# Patient Record
Sex: Female | Born: 1935 | Race: White | Hispanic: No | Smoking: Never smoker
Health system: Southern US, Community
[De-identification: ages and names within clinical notes are randomized; demographics above are authoritative.]

## PROBLEM LIST (undated history)

## (undated) DIAGNOSIS — I10 Essential (primary) hypertension: Secondary | ICD-10-CM

## (undated) HISTORY — PX: BREAST SURGERY: SHX581

## (undated) HISTORY — PX: APPENDECTOMY: SHX54

## (undated) HISTORY — PX: ABDOMINAL HYSTERECTOMY: SHX81

---

## 2008-05-25 ENCOUNTER — Other Ambulatory Visit: Payer: Self-pay

## 2008-05-25 ENCOUNTER — Ambulatory Visit: Payer: Self-pay | Admitting: Internal Medicine

## 2008-05-25 ENCOUNTER — Emergency Department: Payer: Self-pay | Admitting: Emergency Medicine

## 2008-05-29 ENCOUNTER — Other Ambulatory Visit: Payer: Self-pay

## 2008-05-29 ENCOUNTER — Emergency Department: Payer: Self-pay | Admitting: Emergency Medicine

## 2010-08-26 ENCOUNTER — Ambulatory Visit: Payer: Self-pay | Admitting: Family Medicine

## 2010-09-09 ENCOUNTER — Emergency Department: Payer: Self-pay | Admitting: Emergency Medicine

## 2011-09-19 ENCOUNTER — Ambulatory Visit: Payer: Self-pay

## 2012-07-04 ENCOUNTER — Ambulatory Visit: Payer: Self-pay | Admitting: Family Medicine

## 2012-07-04 LAB — URINALYSIS, COMPLETE
Bilirubin,UR: NEGATIVE
Ketone: NEGATIVE
Ph: 6 (ref 4.5–8.0)
Specific Gravity: 1.01 (ref 1.003–1.030)
WBC UR: >30 /HPF (ref 0–5)

## 2012-07-06 LAB — URINE CULTURE

## 2012-11-06 ENCOUNTER — Ambulatory Visit: Payer: Self-pay | Admitting: Internal Medicine

## 2012-11-06 LAB — URINALYSIS, COMPLETE
Ketone: NEGATIVE
Ph: 7.5 (ref 4.5–8.0)
Protein: NEGATIVE
Specific Gravity: 1.005 (ref 1.003–1.030)

## 2013-04-12 ENCOUNTER — Ambulatory Visit: Payer: Self-pay | Admitting: Family Medicine

## 2013-04-12 LAB — URINALYSIS, COMPLETE
Bacteria: NEGATIVE
Glucose,UR: NEGATIVE mg/dL (ref 0–75)
Ketone: NEGATIVE
Ph: 8 (ref 4.5–8.0)
Protein: NEGATIVE
Specific Gravity: 1.01 (ref 1.003–1.030)

## 2013-05-23 ENCOUNTER — Ambulatory Visit: Payer: Self-pay

## 2013-05-23 LAB — URINALYSIS, COMPLETE
Bacteria: NEGATIVE
RBC,UR: NONE SEEN /HPF (ref 0–5)

## 2013-05-25 LAB — URINE CULTURE

## 2013-07-08 ENCOUNTER — Ambulatory Visit: Payer: Self-pay | Admitting: Emergency Medicine

## 2013-07-08 LAB — URINALYSIS, COMPLETE
Bilirubin,UR: NEGATIVE
Glucose,UR: NEGATIVE mg/dL (ref 0–75)
Ketone: NEGATIVE
Ph: 6 (ref 4.5–8.0)

## 2013-07-10 LAB — URINE CULTURE

## 2015-01-05 ENCOUNTER — Emergency Department: Payer: Self-pay | Admitting: Emergency Medicine

## 2015-01-05 LAB — COMPREHENSIVE METABOLIC PANEL
ALBUMIN: 3.6 g/dL (ref 3.4–5.0)
ALK PHOS: 80 U/L
AST: 31 U/L (ref 15–37)
Anion Gap: 9 (ref 7–16)
BUN: 17 mg/dL (ref 7–18)
Bilirubin,Total: 0.4 mg/dL (ref 0.2–1.0)
CALCIUM: 9.3 mg/dL (ref 8.5–10.1)
Chloride: 108 mmol/L — ABNORMAL HIGH (ref 98–107)
Co2: 23 mmol/L (ref 21–32)
Creatinine: 1.04 mg/dL (ref 0.60–1.30)
EGFR (African American): >60
EGFR (Non-African Amer.): 53 — ABNORMAL LOW
Glucose: 120 mg/dL — ABNORMAL HIGH (ref 65–99)
OSMOLALITY: 282 (ref 275–301)
Potassium: 4.3 mmol/L (ref 3.5–5.1)
SGPT (ALT): 22 U/L
Sodium: 140 mmol/L (ref 136–145)
Total Protein: 7.2 g/dL (ref 6.4–8.2)

## 2015-01-05 LAB — CBC
HCT: 36.9 % (ref 35.0–47.0)
HGB: 12.1 g/dL (ref 12.0–16.0)
MCH: 29.8 pg (ref 26.0–34.0)
MCHC: 32.7 g/dL (ref 32.0–36.0)
MCV: 91 fL (ref 80–100)
Platelet: 229 10*3/uL (ref 150–440)
RBC: 4.05 10*6/uL (ref 3.80–5.20)
RDW: 13.3 % (ref 11.5–14.5)
WBC: 4.8 10*3/uL (ref 3.6–11.0)

## 2015-01-05 LAB — TROPONIN I: TROPONIN-I: 0.03 ng/mL

## 2017-12-01 ENCOUNTER — Other Ambulatory Visit: Payer: Self-pay

## 2017-12-01 ENCOUNTER — Ambulatory Visit
Admission: EM | Admit: 2017-12-01 | Discharge: 2017-12-01 | Disposition: A | Discharge status: Home / Self Care | Payer: Medicare Other | Attending: Family Medicine | Admitting: Family Medicine

## 2017-12-01 ENCOUNTER — Encounter: Payer: Self-pay | Admitting: *Deleted

## 2017-12-01 DIAGNOSIS — R040 Epistaxis: Secondary | ICD-10-CM | POA: Diagnosis not present

## 2017-12-01 HISTORY — DX: Essential (primary) hypertension: I10

## 2017-12-01 NOTE — ED Triage Notes (Signed)
Patient started having symptom of nose bleed and sinus congestion 3 days ago. Last nose bleed occurred this AM.  PAtient is presently not bleeding.

## 2017-12-01 NOTE — ED Provider Notes (Addendum)
[facility]    CSN: [id number] Arrival date & time: 12/01/17  1144     History   Chief Complaint Chief Complaint  Patient presents with  . Epistaxis    HPI Jani GravelBernice O Spare is a 83 y.o. female presented to clinic with daughter with cc of nose bleed every morning x 3 days. Nose bleed start when gets up in the morning and use pressure forabout 10-15 min to stop the bleeding. Pt reports had nasal congestion and used Afrin and nose bleed started after that.Denies headache, dizziness, change in vision, CP, SOB. Currently pt not bleeding. A&Ox3, comfortably communicating. No acute visible distress.  The history is provided by the patient.  Epistaxis  Location:  L nare Severity:  Mild Duration:  3 days Timing: once every morning  Progression:  Unchanged Chronicity:  New Relieved by:  Applying pressure Worsened by:  Heat   Past Medical History:  Diagnosis Date  . Hypertension     There are no active problems to display for this patient.   Past Surgical History:  Procedure Laterality Date  . ABDOMINAL HYSTERECTOMY    . APPENDECTOMY    . BREAST SURGERY      OB History    No data available       Home Medications    Prior to Admission medications   Medication Sig Start Date End Date Taking? Authorizing Provider  amLODipine (NORVASC) 10 MG tablet Take 10 mg by mouth daily.   Yes [provider]  carvedilol (COREG) 25 MG tablet Take 25 mg by mouth 2 (two) times daily with a meal.   Yes [provider]  cyanocobalamin 500 MCG tablet Take 500 mcg by mouth daily.   Yes [provider]  hydrALAZINE (APRESOLINE) 50 MG tablet Take 50 mg by mouth 3 (three) times daily.   Yes [provider]  Multiple Vitamin (MULTIVITAMIN) capsule Take 1 capsule by mouth daily.   Yes [provider]  Omega-3 Fatty Acids (FISH OIL PO) Take by mouth daily.   Yes [provider]  omeprazole (PRILOSEC OTC) 20 MG tablet Take 20 mg  by mouth daily.   Yes [provider]    Family History History reviewed. No pertinent family history.  Social History Social History   Tobacco Use  . Smoking status: Never Smoker  . Smokeless tobacco: Never Used  Substance Use Topics  . Alcohol use: No    Frequency: Never  . Drug use: No     Allergies   Aspirin   Review of Systems Review of Systems  Constitutional: Negative.   HENT: Positive for nosebleeds.   Eyes: Negative.   Respiratory: Negative.   Cardiovascular: Negative.   Neurological: Negative.   Hematological: Negative.   Psychiatric/Behavioral: Negative.      Physical Exam Triage Vital Signs ED Triage Vitals  Enc Vitals Group     BP 12/01/17 1159 (!) 155/58     Pulse Rate 12/01/17 1159 73     Resp 12/01/17 1159 16     Temp 12/01/17 1159 97.9 F (36.6 C)     Temp Source 12/01/17 1159 Oral     SpO2 12/01/17 1159 97 %     Weight 12/01/17 1200 134 lb (60.8 kg)     Height 12/01/17 1200 5\' 3"  (1.6 m)     Head Circumference --      Peak Flow --      Pain Score 12/01/17 1203 0     Pain Loc --  Pain Edu? --      Excl. in GC? --    No data found.  Updated Vital Signs BP (!) 155/58 (BP Location: Right Arm)   Pulse 73   Temp 97.9 F (36.6 C) (Oral)   Resp 16   Ht 5\' 3"  (1.6 m)   Wt 134 lb (60.8 kg)   SpO2 97%   BMI 23.74 kg/m   Visual Acuity Right Eye Distance:   Left Eye Distance:   Bilateral Distance:    Right Eye Near:   Left Eye Near:    Bilateral Near:     Physical Exam  Constitutional: She is oriented to person, place, and time. She appears well-developed and well-nourished. No distress.  HENT:  Nose: No mucosal edema, rhinorrhea, sinus tenderness or nasal deformity. Epistaxis (Left nare bleed x 3 days. Currently asymtomatic. Nasal passages dry. No blood or abrasion noted.) is observed.  No foreign bodies.  Eyes: EOM are normal. Pupils are equal, round, and reactive to light.  Neck: Normal range of motion.    Cardiovascular: Normal rate, regular rhythm and normal heart sounds.  Pulmonary/Chest: Effort normal and breath sounds normal.  Neurological: She is alert and oriented to person, place, and time.  Skin: Skin is warm.     UC Treatments / Results  Labs (all labs ordered are listed, but only abnormal results are displayed) Labs Reviewed - No data to display  EKG  EKG Interpretation None       Radiology No results found.  Procedures Procedures (including critical care time)  Medications Ordered in UC Medications - No data to display   Initial Impression / Assessment and Plan / UC Course  I have reviewed the triage vital signs and the nursing notes.  Pertinent labs & imaging results that were available during my care of the patient were reviewed by me and considered in my medical decision making (see chart for details).    Sx highly likely from use of Afrin and heat.  No acute findings on Exam. Will discharge pt home with instructions to use humidification, nasal lubricating gel. If Sx re appear return to clinic for re evaluation. Pt verbalizes understanding.  Final Clinical Impressions(s) / UC Diagnoses   Final diagnoses:  Epistaxis    ED Discharge Orders    None       Controlled Substance Prescriptions [place] Controlled Substance Registry consulted? Not Applicable   Reinaldo RaddleMultani, Annalaura Sauseda, NP 12/01/17 1240    Lanecia Sliva, NP 12/01/17 1241

## 2017-12-01 NOTE — Discharge Instructions (Signed)
Use humidification in bedroom. Use nasal lubricating gel. NO AFRIN or nasal spray. If Sx worsens return to clinic or go to ED.

## 2019-01-11 ENCOUNTER — Other Ambulatory Visit: Payer: Self-pay

## 2019-01-11 ENCOUNTER — Ambulatory Visit
Admission: EM | Admit: 2019-01-11 | Discharge: 2019-01-11 | Disposition: A | Discharge status: Home / Self Care | Payer: Medicare Other | Attending: Family Medicine | Admitting: Family Medicine

## 2019-01-11 DIAGNOSIS — J0100 Acute maxillary sinusitis, unspecified: Secondary | ICD-10-CM

## 2019-01-11 DIAGNOSIS — I10 Essential (primary) hypertension: Secondary | ICD-10-CM

## 2019-01-11 DIAGNOSIS — H109 Unspecified conjunctivitis: Secondary | ICD-10-CM | POA: Diagnosis not present

## 2019-01-11 MED ORDER — DOXYCYCLINE HYCLATE 100 MG PO CAPS: 100.0000 mg | ORAL_CAPSULE | Freq: Two times a day (BID) | ORAL | 0 refills | Status: DC

## 2019-01-11 MED ORDER — POLYMYXIN B-TRIMETHOPRIM 10000-0.1 UNIT/ML-% OP SOLN: 1.0000 [drp] | Freq: Four times a day (QID) | OPHTHALMIC | 0 refills | Status: AC

## 2019-01-11 NOTE — ED Provider Notes (Signed)
[facility]    CSN: [id number] Arrival date & time: 01/11/19  1226  History   Chief Complaint Cough, congestion, sinus pain/pressure, eye redness/discharge  HPI  85 year old female presents with the above complaints.  Patient states that she has been sick for 2 weeks.  She reports cough, sinus pain and pressure, congestion.  She states that her symptoms have failed to improve over the past 2 weeks.  He states that this morning she woke up and her eyes were red and there was discolored discharge.  No documented fever.  No known exacerbating or relieving factors.  Symptoms are severe and persistent.  No other associated symptoms.  No other complaints.  PMH, Surgical Hx, Family Hx, Social History reviewed and updated as below.  Past Medical History:  Diagnosis Date  . Hypertension   GERD  Past Surgical History:  Procedure Laterality Date  . ABDOMINAL HYSTERECTOMY    . APPENDECTOMY    . BREAST SURGERY     OB History   No obstetric history on file.    Home Medications    Prior to Admission medications   Medication Sig Start Date End Date Taking? Authorizing Provider  amLODipine (NORVASC) 10 MG tablet Take 10 mg by mouth daily.   Yes [provider]  carvedilol (COREG) 25 MG tablet Take 25 mg by mouth 2 (two) times daily with a meal.   Yes [provider]  cyanocobalamin 500 MCG tablet Take 500 mcg by mouth daily.   Yes [provider]  hydrALAZINE (APRESOLINE) 50 MG tablet Take 50 mg by mouth 3 (three) times daily.   Yes [provider]  Multiple Vitamin (MULTIVITAMIN) capsule Take 1 capsule by mouth daily.   Yes [provider]  Omega-3 Fatty Acids (FISH OIL PO) Take by mouth daily.   Yes [provider]  omeprazole (PRILOSEC OTC) 20 MG tablet Take 20 mg by mouth daily.   Yes [provider]  Turmeric 400 MG CAPS Take by mouth.   Yes [provider]  doxycycline (VIBRAMYCIN) 100 MG capsule  Take 1 capsule (100 mg total) by mouth 2 (two) times daily. 01/11/19   Tommie Sams, DO  trimethoprim-polymyxin b (POLYTRIM) ophthalmic solution Place 1 drop into both eyes every 6 (six) hours for 5 days. 01/11/19 01/16/19  Tommie Sams, DO   Social History Social History   Tobacco Use  . Smoking status: Never Smoker  . Smokeless tobacco: Never Used  Substance Use Topics  . Alcohol use: No    Frequency: Never  . Drug use: No   Allergies   Aspirin   Review of Systems Review of Systems  Constitutional: Negative for fever.  HENT: Positive for congestion, sinus pressure and sinus pain.   Eyes: Positive for discharge and redness.  Respiratory: Positive for cough.    Physical Exam Triage Vital Signs ED Triage Vitals  Enc Vitals Group     BP 01/11/19 1238 (!) 142/60     Pulse Rate 01/11/19 1238 77     Resp 01/11/19 1238 18     Temp 01/11/19 1238 98.3 F (36.8 C)     Temp Source 01/11/19 1238 Oral     SpO2 01/11/19 1238 98 %     Weight 01/11/19 1236 130 lb (59 kg)     Height 01/11/19 1236 5\' 3"  (1.6 m)     Head Circumference --      Peak Flow --      Pain Score 01/11/19 1236  1     Pain Loc --      Pain Edu? --      Excl. in GC? --    Updated Vital Signs BP (!) 142/60 (BP Location: Right Arm)   Pulse 77   Temp 98.3 F (36.8 C) (Oral)   Resp 18   Ht 5\' 3"  (1.6 m)   Wt 59 kg   SpO2 98%   BMI 23.03 kg/m   Visual Acuity Right Eye Distance:   Left Eye Distance:   Bilateral Distance:    Right Eye Near:   Left Eye Near:    Bilateral Near:     Physical Exam Vitals signs and nursing note reviewed.  Constitutional:      General: She is not in acute distress. HENT:     Head: Normocephalic and atraumatic.     Nose:     Comments: Left maxillary sinus tenderness to palpation.    Mouth/Throat:     Pharynx: Oropharynx is clear. No posterior oropharyngeal erythema.  Eyes:     Comments: Bilateral conjunctival injection with green discharge  Cardiovascular:     Rate  and Rhythm: Normal rate and regular rhythm.  Pulmonary:     Effort: Pulmonary effort is normal.     Breath sounds: No wheezing, rhonchi or rales.  Neurological:     Mental Status: She is alert.  Psychiatric:        Mood and Affect: Mood normal.        Behavior: Behavior normal.    UC Treatments / Results  Labs (all labs ordered are listed, but only abnormal results are displayed) Labs Reviewed - No data to display  EKG None  Radiology No results found.  Procedures Procedures (including critical care time)  Medications Ordered in UC Medications - No data to display  Initial Impression / Assessment and Plan / UC Course  I have reviewed the triage vital signs and the nursing notes.  Pertinent labs & imaging results that were available during my care of the patient were reviewed by me and considered in my medical decision making (see chart for details).    85 year old female presents with sinusitis and conjunctivitis.  Treating with doxycycline and Polytrim.  Final Clinical Impressions(s) / UC Diagnoses   Final diagnoses:  Acute maxillary sinusitis, recurrence not specified  Conjunctivitis of both eyes, unspecified conjunctivitis type     Discharge Instructions     Medication as prescribed.  Take antibiotic with food.  Take care  Dr. Adriana Simas     ED Prescriptions    Medication Sig Dispense Auth. Provider   doxycycline (VIBRAMYCIN) 100 MG capsule Take 1 capsule (100 mg total) by mouth 2 (two) times daily. 14 capsule Harshaan Whang G, DO   trimethoprim-polymyxin b (POLYTRIM) ophthalmic solution Place 1 drop into both eyes every 6 (six) hours for 5 days. 10 mL Tommie Sams, DO     Controlled Substance Prescriptions [place] Controlled Substance Registry consulted? Not Applicable   Tommie Sams, DO 01/11/19 1459

## 2019-01-11 NOTE — Discharge Instructions (Signed)
Medication as prescribed.  Take antibiotic with food.  Take care  Dr. Adriana Simas

## 2019-01-11 NOTE — ED Triage Notes (Signed)
Patient complains of cough, congestion, sinus pain and pressure, eyes have been goopy. States that symptoms started 2 weeks ago.

## 2021-12-22 ENCOUNTER — Encounter: Payer: Self-pay | Admitting: Emergency Medicine

## 2021-12-22 ENCOUNTER — Other Ambulatory Visit: Payer: Self-pay

## 2021-12-22 ENCOUNTER — Inpatient Hospital Stay
Admission: EM | Admit: 2021-12-22 | Discharge: 2021-12-28 | DRG: 481 | Disposition: A | Discharge status: Skilled Nursing Facility | Payer: Medicare PPO | Attending: Internal Medicine | Admitting: Internal Medicine

## 2021-12-22 ENCOUNTER — Emergency Department: Payer: Medicare PPO

## 2021-12-22 DIAGNOSIS — Z79899 Other long term (current) drug therapy: Secondary | ICD-10-CM

## 2021-12-22 DIAGNOSIS — S72145A Nondisplaced intertrochanteric fracture of left femur, initial encounter for closed fracture: Secondary | ICD-10-CM | POA: Diagnosis not present

## 2021-12-22 DIAGNOSIS — S72009A Fracture of unspecified part of neck of unspecified femur, initial encounter for closed fracture: Secondary | ICD-10-CM

## 2021-12-22 DIAGNOSIS — M1712 Unilateral primary osteoarthritis, left knee: Secondary | ICD-10-CM | POA: Diagnosis present

## 2021-12-22 DIAGNOSIS — Z886 Allergy status to analgesic agent status: Secondary | ICD-10-CM | POA: Diagnosis not present

## 2021-12-22 DIAGNOSIS — Z79890 Hormone replacement therapy (postmenopausal): Secondary | ICD-10-CM | POA: Diagnosis not present

## 2021-12-22 DIAGNOSIS — Z20822 Contact with and (suspected) exposure to covid-19: Secondary | ICD-10-CM | POA: Diagnosis present

## 2021-12-22 DIAGNOSIS — W19XXXA Unspecified fall, initial encounter: Secondary | ICD-10-CM

## 2021-12-22 DIAGNOSIS — D638 Anemia in other chronic diseases classified elsewhere: Secondary | ICD-10-CM | POA: Diagnosis present

## 2021-12-22 DIAGNOSIS — Z419 Encounter for procedure for purposes other than remedying health state, unspecified: Secondary | ICD-10-CM

## 2021-12-22 DIAGNOSIS — S7292XA Unspecified fracture of left femur, initial encounter for closed fracture: Secondary | ICD-10-CM

## 2021-12-22 DIAGNOSIS — E039 Hypothyroidism, unspecified: Secondary | ICD-10-CM | POA: Diagnosis present

## 2021-12-22 DIAGNOSIS — I700 Atherosclerosis of aorta: Secondary | ICD-10-CM | POA: Diagnosis present

## 2021-12-22 DIAGNOSIS — W010XXA Fall on same level from slipping, tripping and stumbling without subsequent striking against object, initial encounter: Secondary | ICD-10-CM | POA: Diagnosis present

## 2021-12-22 DIAGNOSIS — S72142A Displaced intertrochanteric fracture of left femur, initial encounter for closed fracture: Principal | ICD-10-CM | POA: Diagnosis present

## 2021-12-22 DIAGNOSIS — D62 Acute posthemorrhagic anemia: Secondary | ICD-10-CM | POA: Diagnosis not present

## 2021-12-22 DIAGNOSIS — G8929 Other chronic pain: Secondary | ICD-10-CM | POA: Diagnosis present

## 2021-12-22 DIAGNOSIS — I10 Essential (primary) hypertension: Secondary | ICD-10-CM | POA: Diagnosis present

## 2021-12-22 DIAGNOSIS — Z66 Do not resuscitate: Secondary | ICD-10-CM | POA: Diagnosis present

## 2021-12-22 DIAGNOSIS — Z90710 Acquired absence of both cervix and uterus: Secondary | ICD-10-CM

## 2021-12-22 DIAGNOSIS — K219 Gastro-esophageal reflux disease without esophagitis: Secondary | ICD-10-CM | POA: Diagnosis present

## 2021-12-22 LAB — COMPREHENSIVE METABOLIC PANEL
ALT: 16 U/L (ref 0–44)
AST: 22 U/L (ref 15–41)
Albumin: 3.7 g/dL (ref 3.5–5.0)
Alkaline Phosphatase: 57 U/L (ref 38–126)
Anion gap: 5 (ref 5–15)
BUN: 30 mg/dL — ABNORMAL HIGH (ref 8–23)
CO2: 22 mmol/L (ref 22–32)
Calcium: 8.7 mg/dL — ABNORMAL LOW (ref 8.9–10.3)
Chloride: 108 mmol/L (ref 98–111)
Creatinine, Ser: 1.27 mg/dL — ABNORMAL HIGH (ref 0.44–1.00)
GFR, Estimated: 39 mL/min — ABNORMAL LOW (ref >60)
Glucose, Bld: 120 mg/dL — ABNORMAL HIGH (ref 70–99)
Potassium: 4 mmol/L (ref 3.5–5.1)
Sodium: 135 mmol/L (ref 135–145)
Total Bilirubin: 0.8 mg/dL (ref 0.3–1.2)
Total Protein: 6.6 g/dL (ref 6.5–8.1)

## 2021-12-22 LAB — CBC WITH DIFFERENTIAL/PLATELET
Abs Immature Granulocytes: 0.03 10*3/uL (ref 0.00–0.07)
Basophils Absolute: 0.1 10*3/uL (ref 0.0–0.1)
Basophils Relative: 1 %
Eosinophils Absolute: 0.1 10*3/uL (ref 0.0–0.5)
Eosinophils Relative: 1 %
HCT: 33.8 % — ABNORMAL LOW (ref 36.0–46.0)
Hemoglobin: 11.1 g/dL — ABNORMAL LOW (ref 12.0–15.0)
Immature Granulocytes: 0 %
Lymphocytes Relative: 14 %
Lymphs Abs: 1.3 10*3/uL (ref 0.7–4.0)
MCH: 31.2 pg (ref 26.0–34.0)
MCHC: 32.8 g/dL (ref 30.0–36.0)
MCV: 94.9 fL (ref 80.0–100.0)
Monocytes Absolute: 0.4 10*3/uL (ref 0.1–1.0)
Monocytes Relative: 4 %
Neutro Abs: 7.7 10*3/uL (ref 1.7–7.7)
Neutrophils Relative %: 80 %
Platelets: 204 10*3/uL (ref 150–400)
RBC: 3.56 MIL/uL — ABNORMAL LOW (ref 3.87–5.11)
RDW: 12.9 % (ref 11.5–15.5)
WBC: 9.7 10*3/uL (ref 4.0–10.5)
nRBC: 0 % (ref 0.0–0.2)

## 2021-12-22 LAB — RESP PANEL BY RT-PCR (FLU A&B, COVID) ARPGX2
Influenza A by PCR: NEGATIVE
Influenza B by PCR: NEGATIVE
SARS Coronavirus 2 by RT PCR: NEGATIVE

## 2021-12-22 LAB — TYPE AND SCREEN
ABO/RH(D): O NEG
Antibody Screen: NEGATIVE

## 2021-12-22 MED ORDER — CARVEDILOL 25 MG PO TABS
25.0000 mg | ORAL_TABLET | Freq: Two times a day (BID) | ORAL | Status: DC
  Administered 2021-12-23 – 2021-12-28 (×10): 25 mg via ORAL
  Filled 2021-12-22 (×10): qty 1

## 2021-12-22 MED ORDER — MORPHINE SULFATE (PF) 2 MG/ML IV SOLN
0.5000 mg | INTRAVENOUS | Status: DC | PRN
  Administered 2021-12-23: 09:00:00 0.5 mg via INTRAVENOUS
  Filled 2021-12-22: qty 1

## 2021-12-22 MED ORDER — ONDANSETRON HCL 4 MG/2ML IJ SOLN
4.0000 mg | Freq: Once | INTRAMUSCULAR | Status: AC
  Administered 2021-12-22: 20:00:00 4 mg via INTRAVENOUS
  Filled 2021-12-22: qty 2

## 2021-12-22 MED ORDER — SODIUM CHLORIDE 0.9 % IV SOLN: Freq: Once | INTRAVENOUS | Status: AC

## 2021-12-22 MED ORDER — MORPHINE SULFATE (PF) 2 MG/ML IV SOLN
2.0000 mg | INTRAVENOUS | Status: DC | PRN
  Administered 2021-12-22: 22:00:00 2 mg via INTRAVENOUS
  Filled 2021-12-22: qty 1

## 2021-12-22 MED ORDER — LABETALOL HCL 5 MG/ML IV SOLN: 5.0000 mg | INTRAVENOUS | Status: AC | PRN

## 2021-12-22 MED ORDER — AMLODIPINE BESYLATE 10 MG PO TABS
10.0000 mg | ORAL_TABLET | Freq: Every day | ORAL | Status: DC
  Administered 2021-12-24 – 2021-12-28 (×5): 10 mg via ORAL
  Filled 2021-12-22 (×5): qty 1

## 2021-12-22 MED ORDER — HYDRALAZINE HCL 50 MG PO TABS
50.0000 mg | ORAL_TABLET | Freq: Once | ORAL | Status: AC
  Administered 2021-12-22: 20:00:00 50 mg via ORAL
  Filled 2021-12-22: qty 1

## 2021-12-22 MED ORDER — HYDRALAZINE HCL 50 MG PO TABS
25.0000 mg | ORAL_TABLET | Freq: Three times a day (TID) | ORAL | Status: DC
  Administered 2021-12-23 – 2021-12-26 (×11): 25 mg via ORAL
  Filled 2021-12-22 (×12): qty 1

## 2021-12-22 MED ORDER — MORPHINE SULFATE (PF) 2 MG/ML IV SOLN
2.0000 mg | Freq: Once | INTRAVENOUS | Status: AC
  Administered 2021-12-22: 20:00:00 2 mg via INTRAVENOUS
  Filled 2021-12-22: qty 1

## 2021-12-22 MED ORDER — PANTOPRAZOLE SODIUM 20 MG PO TBEC
20.0000 mg | DELAYED_RELEASE_TABLET | Freq: Every day | ORAL | Status: DC
  Administered 2021-12-24 – 2021-12-28 (×5): 20 mg via ORAL
  Filled 2021-12-22 (×6): qty 1

## 2021-12-22 MED ORDER — HYDROCODONE-ACETAMINOPHEN 5-325 MG PO TABS
1.0000 | ORAL_TABLET | Freq: Four times a day (QID) | ORAL | Status: DC | PRN
  Administered 2021-12-23: 01:00:00 2 via ORAL
  Filled 2021-12-22: qty 2

## 2021-12-22 MED ORDER — ACETAMINOPHEN 500 MG PO TABS
1000.0000 mg | ORAL_TABLET | Freq: Once | ORAL | Status: AC
  Administered 2021-12-22: 20:00:00 1000 mg via ORAL
  Filled 2021-12-22: qty 2

## 2021-12-22 MED ORDER — HEPARIN SODIUM (PORCINE) 5000 UNIT/ML IJ SOLN
5000.0000 [IU] | Freq: Three times a day (TID) | INTRAMUSCULAR | Status: AC
  Administered 2021-12-23: 01:00:00 5000 [IU] via SUBCUTANEOUS
  Filled 2021-12-22: qty 1

## 2021-12-22 MED ORDER — CARVEDILOL 25 MG PO TABS
25.0000 mg | ORAL_TABLET | Freq: Once | ORAL | Status: AC
  Administered 2021-12-22: 20:00:00 25 mg via ORAL
  Filled 2021-12-22: qty 1

## 2021-12-22 NOTE — ED Provider Notes (Signed)
[facility] Emergency Department Provider Note    Event Date/Time   First MD Initiated Contact with Patient 12/22/21 1948     (approximate)  I have reviewed the triage vital signs and the nursing notes.   HISTORY  Chief Complaint Hip pain    HPI Daisy Mora is a 87 y.o. female presents after mechanical fall occurred at home today.  Has issues with chronic left knee pain and felt like her left knee gave out.  She did not strike her head no neck pain but is not been able to ambulate due to left hip pain.  She arrives via EMS her left leg is shortened and internally rotated.  She is not on any blood thinners.  Does have a history of hypertension but no other past medical history.  Past Medical History:  Diagnosis Date   Hypertension    No family history on file. Past Surgical History:  Procedure Laterality Date   ABDOMINAL HYSTERECTOMY     APPENDECTOMY     BREAST SURGERY     Patient Active Problem List   Diagnosis Date Noted   Femur fracture, left (HCC) 12/22/2021   Essential hypertension 12/22/2021   GERD (gastroesophageal reflux disease) 12/22/2021      Prior to Admission medications   Medication Sig Start Date End Date Taking? Authorizing Provider  amLODipine (NORVASC) 10 MG tablet Take 10 mg by mouth daily.    [provider]  carvedilol (COREG) 25 MG tablet Take 25 mg by mouth 2 (two) times daily with a meal.    [provider]  cyanocobalamin 500 MCG tablet Take 500 mcg by mouth daily.    [provider]  doxycycline (VIBRAMYCIN) 100 MG capsule Take 1 capsule (100 mg total) by mouth 2 (two) times daily. 01/11/19   Tommie Sams, DO  hydrALAZINE (APRESOLINE) 50 MG tablet Take 50 mg by mouth 3 (three) times daily.    [provider]  Multiple Vitamin (MULTIVITAMIN) capsule Take 1 capsule by mouth daily.    [provider]  Omega-3 Fatty Acids (FISH OIL PO) Take by mouth daily.    [provider]  omeprazole (PRILOSEC OTC) 20 MG tablet Take 20 mg by mouth daily.    [provider]  Turmeric 400 MG CAPS Take by mouth.    [provider]    Allergies Aspirin    Social History Social History   Tobacco Use   Smoking status: Never   Smokeless tobacco: Never  Vaping Use   Vaping Use: Never used  Substance Use Topics   Alcohol use: No   Drug use: No    Review of Systems Patient denies headaches, rhinorrhea, blurry vision, numbness, shortness of breath, chest pain, edema, cough, abdominal pain, nausea, vomiting, diarrhea, dysuria, fevers, rashes or hallucinations unless otherwise stated above in HPI. ____________________________________________   PHYSICAL EXAM:  VITAL SIGNS: Vitals:   12/22/21 1831 12/22/21 1937  BP: (!) 180/64 (!) 193/66  Pulse: 61 79  Resp: 16   Temp: 97.9 F (36.6 C)   SpO2: 95%     Constitutional: Alert and oriented.  Eyes: Conjunctivae are normal.  Head: Atraumatic. Nose: No congestion/rhinnorhea. Mouth/Throat: Mucous membranes are moist.   Neck: No stridor. Painless ROM.  Cardiovascular: Normal rate, regular rhythm. Grossly normal heart sounds.  Good peripheral circulation. Respiratory: Normal respiratory effort.  No retractions. Lungs CTAB. Gastrointestinal: Soft and nontender. No distention. No abdominal bruits. No CVA tenderness. Genitourinary:  Musculoskeletal: pain  with log roll on left.  Left le shortened and internally rotated.  No joint effusions. Neurologic:  Normal speech and language. No gross focal neurologic deficits are appreciated. No facial droop Skin:  Skin is warm, dry and intact. No rash noted. Psychiatric: Mood and affect are normal. Speech and behavior are normal.  ____________________________________________   LABS (all labs ordered are listed, but only abnormal results are displayed)  Results for orders placed or performed during the hospital encounter of 12/22/21 (from the past  24 hour(s))  CBC with Differential     Status: Abnormal   Collection Time: 12/22/21  7:45 PM  Result Value Ref Range   WBC 9.7 4.0 - 10.5 K/uL   RBC 3.56 (L) 3.87 - 5.11 MIL/uL   Hemoglobin 11.1 (L) 12.0 - 15.0 g/dL   HCT 43.1 (L) 54.0 - 08.6 %   MCV 94.9 80.0 - 100.0 fL   MCH 31.2 26.0 - 34.0 pg   MCHC 32.8 30.0 - 36.0 g/dL   RDW 76.1 95.0 - 93.2 %   Platelets 204 150 - 400 K/uL   nRBC 0.0 0.0 - 0.2 %   Neutrophils Relative % 80 %   Neutro Abs 7.7 1.7 - 7.7 K/uL   Lymphocytes Relative 14 %   Lymphs Abs 1.3 0.7 - 4.0 K/uL   Monocytes Relative 4 %   Monocytes Absolute 0.4 0.1 - 1.0 K/uL   Eosinophils Relative 1 %   Eosinophils Absolute 0.1 0.0 - 0.5 K/uL   Basophils Relative 1 %   Basophils Absolute 0.1 0.0 - 0.1 K/uL   Immature Granulocytes 0 %   Abs Immature Granulocytes 0.03 0.00 - 0.07 K/uL  Comprehensive metabolic panel     Status: Abnormal   Collection Time: 12/22/21  7:45 PM  Result Value Ref Range   Sodium 135 135 - 145 mmol/L   Potassium 4.0 3.5 - 5.1 mmol/L   Chloride 108 98 - 111 mmol/L   CO2 22 22 - 32 mmol/L   Glucose, Bld 120 (H) 70 - 99 mg/dL   BUN 30 (H) 8 - 23 mg/dL   Creatinine, Ser 6.71 (H) 0.44 - 1.00 mg/dL   Calcium 8.7 (L) 8.9 - 10.3 mg/dL   Total Protein 6.6 6.5 - 8.1 g/dL   Albumin 3.7 3.5 - 5.0 g/dL   AST 22 15 - 41 U/L   ALT 16 0 - 44 U/L   Alkaline Phosphatase 57 38 - 126 U/L   Total Bilirubin 0.8 0.3 - 1.2 mg/dL   GFR, Estimated 39 (L) >60 mL/min   Anion gap 5 5 - 15  Type and screen [facility]     Status: None   Collection Time: 12/22/21  7:45 PM  Result Value Ref Range   ABO/RH(D) O NEG    Antibody Screen NEG    Sample Expiration      12/25/2021,2359 Performed at [facility], [address] [place], [place] [address]   Resp Panel by RT-PCR (Flu A&B, Covid) Nasopharyngeal Swab     Status: None   Collection Time: 12/22/21  7:45 PM   Specimen: Nasopharyngeal Swab; Nasopharyngeal(NP) swabs in vial  transport medium  Result Value Ref Range   SARS Coronavirus 2 by RT PCR NEGATIVE NEGATIVE   Influenza A by PCR NEGATIVE NEGATIVE   Influenza B by PCR NEGATIVE NEGATIVE   ____________________________________________  EKG My review and personal interpretation at Time: 20:54   Indication: fall  Rate: 60  Rhythm: sinus Axis:  normal Other: normal intervals,  no stemi, ____________________________________________  RADIOLOGY  I personally reviewed all radiographic images ordered to evaluate for the above acute complaints and reviewed radiology reports and findings.  These findings were personally discussed with the patient.  Please see medical record for radiology report.  ____________________________________________   PROCEDURES  Procedure(s) performed:  Procedures    Critical Care performed: no ____________________________________________   INITIAL IMPRESSION / ASSESSMENT AND PLAN / ED COURSE  Pertinent labs & imaging results that were available during my care of the patient were reviewed by me and considered in my medical decision making (see chart for details).   DDX: fracture, contusion, dislocation, msk strain  RAIANNA SLIGHT is a 87 y.o. who presents to the ED with acute hip pain and head injury as described above.  CT imaging ordered at triage is reassuring.  X-ray shows evidence of acute left intertrochanteric fracture.  Patient given IV fluids.  IV pain medication including IV morphine.  Discussed case in consultation with Dr. Martha Clan of orthopedics.  Patient will be admitted to hospitalist service.     The patient was evaluated in Emergency Department today for the symptoms described in the history of present illness. He/she was evaluated in the context of the global COVID-19 pandemic, which necessitated consideration that the patient might be at risk for infection with the SARS-CoV-2 virus that causes COVID-19. Institutional protocols and algorithms that pertain to  the evaluation of patients at risk for COVID-19 are in a state of rapid change based on information released by regulatory bodies including the CDC and federal and state organizations. These policies and algorithms were followed during the patient's care in the ED.  As part of my medical decision making, I reviewed the following data within the electronic MEDICAL RECORD NUMBER Nursing notes reviewed and incorporated, Labs reviewed, notes from prior ED visits and [place] Controlled Substance Database   ____________________________________________   FINAL CLINICAL IMPRESSION(S) / ED DIAGNOSES  Final diagnoses:  Closed displaced intertrochanteric fracture of left femur, initial encounter (HCC)      NEW MEDICATIONS STARTED DURING THIS VISIT:  New Prescriptions   No medications on file     Note:  This document was prepared using Dragon voice recognition software and may include unintentional dictation errors.    Willy Eddy, MD 12/22/21 [phone]

## 2021-12-22 NOTE — ED Triage Notes (Signed)
First Nurse note:  Arrives via [facility]. .  Mechanical fall, fell today in home while stepping up a step, fell backward.  C/O left hip pain.  Per EMS report, no deformity. VS wnl.

## 2021-12-22 NOTE — ED Notes (Signed)
Pt brief changed and put into gown.

## 2021-12-22 NOTE — H&P (Addendum)
History and Physical   CAOIMHE Mora SHF:[id number] DOB: December 11, 1926 DOA: 12/22/2021  PCP: Laren Everts, NP Outpatient Specialists: Dr. Gwen Pounds Patient coming from: home   I have personally briefly reviewed patient's old medical records in [facility] EMR.  Chief Concern: Fall with left hip pain.  HPI: Daisy Mora is a 87 y.o. female with medical history significant for hypertension, GERD, who presents emergency department for chief concerns of a fall.  At bedside, patient was able to tell me her full name, her age, she knows she is in the hospital, and she is able to tell me the name of her daughter, Elita Quick, who is at bedside.  She does not appear to be in acute distress.  She states that she was in the laundry room with a handful of 1 and she was ambulating from the laundry to the kitchen.  There was a step up between the laundry room in the kitchen and she tripped, lost her balance, with a low 1200 and arms.  She reports that when she fell she did not hit her head, loss of consciousness, or pass out.  She states that the pain was not immediate.  She states that when she attempts to ambulate or get up and or bear weight, the pain is a 10 out of 10.  At bedside she states that the pain is about a 3-4 out of 10.  Social history: She lives by herself.  She denies history of tobacco, EtOH, recreational drug use.  She is retired and formerly worked for the city of [employer].  ROS: Constitutional: no weight change, no fever ENT/Mouth: no sore throat, no rhinorrhea Eyes: no eye pain, no vision changes Cardiovascular: no chest pain, no dyspnea,  no edema, no palpitations Respiratory: no cough, no sputum, no wheezing Gastrointestinal: no nausea, no vomiting, no diarrhea, no constipation Genitourinary: no urinary incontinence, no dysuria, no hematuria Musculoskeletal: no arthralgias, no myalgias, + left hip pain Skin: no skin lesions, no pruritus, Neuro: + weakness, no loss of  consciousness, no syncope Psych: no anxiety, no depression, no decrease appetite Heme/Lymph: no bruising, no bleeding  ED Course: Discussed with emergency medicine provider, patient requiring hospitalization for chief concerns of acute intertrochanteric fracture of the left femur.  Vitals in the emergency department showed temperature of 97.9, respiration rate of 16, heart rate of 61, initial blood pressure 180/64, and increased to 193/66, SPO2 of 95% on room air.  Labs in the emergency department showed serum sodium 135, potassium 4.0, chloride 108, bicarb 22, BUN of 30, serum creatinine of 1.27, nonfasting blood glucose 120, GFR of 39.  WBC was 9.7, hemoglobin 11.1, platelets 204.  COVID/influenza a/influenza B PCR were negative.  In the emergency department patient received Tylenol 1000 mg p.o., Coreg 25 mg p.o., hydralazine 50 mg p.o., morphine 2 mg IV, ondansetron 4 mg IV.  Per EDP, Dr. Martha Clan has been consulted and states that he would take the patient to the OR tomorrow.  Assessment/Plan  Principal Problem:   Femur fracture, left (HCC) Active Problems:   Essential hypertension   GERD (gastroesophageal reflux disease)   Acquired hypothyroidism   # Acute comminuted intertrochanteric fracture of the left proximal femur-presumed secondary to mechanical fall - Fall precautions - Heart healthy diet now - N.p.o. after midnight except for sips with meds and ice chips - Orthopedic service has been consulted, Dr. Martha Clan - Pain control with hydrocodone-acetaminophen 5-325 mg tablets every 6 hours.  For moderate pain; morphine 0.5 mg IV  every 2 hours as needed for severe pain  # Hypertension-not controlled, presumed secondary to pain - Status post Coreg 25 mg p.o., hydralazine 50 mg p.o. per EDP - Resumed home Coreg 25 mg p.o. twice daily, amlodipine 10 mg daily, hydralazine 50 mg 3 times daily - Labetalol 5 mg IV every 2 hours as needed for SBP greater than 180, 1 day  ordered  # Hypothyroid-patient takes levothyroxine 25 mcg daily # GERD-PPI resumed  Chart reviewed.   DVT prophylaxis: Heparin 5000 units, subcutaneous, 2 every 8 hours, 1 dose ordered Code Status: DNR/DNI Diet: Heart healthy, n.p.o. after midnight Family Communication: Updated daughter, Pam at bedside Disposition Plan: Pending clinical course Consults called: Orthopedic Admission status: MedSurg, inpatient, no telemetry  Past Medical History:  Diagnosis Date   Hypertension    Past Surgical History:  Procedure Laterality Date   ABDOMINAL HYSTERECTOMY     APPENDECTOMY     BREAST SURGERY     Social History:  reports that she has never smoked. She has never used smokeless tobacco. She reports that she does not drink alcohol and does not use drugs.  Allergies  Allergen Reactions   Aspirin Tinitus   History reviewed. No pertinent family history. Family history: Family history reviewed and not pertinent.  Prior to Admission medications   Medication Sig Start Date End Date Taking? Authorizing Provider  amLODipine (NORVASC) 10 MG tablet Take 10 mg by mouth daily.    [provider]  carvedilol (COREG) 25 MG tablet Take 25 mg by mouth 2 (two) times daily with a meal.    [provider]  cyanocobalamin 500 MCG tablet Take 500 mcg by mouth daily.    [provider]  doxycycline (VIBRAMYCIN) 100 MG capsule Take 1 capsule (100 mg total) by mouth 2 (two) times daily. 01/11/19   Coral Spikes, DO  hydrALAZINE (APRESOLINE) 50 MG tablet Take 50 mg by mouth 3 (three) times daily.    [provider]  Multiple Vitamin (MULTIVITAMIN) capsule Take 1 capsule by mouth daily.    [provider]  Omega-3 Fatty Acids (FISH OIL PO) Take by mouth daily.    [provider]  omeprazole (PRILOSEC OTC) 20 MG tablet Take 20 mg by mouth daily.    [provider]  Turmeric 400 MG CAPS Take by mouth.    [provider]   Physical  Exam: Vitals:   12/22/21 1745 12/22/21 1831 12/22/21 1937 12/22/21 2202  BP:  (!) 180/64 (!) 193/66 (!) 145/54  Pulse:  61 79 60  Resp:  16  17  Temp:  97.9 F (36.6 C)    TempSrc:  Oral    SpO2:  95%  94%  Weight: 59 kg     Height: 5\' 3"  (1.6 m)      Constitutional: appears younger than chronological age, NAD, calm, comfortable Eyes: PERRL, lids and conjunctivae normal ENMT: Mucous membranes are moist. Posterior pharynx clear of any exudate or lesions. Age-appropriate dentition. Hearing appropriate Neck: normal, supple, no masses, no thyromegaly Respiratory: clear to auscultation bilaterally, no wheezing, no crackles. Normal respiratory effort. No accessory muscle use.  Cardiovascular: Regular rate and rhythm, no murmurs / rubs / gallops. No extremity edema. 2+ pedal pulses. No carotid bruits.  Abdomen: no tenderness, no masses palpated, no hepatosplenomegaly. Bowel sounds positive.  Musculoskeletal: no clubbing / cyanosis. No joint deformity upper and lower extremities. Good ROM, no contractures, no atrophy. Normal muscle tone.  Decreased range of motion of the left  lower extremity. Skin: no rashes, lesions, ulcers. No induration Neurologic: Sensation intact. Strength 5/5 in all 4.  Psychiatric: Normal judgment and insight. Alert and oriented x 3. Normal mood.   EKG: Ordered  Chest x-ray on Admission: I personally reviewed and I agree with radiologist reading as below.  DG Chest 1 View  Result Date: 12/22/2021 CLINICAL DATA:  Left hip pain.  Status post fall. EXAM: CHEST  1 VIEW COMPARISON:  01/05/2015 FINDINGS: The heart size and mediastinal contours are within normal limits. Aortic atherosclerotic calcifications. Both lungs are clear. The visualized skeletal structures are unremarkable. IMPRESSION: No active disease. Electronically Signed   By: Kerby Moors M.D.   On: 12/22/2021 19:09   CT HEAD WO CONTRAST (5MM)  Result Date: 12/22/2021 CLINICAL DATA:  [place] backwards, head  trauma EXAM: CT HEAD WITHOUT CONTRAST TECHNIQUE: Contiguous axial images were obtained from the base of the skull through the vertex without intravenous contrast. COMPARISON:  None. FINDINGS: Brain: No acute infarct or hemorrhage. Lateral ventricles and midline structures are unremarkable. No acute extra-axial fluid collections. No mass effect. Vascular: No hyperdense vessel or unexpected calcification. Skull: Normal. Negative for fracture or focal lesion. Sinuses/Orbits: No acute finding. Other: None. IMPRESSION: 1. No acute intracranial process. Electronically Signed   By: Randa Ngo M.D.   On: 12/22/2021 20:13   CT Cervical Spine Wo Contrast  Result Date: 12/22/2021 CLINICAL DATA:  [place] backwards, neck trauma EXAM: CT CERVICAL SPINE WITHOUT CONTRAST TECHNIQUE: Multidetector CT imaging of the cervical spine was performed without intravenous contrast. Multiplanar CT image reconstructions were also generated. COMPARISON:  None. FINDINGS: Alignment: Minimal anterolisthesis of C3 relative to C4 likely due to degenerative change. Otherwise alignment is anatomic. Skull base and vertebrae: No acute fracture. No primary bone lesion or focal pathologic process. Soft tissues and spinal canal: No prevertebral fluid or swelling. No visible canal hematoma. Disc levels: Multilevel cervical spondylosis greatest at C4-5, C5-6, and C6-7. Multilevel facet hypertrophy greatest at C3-4. Bony fusion across the C4-5 facet joints on the left. Upper chest: Central airway is patent.  Lung apices are clear. Other: Reconstructed images demonstrate no additional findings. IMPRESSION: 1. No acute cervical spine fracture. 2. Multilevel degenerative changes as above. Electronically Signed   By: Randa Ngo M.D.   On: 12/22/2021 20:11   DG Hip Unilat W or Wo Pelvis 2-3 Views Left  Result Date: 12/22/2021 CLINICAL DATA:  Status post fall EXAM: DG HIP (WITH OR WITHOUT PELVIS) 2-3V LEFT COMPARISON:  None. FINDINGS: Bones are  diffusely osteopenic. There is an acute, comminuted intertrochanteric fracture involving the proximal left femur. Mild impaction of the fracture fragments identified. Large stool burden is incidentally noted within the colon and rectum. IMPRESSION: Acute, comminuted intertrochanteric fracture of the proximal left femur. Electronically Signed   By: Kerby Moors M.D.   On: 12/22/2021 19:08    Labs on Admission: I have personally reviewed following labs  CBC: Recent Labs  Lab 12/22/21 1945  WBC 9.7  NEUTROABS 7.7  HGB 11.1*  HCT 33.8*  MCV 94.9  PLT 0000000   Basic Metabolic Panel: Recent Labs  Lab 12/22/21 1945  NA 135  K 4.0  CL 108  CO2 22  GLUCOSE 120*  BUN 30*  CREATININE 1.27*  CALCIUM 8.7*   GFR: Estimated Creatinine Clearance: 22.9 mL/min (A) (by C-G formula based on SCr of 1.27 mg/dL (H)).  Liver Function Tests: Recent Labs  Lab 12/22/21 1945  AST 22  ALT 16  ALKPHOS 57  BILITOT  0.8  PROT 6.6  ALBUMIN 3.7   Urine analysis:    Component Value Date/Time   COLORURINE YELLOW 07/08/2013 1708   APPEARANCEUR CLOUDY 07/08/2013 1708   LABSPEC 1.015 07/08/2013 1708   PHURINE 6.0 07/08/2013 1708   GLUCOSEU NEGATIVE 07/08/2013 1708   HGBUR 2+ 07/08/2013 1708   BILIRUBINUR NEGATIVE 07/08/2013 1708   KETONESUR NEGATIVE 07/08/2013 1708   PROTEINUR TRACE 07/08/2013 1708   NITRITE POSITIVE 07/08/2013 1708   LEUKOCYTESUR 3+ 07/08/2013 1708   Dr. Tobie Poet [facility]  If 7PM-7AM, please contact overnight-coverage provider If 7AM-7PM, please contact day coverage provider [email or web address]  12/23/2021, 12:29 AM

## 2021-12-23 ENCOUNTER — Inpatient Hospital Stay: Payer: Medicare PPO

## 2021-12-23 ENCOUNTER — Inpatient Hospital Stay: Payer: Medicare PPO | Admitting: Anesthesiology

## 2021-12-23 ENCOUNTER — Encounter: Payer: Self-pay | Admitting: Internal Medicine

## 2021-12-23 ENCOUNTER — Encounter
Admission: EM | Disposition: A | Discharge status: Skilled Nursing Facility | Payer: Self-pay | Source: Home / Self Care | Attending: Internal Medicine

## 2021-12-23 DIAGNOSIS — E039 Hypothyroidism, unspecified: Secondary | ICD-10-CM

## 2021-12-23 HISTORY — PX: INTRAMEDULLARY (IM) NAIL INTERTROCHANTERIC: SHX5875

## 2021-12-23 LAB — CBC
HCT: 29.9 % — ABNORMAL LOW (ref 36.0–46.0)
Hemoglobin: 10 g/dL — ABNORMAL LOW (ref 12.0–15.0)
MCH: 31.4 pg (ref 26.0–34.0)
MCHC: 33.4 g/dL (ref 30.0–36.0)
MCV: 94 fL (ref 80.0–100.0)
Platelets: 177 10*3/uL (ref 150–400)
RBC: 3.18 MIL/uL — ABNORMAL LOW (ref 3.87–5.11)
RDW: 12.7 % (ref 11.5–15.5)
WBC: 6.5 10*3/uL (ref 4.0–10.5)
nRBC: 0 % (ref 0.0–0.2)

## 2021-12-23 LAB — BASIC METABOLIC PANEL
Anion gap: 3 — ABNORMAL LOW (ref 5–15)
BUN: 27 mg/dL — ABNORMAL HIGH (ref 8–23)
CO2: 23 mmol/L (ref 22–32)
Calcium: 8.3 mg/dL — ABNORMAL LOW (ref 8.9–10.3)
Chloride: 109 mmol/L (ref 98–111)
Creatinine, Ser: 1.15 mg/dL — ABNORMAL HIGH (ref 0.44–1.00)
GFR, Estimated: 44 mL/min — ABNORMAL LOW (ref >60)
Glucose, Bld: 101 mg/dL — ABNORMAL HIGH (ref 70–99)
Potassium: 3.8 mmol/L (ref 3.5–5.1)
Sodium: 135 mmol/L (ref 135–145)

## 2021-12-23 SURGERY — FIXATION, FRACTURE, INTERTROCHANTERIC, WITH INTRAMEDULLARY ROD
Anesthesia: Spinal | Site: Hip | Laterality: Left

## 2021-12-23 MED ORDER — PHENYLEPHRINE HCL (PRESSORS) 10 MG/ML IV SOLN
INTRAVENOUS | Status: DC | PRN
  Administered 2021-12-23: 80 ug via INTRAVENOUS
  Administered 2021-12-23: 40 ug via INTRAVENOUS

## 2021-12-23 MED ORDER — HYDROCODONE-ACETAMINOPHEN 5-325 MG PO TABS
1.0000 | ORAL_TABLET | ORAL | Status: DC | PRN
  Administered 2021-12-23: 22:00:00 1 via ORAL
  Filled 2021-12-23: qty 1

## 2021-12-23 MED ORDER — CEFAZOLIN SODIUM-DEXTROSE 2-4 GM/100ML-% IV SOLN
INTRAVENOUS | Status: AC
  Filled 2021-12-23: qty 100

## 2021-12-23 MED ORDER — SENNA 8.6 MG PO TABS
1.0000 | ORAL_TABLET | Freq: Two times a day (BID) | ORAL | Status: DC
  Administered 2021-12-23 – 2021-12-28 (×9): 8.6 mg via ORAL
  Filled 2021-12-23 (×9): qty 1

## 2021-12-23 MED ORDER — ADULT MULTIVITAMIN W/MINERALS CH
1.0000 | ORAL_TABLET | Freq: Every day | ORAL | Status: DC
  Administered 2021-12-24 – 2021-12-28 (×5): 1 via ORAL
  Filled 2021-12-23 (×5): qty 1

## 2021-12-23 MED ORDER — EPHEDRINE SULFATE 50 MG/ML IJ SOLN
INTRAMUSCULAR | Status: DC | PRN
Start: 2021-12-23 — End: 2021-12-23
  Administered 2021-12-23 (×2): 5 mg via INTRAVENOUS
  Administered 2021-12-23: 10 mg via INTRAVENOUS
  Administered 2021-12-23: 5 mg via INTRAVENOUS

## 2021-12-23 MED ORDER — ENOXAPARIN SODIUM 30 MG/0.3ML IJ SOSY
30.0000 mg | PREFILLED_SYRINGE | INTRAMUSCULAR | Status: DC
  Administered 2021-12-24 – 2021-12-28 (×5): 30 mg via SUBCUTANEOUS
  Filled 2021-12-23 (×6): qty 0.3

## 2021-12-23 MED ORDER — BUPIVACAINE HCL (PF) 0.5 % IJ SOLN
INTRAMUSCULAR | Status: DC | PRN
  Administered 2021-12-23: 2.5 mL via INTRATHECAL

## 2021-12-23 MED ORDER — LEVOTHYROXINE SODIUM 25 MCG PO TABS
25.0000 ug | ORAL_TABLET | Freq: Every day | ORAL | Status: DC
  Administered 2021-12-24 – 2021-12-28 (×5): 25 ug via ORAL
  Filled 2021-12-23 (×5): qty 1

## 2021-12-23 MED ORDER — ONDANSETRON HCL 4 MG/2ML IJ SOLN
INTRAMUSCULAR | Status: DC | PRN
  Administered 2021-12-23: 4 mg via INTRAVENOUS

## 2021-12-23 MED ORDER — LIDOCAINE HCL (PF) 2 % IJ SOLN
INTRAMUSCULAR | Status: DC | PRN
  Administered 2021-12-23: 20 mg

## 2021-12-23 MED ORDER — BISACODYL 10 MG RE SUPP
10.0000 mg | Freq: Every day | RECTAL | Status: DC | PRN
  Administered 2021-12-28: 10 mg via RECTAL
  Filled 2021-12-23 (×2): qty 1

## 2021-12-23 MED ORDER — GLYCOPYRROLATE 0.2 MG/ML IJ SOLN
INTRAMUSCULAR | Status: AC
  Filled 2021-12-23: qty 1

## 2021-12-23 MED ORDER — DOCUSATE SODIUM 100 MG PO CAPS
100.0000 mg | ORAL_CAPSULE | Freq: Two times a day (BID) | ORAL | Status: DC
  Administered 2021-12-23 – 2021-12-28 (×11): 100 mg via ORAL
  Filled 2021-12-23 (×10): qty 1

## 2021-12-23 MED ORDER — FENTANYL CITRATE (PF) 100 MCG/2ML IJ SOLN
INTRAMUSCULAR | Status: AC
  Administered 2021-12-23: 14:00:00 25 ug via INTRAVENOUS
  Filled 2021-12-23: qty 2

## 2021-12-23 MED ORDER — GLYCOPYRROLATE 0.2 MG/ML IJ SOLN
INTRAMUSCULAR | Status: DC | PRN
  Administered 2021-12-23: .1 mg via INTRAVENOUS

## 2021-12-23 MED ORDER — ACETAMINOPHEN 10 MG/ML IV SOLN
INTRAVENOUS | Status: DC | PRN
  Administered 2021-12-23: 1000 mg via INTRAVENOUS

## 2021-12-23 MED ORDER — CEFAZOLIN SODIUM-DEXTROSE 2-4 GM/100ML-% IV SOLN
2.0000 g | INTRAVENOUS | Status: AC
  Administered 2021-12-23: 11:00:00 2 g via INTRAVENOUS

## 2021-12-23 MED ORDER — KETAMINE HCL 50 MG/5ML IJ SOSY
PREFILLED_SYRINGE | INTRAMUSCULAR | Status: AC
  Filled 2021-12-23: qty 5

## 2021-12-23 MED ORDER — FAMOTIDINE 20 MG PO TABS
20.0000 mg | ORAL_TABLET | Freq: Every day | ORAL | Status: DC
  Administered 2021-12-24 – 2021-12-28 (×5): 20 mg via ORAL
  Filled 2021-12-23 (×5): qty 1

## 2021-12-23 MED ORDER — ENSURE ENLIVE PO LIQD
237.0000 mL | Freq: Two times a day (BID) | ORAL | Status: DC
  Administered 2021-12-24 – 2021-12-26 (×5): 237 mL via ORAL

## 2021-12-23 MED ORDER — ONDANSETRON HCL 4 MG/2ML IJ SOLN
INTRAMUSCULAR | Status: AC
  Filled 2021-12-23: qty 2

## 2021-12-23 MED ORDER — ACETAMINOPHEN 10 MG/ML IV SOLN
INTRAVENOUS | Status: AC
  Filled 2021-12-23: qty 100

## 2021-12-23 MED ORDER — METHOCARBAMOL 500 MG PO TABS
500.0000 mg | ORAL_TABLET | Freq: Four times a day (QID) | ORAL | Status: DC | PRN
  Administered 2021-12-23 – 2021-12-28 (×7): 500 mg via ORAL
  Filled 2021-12-23 (×7): qty 1

## 2021-12-23 MED ORDER — POLYETHYLENE GLYCOL 3350 17 G PO PACK
17.0000 g | PACK | Freq: Every day | ORAL | Status: DC | PRN
  Administered 2021-12-25 – 2021-12-28 (×3): 17 g via ORAL
  Filled 2021-12-23 (×4): qty 1

## 2021-12-23 MED ORDER — TRAMADOL HCL 50 MG PO TABS
50.0000 mg | ORAL_TABLET | Freq: Four times a day (QID) | ORAL | Status: DC
  Administered 2021-12-23 – 2021-12-24 (×2): 50 mg via ORAL
  Filled 2021-12-23 (×2): qty 1

## 2021-12-23 MED ORDER — PROPOFOL 10 MG/ML IV BOLUS
INTRAVENOUS | Status: DC | PRN
  Administered 2021-12-23 (×3): 20 mg via INTRAVENOUS

## 2021-12-23 MED ORDER — LACTATED RINGERS IV SOLN: INTRAVENOUS | Status: DC | PRN

## 2021-12-23 MED ORDER — CHLORHEXIDINE GLUCONATE CLOTH 2 % EX PADS
6.0000 | MEDICATED_PAD | Freq: Every day | CUTANEOUS | Status: DC
  Administered 2021-12-23 – 2021-12-28 (×4): 6 via TOPICAL

## 2021-12-23 MED ORDER — ACETAMINOPHEN 10 MG/ML IV SOLN: 1000.0000 mg | Freq: Once | INTRAVENOUS | Status: DC | PRN

## 2021-12-23 MED ORDER — OXYCODONE HCL 5 MG PO TABS: 5.0000 mg | ORAL_TABLET | Freq: Once | ORAL | Status: DC | PRN

## 2021-12-23 MED ORDER — CEFAZOLIN SODIUM-DEXTROSE 2-4 GM/100ML-% IV SOLN
2.0000 g | Freq: Four times a day (QID) | INTRAVENOUS | Status: AC
  Administered 2021-12-23 (×2): 2 g via INTRAVENOUS
  Filled 2021-12-23 (×2): qty 100

## 2021-12-23 MED ORDER — ONDANSETRON HCL 4 MG PO TABS: 4.0000 mg | ORAL_TABLET | Freq: Four times a day (QID) | ORAL | Status: DC | PRN

## 2021-12-23 MED ORDER — KETAMINE HCL 50 MG/ML IJ SOLN
INTRAMUSCULAR | Status: DC | PRN
  Administered 2021-12-23 (×2): 25 mg via INTRAVENOUS

## 2021-12-23 MED ORDER — METHOCARBAMOL 1000 MG/10ML IJ SOLN
500.0000 mg | Freq: Four times a day (QID) | INTRAVENOUS | Status: DC | PRN
  Filled 2021-12-23: qty 5

## 2021-12-23 MED ORDER — PROPOFOL 500 MG/50ML IV EMUL
INTRAVENOUS | Status: DC | PRN
  Administered 2021-12-23: 25 ug/kg/min via INTRAVENOUS

## 2021-12-23 MED ORDER — LORAZEPAM 2 MG/ML IJ SOLN
0.5000 mg | Freq: Once | INTRAMUSCULAR | Status: AC
  Administered 2021-12-24: 0.5 mg via INTRAVENOUS
  Filled 2021-12-23: qty 1

## 2021-12-23 MED ORDER — FENTANYL CITRATE (PF) 100 MCG/2ML IJ SOLN
25.0000 ug | INTRAMUSCULAR | Status: DC | PRN
  Administered 2021-12-23: 14:00:00 25 ug via INTRAVENOUS

## 2021-12-23 MED ORDER — ACETAMINOPHEN 500 MG PO TABS
500.0000 mg | ORAL_TABLET | Freq: Four times a day (QID) | ORAL | Status: AC
  Administered 2021-12-23 – 2021-12-24 (×3): 500 mg via ORAL
  Filled 2021-12-23 (×3): qty 1

## 2021-12-23 MED ORDER — ALUM & MAG HYDROXIDE-SIMETH 200-200-20 MG/5ML PO SUSP
30.0000 mL | ORAL | Status: DC | PRN
  Administered 2021-12-23: 30 mL via ORAL
  Filled 2021-12-23: qty 30

## 2021-12-23 MED ORDER — ACETAMINOPHEN 325 MG PO TABS
325.0000 mg | ORAL_TABLET | Freq: Four times a day (QID) | ORAL | Status: DC | PRN
  Administered 2021-12-25: 650 mg via ORAL
  Filled 2021-12-23: qty 2

## 2021-12-23 MED ORDER — OXYCODONE HCL 5 MG/5ML PO SOLN: 5.0000 mg | Freq: Once | ORAL | Status: DC | PRN

## 2021-12-23 MED ORDER — 0.9 % SODIUM CHLORIDE (POUR BTL) OPTIME
TOPICAL | Status: DC | PRN
  Administered 2021-12-23: 12:00:00 500 mL

## 2021-12-23 MED ORDER — MORPHINE SULFATE (PF) 2 MG/ML IV SOLN
0.5000 mg | INTRAVENOUS | Status: DC | PRN
  Administered 2021-12-23: 16:00:00 0.5 mg via INTRAVENOUS
  Filled 2021-12-23: qty 1

## 2021-12-23 MED ORDER — ONDANSETRON HCL 4 MG/2ML IJ SOLN
4.0000 mg | Freq: Four times a day (QID) | INTRAMUSCULAR | Status: DC | PRN
  Administered 2021-12-23 – 2021-12-24 (×2): 4 mg via INTRAVENOUS
  Filled 2021-12-23 (×2): qty 2

## 2021-12-23 MED ORDER — ONDANSETRON HCL 4 MG/2ML IJ SOLN
4.0000 mg | Freq: Once | INTRAMUSCULAR | Status: AC | PRN
  Administered 2021-12-23: 15:00:00 4 mg via INTRAVENOUS

## 2021-12-23 MED ORDER — FAMOTIDINE 20 MG PO TABS
ORAL_TABLET | ORAL | Status: AC
  Administered 2021-12-23: 10:00:00 20 mg via ORAL
  Filled 2021-12-23: qty 1

## 2021-12-23 SURGICAL SUPPLY — 45 items
BIT DRILL CROWE POINT TWST 4.3 (DRILL) IMPLANT
BNDG COHESIVE 6X5 TAN ST LF (GAUZE/BANDAGES/DRESSINGS) ×4 IMPLANT
DRAPE 3/4 80X56 (DRAPES) ×4 IMPLANT
DRAPE SURG 17X11 SM STRL (DRAPES) ×4 IMPLANT
DRAPE U-SHAPE 47X51 STRL (DRAPES) ×2 IMPLANT
DRILL CROWE POINT TWIST 4.3 (DRILL) ×2
DRSG OPSITE POSTOP 3X4 (GAUZE/BANDAGES/DRESSINGS) ×4 IMPLANT
DRSG OPSITE POSTOP 4X14 (GAUZE/BANDAGES/DRESSINGS) IMPLANT
DRSG OPSITE POSTOP 4X6 (GAUZE/BANDAGES/DRESSINGS) ×2 IMPLANT
DURAPREP 26ML APPLICATOR (WOUND CARE) ×4 IMPLANT
ELECT REM PT RETURN 9FT ADLT (ELECTROSURGICAL) ×2
ELECTRODE REM PT RTRN 9FT ADLT (ELECTROSURGICAL) ×1 IMPLANT
GAUZE 4X4 16PLY ~~LOC~~+RFID DBL (SPONGE) ×2 IMPLANT
GLOVE SURG ORTHO LTX SZ9 (GLOVE) ×4 IMPLANT
GLOVE SURG UNDER POLY LF SZ9 (GLOVE) ×2 IMPLANT
GOWN STRL REUS TWL 2XL XL LVL4 (GOWN DISPOSABLE) ×2 IMPLANT
GOWN STRL REUS W/ TWL LRG LVL3 (GOWN DISPOSABLE) ×1 IMPLANT
GOWN STRL REUS W/TWL LRG LVL3 (GOWN DISPOSABLE) ×2
GUIDEPIN VERSANAIL DSP 3.2X444 (ORTHOPEDIC DISPOSABLE SUPPLIES) ×1 IMPLANT
GUIDEWIRE 3.0X100MM BALL TIP (WIRE) ×1 IMPLANT
HEMOVAC 400CC 10FR (MISCELLANEOUS) IMPLANT
HFN LH 130 DEG 11MM X 380MM (Orthopedic Implant) ×1 IMPLANT
HIP FRAC NAIL LAG SCR 10.5X100 (Orthopedic Implant) ×1 IMPLANT
KIT TURNOVER CYSTO (KITS) ×2 IMPLANT
MANIFOLD NEPTUNE II (INSTRUMENTS) ×2 IMPLANT
MAT ABSORB  FLUID 56X50 GRAY (MISCELLANEOUS) ×1
MAT ABSORB FLUID 56X50 GRAY (MISCELLANEOUS) ×1 IMPLANT
NS IRRIG 1000ML POUR BTL (IV SOLUTION) ×2 IMPLANT
NS IRRIG 500ML POUR BTL (IV SOLUTION) ×1 IMPLANT
PACK HIP COMPR (MISCELLANEOUS) ×2 IMPLANT
PAD ARMBOARD 7.5X6 YLW CONV (MISCELLANEOUS) ×2 IMPLANT
SCREW BONE CORTICAL 5.0X40 (Screw) ×1 IMPLANT
SCREW BONE CORTICAL 5.0X44 (Screw) ×1 IMPLANT
SCREW CANN THRD AFF 10.5X100 (Orthopedic Implant) IMPLANT
SPONGE T-LAP 18X18 ~~LOC~~+RFID (SPONGE) ×4 IMPLANT
STAPLER SKIN PROX 35W (STAPLE) ×2 IMPLANT
SUCTION FRAZIER HANDLE 10FR (MISCELLANEOUS) ×1
SUCTION TUBE FRAZIER 10FR DISP (MISCELLANEOUS) ×1 IMPLANT
SUT VIC AB 0 CT1 36 (SUTURE) ×4 IMPLANT
SUT VIC AB 2-0 CT1 27 (SUTURE) ×2
SUT VIC AB 2-0 CT1 TAPERPNT 27 (SUTURE) ×1 IMPLANT
SUT VICRYL 0 AB UR-6 (SUTURE) ×2 IMPLANT
SYR 30ML LL (SYRINGE) ×2 IMPLANT
TRAY FOLEY MTR SLVR 16FR STAT (SET/KITS/TRAYS/PACK) ×1 IMPLANT
WATER STERILE IRR 500ML POUR (IV SOLUTION) ×2 IMPLANT

## 2021-12-23 NOTE — Progress Notes (Signed)
Subjective:  POST OP CHECK s/p intramedullary fixation for left intertrochanteric hip fracture.   Patient reports left hip pain as moderate.  Patient is experiencing nausea.  She denies shortness of breath, chest pain or abdominal pain.  Her daughter is at the bedside.  Objective:   VITALS:   Vitals:   12/23/21 1430 12/23/21 1445 12/23/21 1500 12/23/21 1536  BP: (!) 157/61 (!) 155/65 (!) 155/61 (!) 167/82  Pulse: 69 (!) 58 62 97  Resp: 19 14 14 16   Temp:   (!) 97.1 F (36.2 C) 97.7 F (36.5 C)  TempSrc:      SpO2: 98% 95% 97% 96%  Weight:      Height:        PHYSICAL EXAM: Left lower extremity Neurovascular intact Sensation intact distally Intact pulses distally Dorsiflexion/Plantar flexion intact Incision: dressing C/D/I No cellulitis present Compartment soft  LABS  Results for orders placed or performed during the hospital encounter of 12/22/21 (from the past 24 hour(s))  CBC with Differential     Status: Abnormal   Collection Time: 12/22/21  7:45 PM  Result Value Ref Range   WBC 9.7 4.0 - 10.5 K/uL   RBC 3.56 (L) 3.87 - 5.11 MIL/uL   Hemoglobin 11.1 (L) 12.0 - 15.0 g/dL   HCT 12/24/21 (L) 08.6 - 57.8 %   MCV 94.9 80.0 - 100.0 fL   MCH 31.2 26.0 - 34.0 pg   MCHC 32.8 30.0 - 36.0 g/dL   RDW 46.9 62.9 - 52.8 %   Platelets 204 150 - 400 K/uL   nRBC 0.0 0.0 - 0.2 %   Neutrophils Relative % 80 %   Neutro Abs 7.7 1.7 - 7.7 K/uL   Lymphocytes Relative 14 %   Lymphs Abs 1.3 0.7 - 4.0 K/uL   Monocytes Relative 4 %   Monocytes Absolute 0.4 0.1 - 1.0 K/uL   Eosinophils Relative 1 %   Eosinophils Absolute 0.1 0.0 - 0.5 K/uL   Basophils Relative 1 %   Basophils Absolute 0.1 0.0 - 0.1 K/uL   Immature Granulocytes 0 %   Abs Immature Granulocytes 0.03 0.00 - 0.07 K/uL  Comprehensive metabolic panel     Status: Abnormal   Collection Time: 12/22/21  7:45 PM  Result Value Ref Range   Sodium 135 135 - 145 mmol/L   Potassium 4.0 3.5 - 5.1 mmol/L   Chloride 108 98 - 111  mmol/L   CO2 22 22 - 32 mmol/L   Glucose, Bld 120 (H) 70 - 99 mg/dL   BUN 30 (H) 8 - 23 mg/dL   Creatinine, Ser 12/24/21 (H) 0.44 - 1.00 mg/dL   Calcium 8.7 (L) 8.9 - 10.3 mg/dL   Total Protein 6.6 6.5 - 8.1 g/dL   Albumin 3.7 3.5 - 5.0 g/dL   AST 22 15 - 41 U/L   ALT 16 0 - 44 U/L   Alkaline Phosphatase 57 38 - 126 U/L   Total Bilirubin 0.8 0.3 - 1.2 mg/dL   GFR, Estimated 39 (L) >60 mL/min   Anion gap 5 5 - 15  Type and screen [facility]     Status: None   Collection Time: 12/22/21  7:45 PM  Result Value Ref Range   ABO/RH(D) O NEG    Antibody Screen NEG    Sample Expiration      12/25/2021,2359 Performed at [facility], [address] [place], [place]   Resp Panel by RT-PCR (Flu A&B, Covid) Nasopharyngeal  Swab     Status: None   Collection Time: 12/22/21  7:45 PM   Specimen: Nasopharyngeal Swab; Nasopharyngeal(NP) swabs in vial transport medium  Result Value Ref Range   SARS Coronavirus 2 by RT PCR NEGATIVE NEGATIVE   Influenza A by PCR NEGATIVE NEGATIVE   Influenza B by PCR NEGATIVE NEGATIVE  Basic metabolic panel     Status: Abnormal   Collection Time: 12/23/21  6:15 AM  Result Value Ref Range   Sodium 135 135 - 145 mmol/L   Potassium 3.8 3.5 - 5.1 mmol/L   Chloride 109 98 - 111 mmol/L   CO2 23 22 - 32 mmol/L   Glucose, Bld 101 (H) 70 - 99 mg/dL   BUN 27 (H) 8 - 23 mg/dL   Creatinine, Ser 2.72 (H) 0.44 - 1.00 mg/dL   Calcium 8.3 (L) 8.9 - 10.3 mg/dL   GFR, Estimated 44 (L) >60 mL/min   Anion gap 3 (L) 5 - 15  CBC     Status: Abnormal   Collection Time: 12/23/21  6:15 AM  Result Value Ref Range   WBC 6.5 4.0 - 10.5 K/uL   RBC 3.18 (L) 3.87 - 5.11 MIL/uL   Hemoglobin 10.0 (L) 12.0 - 15.0 g/dL   HCT 53.6 (L) 64.4 - 03.4 %   MCV 94.0 80.0 - 100.0 fL   MCH 31.4 26.0 - 34.0 pg   MCHC 33.4 30.0 - 36.0 g/dL   RDW 74.2 59.5 - 63.8 %   Platelets 177 150 - 400 K/uL   nRBC 0.0 0.0 - 0.2 %    DG Chest 1 View  Result Date:  12/22/2021 CLINICAL DATA:  Left hip pain.  Status post fall. EXAM: CHEST  1 VIEW COMPARISON:  01/05/2015 FINDINGS: The heart size and mediastinal contours are within normal limits. Aortic atherosclerotic calcifications. Both lungs are clear. The visualized skeletal structures are unremarkable. IMPRESSION: No active disease. Electronically Signed   By: Signa Kell M.D.   On: 12/22/2021 19:09   CT HEAD WO CONTRAST ( )  Result Date: 12/22/2021 CLINICAL DATA:  [place] backwards, head trauma EXAM: CT HEAD WITHOUT CONTRAST TECHNIQUE: Contiguous axial images were obtained from the base of the skull through the vertex without intravenous contrast. COMPARISON:  None. FINDINGS: Brain: No acute infarct or hemorrhage. Lateral ventricles and midline structures are unremarkable. No acute extra-axial fluid collections. No mass effect. Vascular: No hyperdense vessel or unexpected calcification. Skull: Normal. Negative for fracture or focal lesion. Sinuses/Orbits: No acute finding. Other: None. IMPRESSION: 1. No acute intracranial process. Electronically Signed   By: Sharlet Salina M.D.   On: 12/22/2021 20:13   CT Cervical Spine Wo Contrast  Result Date: 12/22/2021 CLINICAL DATA:  [place] backwards, neck trauma EXAM: CT CERVICAL SPINE WITHOUT CONTRAST TECHNIQUE: Multidetector CT imaging of the cervical spine was performed without intravenous contrast. Multiplanar CT image reconstructions were also generated. COMPARISON:  None. FINDINGS: Alignment: Minimal anterolisthesis of C3 relative to C4 likely due to degenerative change. Otherwise alignment is anatomic. Skull base and vertebrae: No acute fracture. No primary bone lesion or focal pathologic process. Soft tissues and spinal canal: No prevertebral fluid or swelling. No visible canal hematoma. Disc levels: Multilevel cervical spondylosis greatest at C4-5, C5-6, and C6-7. Multilevel facet hypertrophy greatest at C3-4. Bony fusion across the C4-5 facet joints on the left.  Upper chest: Central airway is patent.  Lung apices are clear. Other: Reconstructed images demonstrate no additional findings. IMPRESSION: 1. No acute cervical spine fracture. 2. Multilevel degenerative  changes as above. Electronically Signed   By: Sharlet Salina M.D.   On: 12/22/2021 20:11   DG C-Arm 1-60 Min-No Report  Result Date: 12/23/2021 CLINICAL DATA:  Surgical internal fixation of left hip fracture. EXAM: DG HIP (WITH OR WITHOUT PELVIS) 2-3V LEFT; DG C-ARM 1-60 MIN-NO REPORT Radiation exposure index: 14.815 mGy. COMPARISON:  December 22, 2021. FINDINGS: Four intraoperative fluoroscopic images were obtained of the left femur. These images demonstrate surgical internal fixation of proximal left femoral intertrochanteric fracture with intramedullary rod fixation of the left femoral shaft. IMPRESSION: Fluoroscopic guidance provided during surgical internal fixation of proximal left femoral fracture Electronically Signed   By: Lupita Raider M.D.   On: 12/23/2021 12:59   DG HIP UNILAT WITH PELVIS 2-3 VIEWS LEFT  Result Date: 12/23/2021 CLINICAL DATA:  Surgical internal fixation of left hip fracture. EXAM: DG HIP (WITH OR WITHOUT PELVIS) 2-3V LEFT; DG C-ARM 1-60 MIN-NO REPORT Radiation exposure index: 14.815 mGy. COMPARISON:  December 22, 2021. FINDINGS: Four intraoperative fluoroscopic images were obtained of the left femur. These images demonstrate surgical internal fixation of proximal left femoral intertrochanteric fracture with intramedullary rod fixation of the left femoral shaft. IMPRESSION: Fluoroscopic guidance provided during surgical internal fixation of proximal left femoral fracture Electronically Signed   By: Lupita Raider M.D.   On: 12/23/2021 12:59   DG Hip Unilat W or Wo Pelvis 2-3 Views Left  Result Date: 12/22/2021 CLINICAL DATA:  Status post fall EXAM: DG HIP (WITH OR WITHOUT PELVIS) 2-3V LEFT COMPARISON:  None. FINDINGS: Bones are diffusely osteopenic. There is an acute,  comminuted intertrochanteric fracture involving the proximal left femur. Mild impaction of the fracture fragments identified. Large stool burden is incidentally noted within the colon and rectum. IMPRESSION: Acute, comminuted intertrochanteric fracture of the proximal left femur. Electronically Signed   By: Signa Kell M.D.   On: 12/22/2021 19:08   DG FEMUR PORT MIN 2 VIEWS LEFT  Result Date: 12/23/2021 CLINICAL DATA:  Status post IM nail placement EXAM: LEFT FEMUR PORTABLE 2 VIEWS COMPARISON:  None. FINDINGS: Left intertrochanteric fracture transfixed with a intramedullary nail and interlocking femoral neck screw. No hardware failure or complication. Near anatomic alignment. Postsurgical changes in the soft tissues overlying the left hip. 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 IMPRESSION: Interval ORIF of a left intertrochanteric fracture. Electronically Signed   By: Elige Ko M.D.   On: 12/23/2021 14:44    Assessment/Plan: Day of Surgery   Principal Problem:   Femur fracture, left (HCC) Active Problems:   Essential hypertension   GERD (gastroesophageal reflux disease)   Acquired hypothyroidism  Patient is stable postop.  Postoperative x-rays demonstrate the fracture is in acceptable alignment as is the intramedullary hardware.  Patient will complete 24 hours of postop antibiotics.  Patient will have labs drawn in the morning.  Her Foley catheter will be removed in the morning.  Patient will begin Lovenox tomorrow for DVT prophylaxis.  Patient will begin physical therapy tomorrow.  She will likely need a skilled nursing facility upon discharge.    Juanell Fairly , MD 12/23/2021, 5:01 PM

## 2021-12-23 NOTE — Progress Notes (Signed)
Initial Nutrition Assessment  DOCUMENTATION CODES:   Not applicable  INTERVENTION:   -Once diet is advanced, add:   -Ensure Enlive po BID, each supplement provides 350 kcal and 20 grams of protein  -MVI with minerals daily  NUTRITION DIAGNOSIS:   Increased nutrient needs related to post-op healing as evidenced by estimated needs.  GOAL:   Patient will meet greater than or equal to 90% of their needs  MONITOR:   PO intake, Supplement acceptance, Diet advancement, Labs, Weight trends, Skin, I & O's  REASON FOR ASSESSMENT:   Consult Assessment of nutrition requirement/status, Hip fracture protocol  ASSESSMENT:   Daisy Mora is a 87 y.o. female with medical history significant for hypertension, GERD, who presents emergency department for chief concerns of a fall.  Pt admitted with lt femur fracture s/p fall.   Pt awaiting orthopedics consult. Pt is NPO for pending procedure.   Pt unavailable at time of visit. RD unable to obtain further nutrition-related history or complete nutrition-focused physical exam at this time.    Reviewed wt hx; wt has been stable over the past 4 years.   Pt with increased nutritional needs for post-operative healing and would benefit from addition of oral nutrition supplements.    Medications reviewed.   Labs reviewed.   Diet Order:   Diet Order             Diet NPO time specified Except for: Ice Chips, Sips with Meds  Diet effective midnight                   EDUCATION NEEDS:   No education needs have been identified at this time  Skin:  Skin Assessment: Reviewed RN Assessment  Last BM:  Unknown  Height:   Ht Readings from Last 1 Encounters:  12/22/21 5\' 3"  (1.6 m)    Weight:   Wt Readings from Last 1 Encounters:  12/22/21 59 kg    Ideal Body Weight:  52.3 kg  BMI:  Body mass index is 23.04 kg/m.  Estimated Nutritional Needs:   Kcal:  1550-1750  Protein:  75-90 grams  Fluid:  > 1.5  L    12/24/21, RD, LDN, CDCES Registered Dietitian II Certified Diabetes Care and Education Specialist Please refer to [facility] for RD and/or RD on-call/weekend/after hours pager

## 2021-12-23 NOTE — Anesthesia Preprocedure Evaluation (Signed)
Anesthesia Evaluation  Patient identified by MRN, date of birth, ID band Patient awake    Reviewed: Allergy & Precautions, NPO status , Patient's Chart, lab work & pertinent test results  History of Anesthesia Complications Negative for: history of anesthetic complications  Airway Mallampati: II  TM Distance: >3 FB Neck ROM: Full    Dental  (+) Teeth Intact, Implants, Poor Dentition   Pulmonary neg pulmonary ROS, neg sleep apnea, neg COPD, Patient abstained from smoking.Not current smoker,    Pulmonary exam normal breath sounds clear to auscultation       Cardiovascular Exercise Tolerance: Good METShypertension, (-) CAD and (-) Past MI (-) dysrhythmias  Rhythm:Regular Rate:Normal - Systolic murmurs Lives alone, performs ADLs   Neuro/Psych negative neurological ROS  negative psych ROS   GI/Hepatic GERD  Medicated,(+)     (-) substance abuse  ,   Endo/Other  neg diabetesHypothyroidism   Renal/GU negative Renal ROS     Musculoskeletal   Abdominal   Peds  Hematology   Anesthesia Other Findings Past Medical History: No date: Hypertension  Reproductive/Obstetrics                             Anesthesia Physical Anesthesia Plan  ASA: 2  Anesthesia Plan: Spinal   Post-op Pain Management: Ofirmev IV (intra-op)   Induction: Intravenous  PONV Risk Score and Plan: 2 and Ondansetron, Dexamethasone, Propofol infusion, TIVA, Treatment may vary due to age or medical condition and Midazolam  Airway Management Planned: Natural Airway  Additional Equipment: None  Intra-op Plan:   Post-operative Plan:   Informed Consent: I have reviewed the patients History and Physical, chart, labs and discussed the procedure including the risks, benefits and alternatives for the proposed anesthesia with the patient or authorized representative who has indicated his/her understanding and acceptance.    Patient has DNR.  Discussed DNR with patient and Suspend DNR.     Plan Discussed with: CRNA and Surgeon  Anesthesia Plan Comments: (Discussed R/B/A of neuraxial anesthesia technique with patient and daughter at bedside: - rare risks of spinal/epidural hematoma, nerve damage, infection - Risk of PDPH - Risk of nausea and vomiting - Risk of conversion to general anesthesia and its associated risks, including sore throat, damage to lips/eyes/teeth/oropharynx, and rare risks such as cardiac and respiratory events. - Risk of allergic reactions  Discussed the role of CRNA in patient's perioperative care. Discussed risk of post operative cognitive dysfunction.  Patient voiced understanding.)        Anesthesia Quick Evaluation

## 2021-12-23 NOTE — Transfer of Care (Signed)
Immediate Anesthesia Transfer of Care Note  Patient: Daisy Mora  Procedure(s) Performed: INTRAMEDULLARY (IM) NAIL INTERTROCHANTRIC (Left: Hip)  Patient Location: PACU  Anesthesia Type:Spinal  Level of Consciousness: awake and alert   Airway & Oxygen Therapy: Patient Spontanous Breathing  Post-op Assessment: Report given to RN and Post -op Vital signs reviewed and stable  Post vital signs: Reviewed  Last Vitals:  Vitals Value Taken Time  BP    Temp    Pulse    Resp    SpO2      Last Pain:         Complications: No notable events documented.

## 2021-12-23 NOTE — Op Note (Signed)
12/23/2021  1:46 PM  PATIENT:  Daisy Mora    PRE-OPERATIVE DIAGNOSIS:  Left displaced intertrochanteric hip fracture  POST-OPERATIVE DIAGNOSIS:  Same  PROCEDURE: Intramedullary fixation for left displaced intertrochanteric hip fracture  SURGEON:  Juanell Fairly, MD  ANESTHESIA:   Spinal  EBL: 200 cc  IMPLANT:  ZIMMER BIOMET AFFIXUS NAIL 11 mm x 380 mm with a 100 mm lag screw and distal interlocking screws 40 mm  and 44 mm in length.  PREOPERATIVE INDICATIONS:  Daisy Mora is a  87 y.o. female with a diagnosis of Left hip fracture who failed conservative measures and elected for surgical management.    The risks, benefits and alternatives were discussed with the patient and their family.  The risks include but are not limited to infection, bleeding requiring blood transfusion, nerve or blood vessel injury, malunion, nonunion, hardware prominence, hardware failure, leg length discrepancy or change in lower extremity rotation and need for further surgery including hardware removal with conversion to a total hip arthroplasty. Medical risks include but are not limited to DVT and pulmonary embolism, myocardial infarction, stroke, pneumonia, respiratory failure and death. The patient and their daughter understood these risks and wished to proceed with surgery.  OPERATIVE PROCEDURE:  The patient was brought to the operating room and placed in the supine position on the fracture table. The patient received spinal anesthesia.  A closed reduction was performed under C-arm guidance.  The fracture reduction was confirmed on both AP and lateral views. After adequate reduction was achieved, a time out was performed to verify the patient's name, date of birth, medical record number, correct site of surgery correct procedure to be performed. The timeout was also used to verify the patient received antibiotics and all appropriate instruments, implants and radiographic studies were available in  the room. Once all in attendance were in agreement, the case began. The patient was prepped and draped in a sterile fashion.  The patient received preoperative antibiotics with 2 g of Ancef IV.  An incision was made proximal to the greater trochanter in line with the femur. A guidewire was placed over the tip of the greater trochanter and advanced by drill into the proximal femur to the level of the lesser trochanter.  Confirmation of the drill pin position was made on AP and lateral C-arm images.  The threaded guidepin was then overdrilled with the proximal femoral entry reamer.  A ball-tipped guidewire was then advanced down the intramedullary canal, across the fracture, and down the femoral shaft to the knee.  The ball tip guidewire's position was confirmed at both the knee and hip via C-arm imaging. A depth gauge was used to measure the length of the long nail to be used. It was measured to be 180 mm. The actual nail was then inserted into the proximal femur, across the fracture site and down the femoral shaft. Its position was confirmed on AP and lateral C-arm images.  The ball tip guidewire was removed.  Once the nail was completely seated, the drill guide for the lag screw was placed through the guide arm for the Affixus nail. A guidepin was then placed through this drill guide and advanced through the lateral cortex of the femur, across the fracture site and into the femoral head achieving a tip apex distance of less than 25 mm. The length of the drill pin was measured to 100 mm, and then the drill for the lag screw was advanced through the lateral cortex, across the fracture  site and up into the femoral head to the depth of the lag screw..  The lag screw was then advanced by hand into position across the fracture site into the femoral head. Its final position was confirmed on AP and lateral C-arm images. Compression was applied as traction was carefully released. The set screw in the top of the  intramedullary rod was tightened by hand using a screwdriver. It was backed off a quarter turn to allow for compression at the fracture site.  The attention was then turned to placement of the distal interlocking screws. A perfect circle technique was used. 2 small stab incisions were made over the distal interlocking screw holes.  A free hand technique was used to drill both distal interlocking screws. The depth of the screw holes was measured with a depth gauge. The 40 mm and 84 mm screws were then advanced into position and tightened by hand. Final C-arm images of the entire intramedullary construct were taken in both the AP and lateral planes.   The wounds were irrigated copiously and closed with 0 Vicryl for closure of the deep fascia and 2-0 Vicryl for subcutaneous closure. The skin was approximated with staples. A dry sterile dressing was applied. I was scrubbed and present the entire case and all sharp, sponge and instrument counts were correct at the conclusion of the case. Patient was transferred to a hospital bed and brought to PACU in stable condition.     Kathreen Devoid, MD

## 2021-12-23 NOTE — TOC Progression Note (Signed)
Transition of Care [facility]) - Progression Note    Patient Details  Name: Daisy Mora MRN: [id number] Date of Birth: 10-08-1926  [facility]) CM/SW Contact  Marlowe Sax, RN Phone Number: 12/23/2021, 3:40 PM  Clinical Narrative:    Patient comes from home where she lives alone, she was going in between the [place] and laundry room, where there is  a step up between the laundry room in the kitchen and she tripped, lost her balance, She had surgery, PT is to evaluate, TOC to assist with DC planning        Expected Discharge Plan and Services                                                 Social Determinants of Health (SDOH) Interventions    Readmission Risk Interventions No flowsheet data found.

## 2021-12-23 NOTE — Progress Notes (Signed)
PROGRESS NOTE    Daisy Mora  PTW:[id number] DOB: April 17, 1926 DOA: 12/22/2021 PCP: System, Provider Not In   Brief Narrative:  Daisy Mora is a 87 y.o. female with medical history significant for hypertension, GERD, who presents emergency department for chief concerns of a fall. Noted a mechanical fall where she tripped, lost her balance, with laundry in her arms and unable to catch herself. She reports that when she fell she did not hit her head, loss of consciousness, or pass out.  She states that the pain was not immediate.  She states that when she attempts to ambulate or get up and or bear weight, the pain was a 10 out of 10. Presented to the ED where imaging confirmed Left femur fracture - ortho called for consult   Assessment & Plan:  Acute comminuted intertrochanteric fracture of the left proximal femur -Secondary to mechanical fall  -Orthopedics following, plan for ORIF with Dr. Martha Clan later today -Pain currently well controlled on current regimen -N.p.o. this morning, transition back to regular diet postoperatively -PT OT to follow, likely disposition SNF given patient's age and fracture type   Essential hypertension, uncontrolled  -Likely uncontrolled in the setting of pain, continue Coreg hydralazine amlodipine Home meds  -Continue as needed labetalol    Hypothyroidism, stable -continue levothyroxine 25 mcg daily GERD-PPI resumed   DVT prophylaxis: Per orthopedics Code Status: DNR Family Communication: At bedside  Status is: Inpatient  Dispo: The patient is from: Home              Anticipated d/c is to: To be determined              Anticipated d/c date is: 48 to 72 hours              Patient currently not medically stable for discharge  Consultants:  Orthopedic surgery  Procedures:  ORIF left femur 12/23/2021  Antimicrobials:  None indicated  Subjective: No acute issues or events overnight, pain well controlled denies nausea vomiting diarrhea  constipation headache fevers chills or chest pain  Objective: Vitals:   12/23/21 0400 12/23/21 0500 12/23/21 0600 12/23/21 0700  BP: (!) 138/53 (!) 145/54 (!) 167/56 (!) 163/56  Pulse: (!) 53 (!) 55 61 (!) 56  Resp: 11 14 13 14   Temp:      TempSrc:      SpO2: 98% 97% 99% 99%  Weight:      Height:        Intake/Output Summary (Last 24 hours) at 12/23/2021 0816 Last data filed at 12/23/2021 0100 Gross per 24 hour  Intake 250 ml  Output --  Net 250 ml   Filed Weights   12/22/21 1745  Weight: 59 kg    Examination:  General:  Pleasantly resting in bed, No acute distress. HEENT:  Normocephalic atraumatic.  Sclerae nonicteric, noninjected.  Extraocular movements intact bilaterally. Neck:  Without mass or deformity.  Trachea is midline. Lungs:  Clear to auscultate bilaterally without rhonchi, wheeze, or rales. Heart:  Regular rate and rhythm.  Without murmurs, rubs, or gallops. Abdomen:  Soft, nontender, nondistended.  Without guarding or rebound. Extremities: Without cyanosis, clubbing, edema Vascular:  Dorsalis pedis and posterior tibial pulses palpable bilaterally. Skin:  Warm and dry, no erythema, no ulcerations.   Data Reviewed: I have personally reviewed following labs and imaging studies  CBC: Recent Labs  Lab 12/22/21 1945 12/23/21 0615  WBC 9.7 6.5  NEUTROABS 7.7  --   HGB 11.1* 10.0*  HCT  33.8* 29.9*  MCV 94.9 94.0  PLT 204 177   Basic Metabolic Panel: Recent Labs  Lab 12/22/21 1945 12/23/21 0615  NA 135 135  K 4.0 3.8  CL 108 109  CO2 22 23  GLUCOSE 120* 101*  BUN 30* 27*  CREATININE 1.27* 1.15*  CALCIUM 8.7* 8.3*   GFR: Estimated Creatinine Clearance: 25.3 mL/min (A) (by C-G formula based on SCr of 1.15 mg/dL (H)). Liver Function Tests: Recent Labs  Lab 12/22/21 1945  AST 22  ALT 16  ALKPHOS 57  BILITOT 0.8  PROT 6.6  ALBUMIN 3.7   No results for input(s): LIPASE, AMYLASE in the last 168 hours. No results for input(s): AMMONIA in  the last 168 hours. Coagulation Profile: No results for input(s): INR, PROTIME in the last 168 hours. Cardiac Enzymes: No results for input(s): CKTOTAL, CKMB, CKMBINDEX, TROPONINI in the last 168 hours. BNP (last 3 results) No results for input(s): PROBNP in the last 8760 hours. HbA1C: No results for input(s): HGBA1C in the last 72 hours. CBG: No results for input(s): GLUCAP in the last 168 hours. Lipid Profile: No results for input(s): CHOL, HDL, LDLCALC, TRIG, CHOLHDL, LDLDIRECT in the last 72 hours. Thyroid Function Tests: No results for input(s): TSH, T4TOTAL, FREET4, T3FREE, THYROIDAB in the last 72 hours. Anemia Panel: No results for input(s): VITAMINB12, FOLATE, FERRITIN, TIBC, IRON, RETICCTPCT in the last 72 hours. Sepsis Labs: No results for input(s): PROCALCITON, LATICACIDVEN in the last 168 hours.  Recent Results (from the past 240 hour(s))  Resp Panel by RT-PCR (Flu A&B, Covid) Nasopharyngeal Swab     Status: None   Collection Time: 12/22/21  7:45 PM   Specimen: Nasopharyngeal Swab; Nasopharyngeal(NP) swabs in vial transport medium  Result Value Ref Range Status   SARS Coronavirus 2 by RT PCR NEGATIVE NEGATIVE Final    Comment: (NOTE) SARS-CoV-2 target nucleic acids are NOT DETECTED.  The SARS-CoV-2 RNA is generally detectable in upper respiratory specimens during the acute phase of infection. The lowest concentration of SARS-CoV-2 viral copies this assay can detect is 138 copies/mL. A negative result does not preclude SARS-Cov-2 infection and should not be used as the sole basis for treatment or other patient management decisions. A negative result may occur with  improper specimen collection/handling, submission of specimen other than nasopharyngeal swab, presence of viral mutation(s) within the areas targeted by this assay, and inadequate number of viral copies(<138 copies/mL). A negative result must be combined with clinical observations, patient history, and  epidemiological information. The expected result is Negative.  Fact Sheet for Patients:  BloggerCourse.com  Fact Sheet for Healthcare Providers:  SeriousBroker.it  This test is no t yet approved or cleared by the [place] FDA and  has been authorized for detection and/or diagnosis of SARS-CoV-2 by FDA under an Emergency Use Authorization (EUA). This EUA will remain  in effect (meaning this test can be used) for the duration of the COVID-19 declaration under Section 564(b)(1) of the Act, 21 U.S.C.section 360bbb-3(b)(1), unless the authorization is terminated  or revoked sooner.       Influenza A by PCR NEGATIVE NEGATIVE Final   Influenza B by PCR NEGATIVE NEGATIVE Final    Comment: (NOTE) The Xpert Xpress SARS-CoV-2/FLU/RSV plus assay is intended as an aid in the diagnosis of influenza from Nasopharyngeal swab specimens and should not be used as a sole basis for treatment. Nasal washings and aspirates are unacceptable for Xpert Xpress SARS-CoV-2/FLU/RSV testing.  Fact Sheet for Patients: BloggerCourse.com  Fact Sheet  for Healthcare Providers: SeriousBroker.it  This test is not yet approved or cleared by the [place] and has been authorized for detection and/or diagnosis of SARS-CoV-2 by FDA under an Emergency Use Authorization (EUA). This EUA will remain in effect (meaning this test can be used) for the duration of the COVID-19 declaration under Section 564(b)(1) of the Act, 21 U.S.C. section 360bbb-3(b)(1), unless the authorization is terminated or revoked.  Performed at [facility], [address], [place], [place] [address]      Radiology Studies: DG Chest 1 View  Result Date: 12/22/2021 CLINICAL DATA:  Left hip pain.  Status post fall. EXAM: CHEST  1 VIEW COMPARISON:  01/05/2015 FINDINGS: The heart size and mediastinal contours are within  normal limits. Aortic atherosclerotic calcifications. Both lungs are clear. The visualized skeletal structures are unremarkable. IMPRESSION: No active disease. Electronically Signed   By: Signa Kell M.D.   On: 12/22/2021 19:09   CT HEAD WO CONTRAST ( )  Result Date: 12/22/2021 CLINICAL DATA:  Larey Seat backwards, head trauma EXAM: CT HEAD WITHOUT CONTRAST TECHNIQUE: Contiguous axial images were obtained from the base of the skull through the vertex without intravenous contrast. COMPARISON:  None. FINDINGS: Brain: No acute infarct or hemorrhage. Lateral ventricles and midline structures are unremarkable. No acute extra-axial fluid collections. No mass effect. Vascular: No hyperdense vessel or unexpected calcification. Skull: Normal. Negative for fracture or focal lesion. Sinuses/Orbits: No acute finding. Other: None. IMPRESSION: 1. No acute intracranial process. Electronically Signed   By: Sharlet Salina M.D.   On: 12/22/2021 20:13   CT Cervical Spine Wo Contrast  Result Date: 12/22/2021 CLINICAL DATA:  Larey Seat backwards, neck trauma EXAM: CT CERVICAL SPINE WITHOUT CONTRAST TECHNIQUE: Multidetector CT imaging of the cervical spine was performed without intravenous contrast. Multiplanar CT image reconstructions were also generated. COMPARISON:  None. FINDINGS: Alignment: Minimal anterolisthesis of C3 relative to C4 likely due to degenerative change. Otherwise alignment is anatomic. Skull base and vertebrae: No acute fracture. No primary bone lesion or focal pathologic process. Soft tissues and spinal canal: No prevertebral fluid or swelling. No visible canal hematoma. Disc levels: Multilevel cervical spondylosis greatest at C4-5, C5-6, and C6-7. Multilevel facet hypertrophy greatest at C3-4. Bony fusion across the C4-5 facet joints on the left. Upper chest: Central airway is patent.  Lung apices are clear. Other: Reconstructed images demonstrate no additional findings. IMPRESSION: 1. No acute cervical spine  fracture. 2. Multilevel degenerative changes as above. Electronically Signed   By: Sharlet Salina M.D.   On: 12/22/2021 20:11   DG Hip Unilat W or Wo Pelvis 2-3 Views Left  Result Date: 12/22/2021 CLINICAL DATA:  Status post fall EXAM: DG HIP (WITH OR WITHOUT PELVIS) 2-3V LEFT COMPARISON:  None. FINDINGS: Bones are diffusely osteopenic. There is an acute, comminuted intertrochanteric fracture involving the proximal left femur. Mild impaction of the fracture fragments identified. Large stool burden is incidentally noted within the colon and rectum. IMPRESSION: Acute, comminuted intertrochanteric fracture of the proximal left femur. Electronically Signed   By: Signa Kell M.D.   On: 12/22/2021 19:08        Scheduled Meds:  amLODipine  10 mg Oral Daily   carvedilol  25 mg Oral BID WC   hydrALAZINE  25 mg Oral TID   levothyroxine  25 mcg Oral Q0600   pantoprazole  20 mg Oral Daily   Continuous Infusions:   LOS: 1 day   Time spent:  Azucena Fallen, DO [facility]  If  7PM-7AM, please contact night-coverage [email or web address]  12/23/2021, 8:16 AM

## 2021-12-23 NOTE — Anesthesia Procedure Notes (Addendum)
Spinal  Patient location during procedure: OR Reason for block: surgical anesthesia Staffing Performed: anesthesiologist  Anesthesiologist: Arita Miss, MD Resident/CRNA: Rolla Plate, CRNA Preanesthetic Checklist Completed: patient identified, IV checked, site marked, risks and benefits discussed, surgical consent, monitors and equipment checked, pre-op evaluation and timeout performed Spinal Block Patient position: right lateral decubitus Prep: ChloraPrep and site prepped and draped Patient monitoring: heart rate, continuous pulse ox, blood pressure and cardiac monitor Approach: midline Location: L4-5 Injection technique: single-shot Needle Needle type: Quincke  Needle gauge: 22 G Needle length: 9 cm Assessment Events: CSF return Additional Notes Negative paresthesia. Negative blood return. Positive free-flowing CSF. Expiration date of kit checked and confirmed. Patient tolerated procedure well, without complications.

## 2021-12-23 NOTE — Consult Note (Signed)
ORTHOPAEDIC CONSULTATION  REQUESTING PHYSICIAN: Azucena Fallen, MD  Chief Complaint: Left intertrochanteric hip fracture  HPI: Daisy Mora is a 87 y.o. female was admitted overnight after a mechanical fall causing injury to her left hip.  Patient was seen in the emergency room this morning with her daughter at the bedside.  Patient was complaining of left hip pain.  She was unable to stand after a fall at home.  Patient states she has a step in her house.  When she went to step up her left leg gave out causing her to fall, injuring her left hip.  Patient states she has osteoarthritis in her left knee.  Past Medical History:  Diagnosis Date   Hypertension    Past Surgical History:  Procedure Laterality Date   ABDOMINAL HYSTERECTOMY     APPENDECTOMY     BREAST SURGERY     Social History   Socioeconomic History   Marital status: Widowed    Spouse name: Not on file   Number of children: Not on file   Years of education: Not on file   Highest education level: Not on file  Occupational History   Not on file  Tobacco Use   Smoking status: Never   Smokeless tobacco: Never  Vaping Use   Vaping Use: Never used  Substance and Sexual Activity   Alcohol use: No   Drug use: No   Sexual activity: Not Currently  Other Topics Concern   Not on file  Social History Narrative   Not on file   Social Determinants of Health   Financial Resource Strain: Not on file  Food Insecurity: Not on file  Transportation Needs: Not on file  Physical Activity: Not on file  Stress: Not on file  Social Connections: Not on file   History reviewed. No pertinent family history. Allergies  Allergen Reactions   Aspirin Tinitus   Prior to Admission medications   Medication Sig Start Date End Date Taking? Authorizing Provider  amLODipine (NORVASC) 10 MG tablet Take 10 mg by mouth daily.   Yes [provider]  carvedilol (COREG) 25 MG tablet Take 25 mg by mouth 2 (two) times daily  with a meal.   Yes [provider]  hydrALAZINE (APRESOLINE) 10 MG tablet Take 10 mg by mouth 3 (three) times daily. In addition to 25 mg tablet for a total of 35 mg 3 times daily. 11/30/21  Yes [provider]  hydrALAZINE (APRESOLINE) 25 MG tablet Take one tablet by mouth 3 times daily. Take un addition to a 10 mg tablet for a total of 35 mg 3 times daily. 12/09/21  Yes [provider]  cyanocobalamin 500 MCG tablet Take 500 mcg by mouth daily.    [provider]  doxycycline (VIBRAMYCIN) 100 MG capsule Take 1 capsule (100 mg total) by mouth 2 (two) times daily. Patient not taking: Reported on 12/22/2021 01/11/19   Tommie Sams, DO  hydrALAZINE (APRESOLINE) 50 MG tablet Take 50 mg by mouth 3 (three) times daily. Patient not taking: Reported on 12/22/2021    [provider]  Multiple Vitamin (MULTIVITAMIN) capsule Take 1 capsule by mouth daily.    [provider]  Omega-3 Fatty Acids (FISH OIL PO) Take by mouth daily.    [provider]  omeprazole (PRILOSEC OTC) 20 MG tablet Take 20 mg by mouth daily.    [provider]  Turmeric 400 MG CAPS Take by mouth.    [provider]  DG Chest 1 View  Result Date: 12/22/2021 CLINICAL DATA:  Left hip pain.  Status post fall. EXAM: CHEST  1 VIEW COMPARISON:  01/05/2015 FINDINGS: The heart size and mediastinal contours are within normal limits. Aortic atherosclerotic calcifications. Both lungs are clear. The visualized skeletal structures are unremarkable. IMPRESSION: No active disease. Electronically Signed   By: Signa Kell M.D.   On: 12/22/2021 19:09   CT HEAD WO CONTRAST ( )  Result Date: 12/22/2021 CLINICAL DATA:  Larey Seat backwards, head trauma EXAM: CT HEAD WITHOUT CONTRAST TECHNIQUE: Contiguous axial images were obtained from the base of the skull through the vertex without intravenous contrast. COMPARISON:  None. FINDINGS: Brain: No acute infarct or hemorrhage.  Lateral ventricles and midline structures are unremarkable. No acute extra-axial fluid collections. No mass effect. Vascular: No hyperdense vessel or unexpected calcification. Skull: Normal. Negative for fracture or focal lesion. Sinuses/Orbits: No acute finding. Other: None. IMPRESSION: 1. No acute intracranial process. Electronically Signed   By: Sharlet Salina M.D.   On: 12/22/2021 20:13   CT Cervical Spine Wo Contrast  Result Date: 12/22/2021 CLINICAL DATA:  Larey Seat backwards, neck trauma EXAM: CT CERVICAL SPINE WITHOUT CONTRAST TECHNIQUE: Multidetector CT imaging of the cervical spine was performed without intravenous contrast. Multiplanar CT image reconstructions were also generated. COMPARISON:  None. FINDINGS: Alignment: Minimal anterolisthesis of C3 relative to C4 likely due to degenerative change. Otherwise alignment is anatomic. Skull base and vertebrae: No acute fracture. No primary bone lesion or focal pathologic process. Soft tissues and spinal canal: No prevertebral fluid or swelling. No visible canal hematoma. Disc levels: Multilevel cervical spondylosis greatest at C4-5, C5-6, and C6-7. Multilevel facet hypertrophy greatest at C3-4. Bony fusion across the C4-5 facet joints on the left. Upper chest: Central airway is patent.  Lung apices are clear. Other: Reconstructed images demonstrate no additional findings. IMPRESSION: 1. No acute cervical spine fracture. 2. Multilevel degenerative changes as above. Electronically Signed   By: Sharlet Salina M.D.   On: 12/22/2021 20:11   DG Hip Unilat W or Wo Pelvis 2-3 Views Left  Result Date: 12/22/2021 CLINICAL DATA:  Status post fall EXAM: DG HIP (WITH OR WITHOUT PELVIS) 2-3V LEFT COMPARISON:  None. FINDINGS: Bones are diffusely osteopenic. There is an acute, comminuted intertrochanteric fracture involving the proximal left femur. Mild impaction of the fracture fragments identified. Large stool burden is incidentally noted within the colon and rectum.  IMPRESSION: Acute, comminuted intertrochanteric fracture of the proximal left femur. Electronically Signed   By: Signa Kell M.D.   On: 12/22/2021 19:08    Positive ROS: All other systems have been reviewed and were otherwise negative with the exception of those mentioned in the HPI and as above.  Physical Exam: General: Alert, no acute distress  MUSCULOSKELETAL: Left lower extremity: Patient has shortening and external rotation left lower extremity.  Her skin is intact overlying the left hip.  Her compartments of the thigh and lower leg are soft and compressible.  She has palpable pedal pulses, intact sensation light touch and intact motor function in the left lower extremity.  Assessment: Displaced left intertrochanteric hip fracture  Plan: I reviewed the details of the operation as well as the postoperative course with the patient and her daughter.  I marked the left hip according hospital's quickset of surgery protocol.  I discussed with the patient and her daughter the risks and benefits of surgery. They understand the risks include but are not limited to infection, bleeding requiring blood transfusion, nerve or blood vessel  injury, joint stiffness or loss of motion, persistent pain, weakness or instability, malunion, nonunion, leg length discrepancy, change in lower extremity rotation and hardware failure and the need for further surgery. Medical risks include but are not limited to DVT and pulmonary embolism, myocardial infarction, stroke, pneumonia, respiratory failure and death. Patient understood these risks and wished to proceed.   I have personally reviewed the patient's x-rays in preparation for this case.  The patient has been cleared by the hospitalist service for surgery.   Juanell Fairly, MD    12/23/2021 10:53 AM

## 2021-12-24 ENCOUNTER — Other Ambulatory Visit: Payer: Self-pay

## 2021-12-24 ENCOUNTER — Encounter: Payer: Self-pay | Admitting: Orthopedic Surgery

## 2021-12-24 LAB — BASIC METABOLIC PANEL
Anion gap: 5 (ref 5–15)
BUN: 28 mg/dL — ABNORMAL HIGH (ref 8–23)
CO2: 23 mmol/L (ref 22–32)
Calcium: 8.1 mg/dL — ABNORMAL LOW (ref 8.9–10.3)
Chloride: 105 mmol/L (ref 98–111)
Creatinine, Ser: 1.24 mg/dL — ABNORMAL HIGH (ref 0.44–1.00)
GFR, Estimated: 41 mL/min — ABNORMAL LOW (ref >60)
Glucose, Bld: 119 mg/dL — ABNORMAL HIGH (ref 70–99)
Potassium: 4 mmol/L (ref 3.5–5.1)
Sodium: 133 mmol/L — ABNORMAL LOW (ref 135–145)

## 2021-12-24 LAB — CBC
HCT: 26.5 % — ABNORMAL LOW (ref 36.0–46.0)
Hemoglobin: 8.7 g/dL — ABNORMAL LOW (ref 12.0–15.0)
MCH: 31 pg (ref 26.0–34.0)
MCHC: 32.8 g/dL (ref 30.0–36.0)
MCV: 94.3 fL (ref 80.0–100.0)
Platelets: 163 10*3/uL (ref 150–400)
RBC: 2.81 MIL/uL — ABNORMAL LOW (ref 3.87–5.11)
RDW: 12.6 % (ref 11.5–15.5)
WBC: 10 10*3/uL (ref 4.0–10.5)
nRBC: 0 % (ref 0.0–0.2)

## 2021-12-24 MED ORDER — TRAMADOL HCL 50 MG PO TABS
50.0000 mg | ORAL_TABLET | Freq: Four times a day (QID) | ORAL | Status: DC | PRN
  Administered 2021-12-24 – 2021-12-28 (×7): 50 mg via ORAL
  Filled 2021-12-24 (×8): qty 1

## 2021-12-24 MED ORDER — LIDOCAINE 5 % EX PTCH
1.0000 | MEDICATED_PATCH | CUTANEOUS | Status: DC
  Administered 2021-12-24 – 2021-12-28 (×4): 1 via TRANSDERMAL
  Filled 2021-12-24 (×5): qty 1

## 2021-12-24 NOTE — Evaluation (Signed)
Occupational Therapy Evaluation Patient Details Name: Daisy Mora MRN: [id number] DOB: 06-Dec-1926 Today's Date: 12/24/2021   History of Present Illness Daisy Mora is a 89yoF who comes [facility] on 12/22/21 after fall, imaging revealing of Lt femur fracture. Pt went to theater with Dr. Scherry Ran on 12/29 for Left femur ORIF c IM hardware. Pt was carryng laundry from kitchen to next room when she tripped over a single step threshold. At baseline pt live3s alone, fully independent, no previous falls, uses a SPC when AMB outside of home.   Clinical Impression   Patient presenting with decreased Ind in self care, balance, functional mobility/transfers, endurance, and safety awareness. Patient reports being mod I at baseline and living at home alone and independently. Pt would furniture cruise within the home and use [facility] for mobility in the community. She performs all IADL tasks independently and drives. Patient currently needing mod - +2 assistance for bed mobility and functional transfer. Pt does have c/o L knee pain as well which limits pt during session. Patient will benefit from acute OT to increase overall independence in the areas of ADLs, functional mobility, and safety awareness in order to safely discharge to next venue of care.      Recommendations for follow up therapy are one component of a multi-disciplinary discharge planning process, led by the attending physician.  Recommendations may be updated based on patient status, additional functional criteria and insurance authorization.   Follow Up Recommendations  Skilled nursing-short term rehab (<3 hours/day)    Assistance Recommended at Discharge Frequent or constant Supervision/Assistance  Functional Status Assessment  Patient has had a recent decline in their functional status and demonstrates the ability to make significant improvements in function in a reasonable and predictable amount of time.  Equipment Recommendations   Other (comment) (defer to next venue of care)    Recommendations for Other Services       Precautions / Restrictions Precautions Precautions: Fall      Mobility Bed Mobility                    Transfers Overall transfer level: Needs assistance       Stand pivot transfers: Max assist;+2 safety/equipment                    ADL either performed or assessed with clinical judgement   ADL Overall ADL's : Needs assistance/impaired     Grooming: Wash/dry hands;Wash/dry face;Oral care;Set up;Sitting               Lower Body Dressing: Maximal assistance                       Vision Patient Visual Report: No change from baseline              Pertinent Vitals/Pain Pain Assessment: 0-10 Pain Score: 6  Pain Location: Left knee Pain Descriptors / Indicators: Aching;Discomfort Pain Intervention(s): Monitored during session;Repositioned     Hand Dominance Right   Extremity/Trunk Assessment Upper Extremity Assessment Upper Extremity Assessment: Generalized weakness   Lower Extremity Assessment Lower Extremity Assessment: Generalized weakness       Communication Communication Communication: No difficulties   Cognition Arousal/Alertness: Awake/alert Behavior During Therapy: WFL for tasks assessed/performed Overall Cognitive Status: Within Functional Limits for tasks assessed  Home Living Family/patient expects to be discharged to:: Private residence Living Arrangements: Alone Available Help at Discharge: Other (Comment) (multiple family members nearby) Type of Home: House Home Access: Level entry     Home Layout: One level;Multi-level Alternate Level Stairs-Number of Steps: 1 step down to kitchen   Bathroom Shower/Tub: Tub/shower unit         Home Equipment: Cane - single point          Prior Functioning/Environment               Mobility  Comments: SPC when out of house, first fall ADLs Comments: Pt reports Ind with self care, IADLs, and driving. She has a paid person to come 1x/wk for deep cleaning needs within home.        OT Problem List: Decreased strength;Decreased activity tolerance;Impaired balance (sitting and/or standing);Decreased knowledge of precautions;Decreased knowledge of use of DME or AE;Decreased safety awareness;Pain      OT Treatment/Interventions: Self-care/ADL training;Therapeutic exercise;Therapeutic activities;Balance training;Manual therapy;Patient/family education;DME and/or AE instruction;Energy conservation;Cognitive remediation/compensation    OT Goals(Current goals can be found in the care plan section) Acute Rehab OT Goals Patient Stated Goal: to get stronger and go home OT Goal Formulation: With patient Time For Goal Achievement: 01/07/22 Potential to Achieve Goals: Good ADL Goals Pt Will Perform Grooming: with supervision Pt Will Perform Lower Body Dressing: with supervision Pt Will Transfer to Toilet: with supervision Pt Will Perform Toileting - Clothing Manipulation and hygiene: with supervision;sit to/from stand  OT Frequency: Min 2X/week   Barriers to D/C: Decreased caregiver support  none known at this time          AM-PAC OT "6 Clicks" Daily Activity     Outcome Measure Help from another person eating meals?: None Help from another person taking care of personal grooming?: A Little Help from another person toileting, which includes using toliet, bedpan, or urinal?: Total Help from another person bathing (including washing, rinsing, drying)?: A Lot Help from another person to put on and taking off regular upper body clothing?: A Little Help from another person to put on and taking off regular lower body clothing?: A Lot 6 Click Score: 15   End of Session Nurse Communication: Mobility status  Activity Tolerance: Patient limited by pain Patient left: in chair;with call  bell/phone within reach;with chair alarm set  OT Visit Diagnosis: Unsteadiness on feet (R26.81);Repeated falls (R29.6);Muscle weakness (generalized) (M62.81)                Time: 5400-8676 OT Time Calculation (min): 20 min Charges:  OT General Charges $OT Visit: 1 Visit OT Evaluation $OT Eval Moderate Complexity: 1 Mod OT Treatments $Self Care/Home Management : 8-22 mins  Jackquline Denmark, MS, OTR/L , CBIS ascom [phone]  12/24/21, 3:09 PM

## 2021-12-24 NOTE — TOC Initial Note (Addendum)
Transition of Care [facility]) - Initial/Assessment Note    Patient Details  Name: Daisy Mora MRN: [id number] Date of Birth: 11/28/1926  Transition of Care [facility]) CM/SW Contact:    Magnus Ivan, LCSW Phone Number: 12/24/2021, 1:39 PM  Clinical Narrative:              CSW met with patient at bedside. Patient lives alone and drives herself to appointments. Daughters are involved and supportive. PCP is [facility]. Pharmacy is [facility]. Patient has a cane. No HH or SNF history. Explained PT rec for SNF. Patient defers to her daughter Jeannene Patella to discuss SNF rec, says she is not sure what her daughters are planning. Called Pam, left a VM requesting a return call.   3:00- Met with daughter and patient at bedside. They are agreeable to SNF. Provided info for Medicare.gov Care Compare. Top choice is [facility]. CSW is starting SNF work up. Asked Magda Paganini with [facility] to review. Patient has had 2 COVID vaccines and the booster vaccines.  4:15- Call from [facility] with [facility] who stated they could take patient, will have a bed on Tuesday. Cannot take patients Monday due to their pharmacy hours. Called daughter Karenann Cai (per daughter Pam's request) who are in agreement with patient going to [facility] on Tuesday. CSW will leave handoff for TOC to start auth for Tuesday.  Updated MD and RN.  Expected Discharge Plan: Skilled Nursing Facility Barriers to Discharge: Continued Medical Work up   Patient Goals and CMS Choice Patient states their goals for this hospitalization and ongoing recovery are:: possibly SNF CMS Medicare.gov Compare Post Acute Care list provided to:: Patient Choice offered to / list presented to : Patient, Adult Children  Expected Discharge Plan and Services Expected Discharge Plan: [place] arrangements for the past 2 months: Single Family Home                                      Prior Living  Arrangements/Services Living arrangements for the past 2 months: Single Family Home Lives with:: Self Patient language and need for interpreter reviewed:: Yes Do you feel safe going back to the place where you live?: Yes      Need for Family Participation in Patient Care: Yes (Comment) Care giver support system in place?: Yes (comment) Current home services: DME Criminal Activity/Legal Involvement Pertinent to Current Situation/Hospitalization: No - Comment as needed  Activities of Daily Living Home Assistive Devices/Equipment: Cane (specify quad or straight) (straight) ADL Screening (condition at time of admission) Patient's cognitive ability adequate to safely complete daily activities?: Yes Is the patient deaf or have difficulty hearing?: No Does the patient have difficulty seeing, even when wearing glasses/contacts?: No Does the patient have difficulty concentrating, remembering, or making decisions?: No Patient able to express need for assistance with ADLs?: Yes Does the patient have difficulty dressing or bathing?: No Independently performs ADLs?: Yes (appropriate for developmental age) Does the patient have difficulty walking or climbing stairs?: Yes Weakness of Legs: Both Weakness of Arms/Hands: None  Permission Sought/Granted Permission sought to share information with : Facility Sport and exercise psychologist, Family Supports Permission granted to share information with : Yes, Verbal Permission Granted  Share Information with NAME: Pam - daughter  Permission granted to share info w AGENCY: HH, DME, SNF        Emotional Assessment  Orientation: : Oriented to Self, Oriented to Place, Oriented to  Time, Oriented to Situation Alcohol / Substance Use: Not Applicable Psych Involvement: No (comment)  Admission diagnosis:  Surgery, elective [Z41.9] Fall [W19.XXXA] Femur fracture, left ([place]) [S72.92XA] Closed displaced intertrochanteric fracture of left femur, initial  encounter [facility]) [S72.142A] Patient Active Problem List   Diagnosis Date Noted   Acquired hypothyroidism 12/23/2021   Femur fracture, left ([place]) 12/22/2021   Essential hypertension 12/22/2021   GERD (gastroesophageal reflux disease) 12/22/2021   PCP:  System, Provider Not In Pharmacy:   [place], [address] [place] [address] Phone: [phone] Fax: [phone]     Social Determinants of Health (SDOH) Interventions    Readmission Risk Interventions Readmission Risk Prevention Plan 12/24/2021  Transportation Screening Complete  PCP or Specialist Appt within 5-7 Days Complete  Home Care Screening Complete  Medication Review (RN CM) Complete  Some recent data might be hidden

## 2021-12-24 NOTE — Progress Notes (Signed)
Physical Therapy Treatment Patient Details Name: Daisy Mora MRN: [id number] DOB: 03-26-1926 Today's Date: 12/24/2021   History of Present Illness Daisy Mora is a 89yoF who comes to [facility] on 12/22/21 after fall, imaging revealing of Lt femur fracture. Pt went to theater with Dr. Scherry Ran on 12/29 for Left femur ORIF Mora IM hardware. Pt was carryng laundry from kitchen to next room when she tripped over a single step threshold. At baseline pt live3s alone, fully independent, no previous falls, uses a SPC when AMB outside of home.    PT Comments    Pt in bed again, DTR now here. Pt agreeable to session, lunch eaten, meds received, nausea extinguished. MinA to EOB, modA to stand with RW, but from elevated surface can perform at minguardA, Chartered loss adjuster notes frequent Rt knee creaking each time, but it's the LEFT knee that continues to improve painful prohibition of weightbearing and mobility. Per DTR and Pt, sounds as though pt has Hx of advanced DJD in Left knee PTA including bowing of the joint. Pt does have improved pain and reduced stiffness after some gentle AA/ROM of knee in bed, but knee pain remains more painful than hip fracture pain. Pt left at EOB DTR at bedside at exit, pt encouraged to sit up for 30 minutes for pulmonary toilet.     Recommendations for follow up therapy are one component of a multi-disciplinary discharge planning process, led by the attending physician.  Recommendations may be updated based on patient status, additional functional criteria and insurance authorization.  Follow Up Recommendations  Skilled nursing-short term rehab (<3 hours/day)     Assistance Recommended at Discharge Intermittent Supervision/Assistance  Equipment Recommendations  Rolling walker (2 wheels)    Recommendations for Other Services       Precautions / Restrictions Precautions Precautions: Fall Restrictions Weight Bearing Restrictions: Yes LLE Weight Bearing: Weight bearing as  tolerated     Mobility  Bed Mobility Overal bed mobility: Needs Assistance Bed Mobility: Supine to Sit     Supine to sit: Min assist     General bed mobility comments: author helps with Left leg only (AA/ROM to EOB and down to floor)    Transfers Overall transfer level: Needs assistance Equipment used: Rolling walker (2 wheels) Transfers: Sit to/from Stand Sit to Stand: Mod assist Stand pivot transfers: Max assist;+2 safety/equipment              Ambulation/Gait Ambulation/Gait assistance:  (unablee)                 Stairs             Wheelchair Mobility    Modified Rankin (Stroke Patients Only)       Balance                                            Cognition Arousal/Alertness: Awake/alert Behavior During Therapy: WFL for tasks assessed/performed Overall Cognitive Status: Within Functional Limits for tasks assessed                                          Exercises Total Joint Exercises Short Arc Quad: AROM;Right;15 reps;Supine General Exercises - Lower Extremity Short Arc Quad: AAROM;Left;15 reps;Supine Hip Flexion/Marching: AROM;Left;10 reps;Seated Other Exercises Other Exercises: STS from elevated EOB  Mora RW x20-30sec standing 5x in session, moved to YRW to promoted improved BUE ergonomics.    General Comments        Pertinent Vitals/Pain Pain Assessment: Faces Pain Score: 6  Faces Pain Scale: Hurts even more Pain Location: Left knee Pain Descriptors / Indicators: Aching;Discomfort Pain Intervention(s): Monitored during session;Repositioned    Home Living Family/patient expects to be discharged to:: Private residence Living Arrangements: Alone Available Help at Discharge: Other (Comment) (multiple family members nearby) Type of Home: House Home Access: Level entry     Alternate Level Stairs-Number of Steps: 1 step down to kitchen Home Layout: One level;Multi-level Home Equipment: Cane  - single point      Prior Function            PT Goals (current goals can now be found in the care plan section) Acute Rehab PT Goals Patient Stated Goal: return to living independently at home PT Goal Formulation: With patient Time For Goal Achievement: 01/07/22 Potential to Achieve Goals: Fair Progress towards PT goals: Progressing toward goals    Frequency    BID      PT Plan Current plan remains appropriate    Co-evaluation              AM-PAC PT "6 Clicks" Mobility   Outcome Measure  Help needed turning from your back to your side while in a flat bed without using bedrails?: A Lot Help needed moving from lying on your back to sitting on the side of a flat bed without using bedrails?: A Lot Help needed moving to and from a bed to a chair (including a wheelchair)?: A Lot Help needed standing up from a chair using your arms (e.g., wheelchair or bedside chair)?: A Lot Help needed to walk in hospital room?: Total Help needed climbing 3-5 steps with a railing? : Total 6 Click Score: 10    End of Session Equipment Utilized During Treatment: Gait belt Activity Tolerance: Patient tolerated treatment well;Patient limited by fatigue;Patient limited by pain Patient left: in bed;with call bell/phone within reach;with family/visitor present Nurse Communication: Mobility status PT Visit Diagnosis: Difficulty in walking, not elsewhere classified (R26.2);Other abnormalities of gait and mobility (R26.89)     Time: 2440-1027 PT Time Calculation (min) (ACUTE ONLY): 27 min  Charges:  $Therapeutic Exercise: 23-37 mins                    3:44 PM, 12/24/21 Daisy Mora, PT, DPT Physical Therapist - [facility]  [phone] (ASCOM)     Daisy Mora 12/24/2021, 3:40 PM

## 2021-12-24 NOTE — Progress Notes (Signed)
PROGRESS NOTE    Daisy Mora  GNF:[id number] DOB: 1926/08/29 DOA: 12/22/2021 PCP: System, Provider Not In   Brief Narrative:  Daisy Mora is a 87 y.o. female with medical history significant for hypertension, GERD, who presents emergency department for chief concerns of a fall. Noted a mechanical fall where she tripped, lost her balance, with laundry in her arms and unable to catch herself. She reports that when she fell she did not hit her head, loss of consciousness, or pass out.  She states that the pain was not immediate.  She states that when she attempts to ambulate or get up and or bear weight, the pain was a 10 out of 10. Presented to the ED where imaging confirmed Left femur fracture - ortho called for consult   Assessment & Plan:  Acute comminuted intertrochanteric fracture of the left proximal femur -Secondary to mechanical fall  -Orthopedics following, status post ORIF 12/29 - tolerated well -Discontinue p.o. narcotics given nausea overnight, transition to tramadol as needed with breakthrough IV narcotics only as necessary -PT OT to follow, likely disposition SNF given patient's age and fracture type   Essential hypertension, uncontrolled  -Likely uncontrolled in the setting of pain, continue Coreg hydralazine amlodipine Home meds  -Continue as needed labetalol    Hypothyroidism, stable -continue levothyroxine 25 mcg daily GERD-PPI resumed   DVT prophylaxis: Per orthopedics Code Status: DNR Family Communication: At bedside  Status is: Inpatient  Dispo: The patient is from: Home              Anticipated d/c is to: To be determined              Anticipated d/c date is: 48 to 72 hours              Patient currently not medically stable for discharge  Consultants:  Orthopedic surgery  Procedures:  ORIF left femur 12/23/2021  Antimicrobials:  None indicated  Subjective: Intractable nausea vomiting overnight after Vicodin administration, otherwise denies  headache fever chills chest pain shortness of breath diarrhea constipation.  Pain currently well controlled in the hip, left knee pain which is chronic appears to be her main complaint today.  Objective: Vitals:   12/23/21 1536 12/23/21 2027 12/23/21 2321 12/24/21 0312  BP: (!) 167/82 (!) 130/49 (!) 140/48 (!) 143/48  Pulse: 97 71 70 78  Resp: Temp: 97.7 F (36.5 C) 97.6 F (36.4 C) 97.9 F (36.6 C) 97.9 F (36.6 C)  TempSrc:      SpO2: 96% 96% 96% 96%  Weight:      Height:        Intake/Output Summary (Last 24 hours) at 12/24/2021 0736 Last data filed at 12/24/2021 0603 Gross per 24 hour  Intake 1100 ml  Output 1075 ml  Net 25 ml    Filed Weights   12/22/21 1745  Weight: 59 kg    Examination:  General:  Pleasantly resting in bed, No acute distress. HEENT:  Normocephalic atraumatic.  Sclerae nonicteric, noninjected.  Extraocular movements intact bilaterally. Neck:  Without mass or deformity.  Trachea is midline. Lungs:  Clear to auscultate bilaterally without rhonchi, wheeze, or rales. Heart:  Regular rate and rhythm.  Without murmurs, rubs, or gallops. Abdomen:  Soft, nontender, nondistended.  Without guarding or rebound. Extremities: Without cyanosis, clubbing, edema Vascular:  Dorsalis pedis and posterior tibial pulses palpable bilaterally. Skin:  Warm and dry, no erythema, no ulcerations, left hip skin bandage clean/dry/intact  Data Reviewed: I have personally reviewed following labs and imaging studies  CBC: Recent Labs  Lab 12/22/21 1945 12/23/21 0615  WBC 9.7 6.5  NEUTROABS 7.7  --   HGB 11.1* 10.0*  HCT 33.8* 29.9*  MCV 94.9 94.0  PLT 204 177    Basic Metabolic Panel: Recent Labs  Lab 12/22/21 1945 12/23/21 0615  NA 135 135  K 4.0 3.8  CL 108 109  CO2 22 23  GLUCOSE 120* 101*  BUN 30* 27*  CREATININE 1.27* 1.15*  CALCIUM 8.7* 8.3*    GFR: Estimated Creatinine Clearance: 25.3 mL/min (A) (by C-G formula based on SCr of  1.15 mg/dL (H)). Liver Function Tests: Recent Labs  Lab 12/22/21 1945  AST 22  ALT 16  ALKPHOS 57  BILITOT 0.8  PROT 6.6  ALBUMIN 3.7    No results for input(s): LIPASE, AMYLASE in the last 168 hours. No results for input(s): AMMONIA in the last 168 hours. Coagulation Profile: No results for input(s): INR, PROTIME in the last 168 hours. Cardiac Enzymes: No results for input(s): CKTOTAL, CKMB, CKMBINDEX, TROPONINI in the last 168 hours. BNP (last 3 results) No results for input(s): PROBNP in the last 8760 hours. HbA1C: No results for input(s): HGBA1C in the last 72 hours. CBG: No results for input(s): GLUCAP in the last 168 hours. Lipid Profile: No results for input(s): CHOL, HDL, LDLCALC, TRIG, CHOLHDL, LDLDIRECT in the last 72 hours. Thyroid Function Tests: No results for input(s): TSH, T4TOTAL, FREET4, T3FREE, THYROIDAB in the last 72 hours. Anemia Panel: No results for input(s): VITAMINB12, FOLATE, FERRITIN, TIBC, IRON, RETICCTPCT in the last 72 hours. Sepsis Labs: No results for input(s): PROCALCITON, LATICACIDVEN in the last 168 hours.  Recent Results (from the past 240 hour(s))  Resp Panel by RT-PCR (Flu A&B, Covid) Nasopharyngeal Swab     Status: None   Collection Time: 12/22/21  7:45 PM   Specimen: Nasopharyngeal Swab; Nasopharyngeal(NP) swabs in vial transport medium  Result Value Ref Range Status   SARS Coronavirus 2 by RT PCR NEGATIVE NEGATIVE Final    Comment: (NOTE) SARS-CoV-2 target nucleic acids are NOT DETECTED.  The SARS-CoV-2 RNA is generally detectable in upper respiratory specimens during the acute phase of infection. The lowest concentration of SARS-CoV-2 viral copies this assay can detect is 138 copies/mL. A negative result does not preclude SARS-Cov-2 infection and should not be used as the sole basis for treatment or other patient management decisions. A negative result may occur with  improper specimen collection/handling, submission of  specimen other than nasopharyngeal swab, presence of viral mutation(s) within the areas targeted by this assay, and inadequate number of viral copies(<138 copies/mL). A negative result must be combined with clinical observations, patient history, and epidemiological information. The expected result is Negative.  Fact Sheet for Patients:  BloggerCourse.com  Fact Sheet for Healthcare Providers:  SeriousBroker.it  This test is no t yet approved or cleared by the [place] FDA and  has been authorized for detection and/or diagnosis of SARS-CoV-2 by FDA under an Emergency Use Authorization (EUA). This EUA will remain  in effect (meaning this test can be used) for the duration of the COVID-19 declaration under Section 564(b)(1) of the Act, 21 U.S.C.section 360bbb-3(b)(1), unless the authorization is terminated  or revoked sooner.       Influenza A by PCR NEGATIVE NEGATIVE Final   Influenza B by PCR NEGATIVE NEGATIVE Final    Comment: (NOTE) The Xpert Xpress SARS-CoV-2/FLU/RSV plus assay is intended as an aid  in the diagnosis of influenza from Nasopharyngeal swab specimens and should not be used as a sole basis for treatment. Nasal washings and aspirates are unacceptable for Xpert Xpress SARS-CoV-2/FLU/RSV testing.  Fact Sheet for Patients: BloggerCourse.com  Fact Sheet for Healthcare Providers: SeriousBroker.it  This test is not yet approved or cleared by the [place] FDA and has been authorized for detection and/or diagnosis of SARS-CoV-2 by FDA under an Emergency Use Authorization (EUA). This EUA will remain in effect (meaning this test can be used) for the duration of the COVID-19 declaration under Section 564(b)(1) of the Act, 21 U.S.C. section 360bbb-3(b)(1), unless the authorization is terminated or revoked.  Performed at [facility], [address] [place], [place] [address]       Radiology Studies: DG Chest 1 View  Result Date: 12/22/2021 CLINICAL DATA:  Left hip pain.  Status post fall. EXAM: CHEST  1 VIEW COMPARISON:  01/05/2015 FINDINGS: The heart size and mediastinal contours are within normal limits. Aortic atherosclerotic calcifications. Both lungs are clear. The visualized skeletal structures are unremarkable. IMPRESSION: No active disease. Electronically Signed   By: Signa Kell M.D.   On: 12/22/2021 19:09   CT HEAD WO CONTRAST ( )  Result Date: 12/22/2021 CLINICAL DATA:  Larey Seat backwards, head trauma EXAM: CT HEAD WITHOUT CONTRAST TECHNIQUE: Contiguous axial images were obtained from the base of the skull through the vertex without intravenous contrast. COMPARISON:  None. FINDINGS: Brain: No acute infarct or hemorrhage. Lateral ventricles and midline structures are unremarkable. No acute extra-axial fluid collections. No mass effect. Vascular: No hyperdense vessel or unexpected calcification. Skull: Normal. Negative for fracture or focal lesion. Sinuses/Orbits: No acute finding. Other: None. IMPRESSION: 1. No acute intracranial process. Electronically Signed   By: Sharlet Salina M.D.   On: 12/22/2021 20:13   CT Cervical Spine Wo Contrast  Result Date: 12/22/2021 CLINICAL DATA:  Larey Seat backwards, neck trauma EXAM: CT CERVICAL SPINE WITHOUT CONTRAST TECHNIQUE: Multidetector CT imaging of the cervical spine was performed without intravenous contrast. Multiplanar CT image reconstructions were also generated. COMPARISON:  None. FINDINGS: Alignment: Minimal anterolisthesis of C3 relative to C4 likely due to degenerative change. Otherwise alignment is anatomic. Skull base and vertebrae: No acute fracture. No primary bone lesion or focal pathologic process. Soft tissues and spinal canal: No prevertebral fluid or swelling. No visible canal hematoma. Disc levels: Multilevel cervical spondylosis greatest at C4-5, C5-6, and C6-7. Multilevel  facet hypertrophy greatest at C3-4. Bony fusion across the C4-5 facet joints on the left. Upper chest: Central airway is patent.  Lung apices are clear. Other: Reconstructed images demonstrate no additional findings. IMPRESSION: 1. No acute cervical spine fracture. 2. Multilevel degenerative changes as above. Electronically Signed   By: Sharlet Salina M.D.   On: 12/22/2021 20:11   DG C-Arm 1-60 Min-No Report  Result Date: 12/23/2021 CLINICAL DATA:  Surgical internal fixation of left hip fracture. EXAM: DG HIP (WITH OR WITHOUT PELVIS) 2-3V LEFT; DG C-ARM 1-60 MIN-NO REPORT Radiation exposure index: 14.815 mGy. COMPARISON:  December 22, 2021. FINDINGS: Four intraoperative fluoroscopic images were obtained of the left femur. These images demonstrate surgical internal fixation of proximal left femoral intertrochanteric fracture with intramedullary rod fixation of the left femoral shaft. IMPRESSION: Fluoroscopic guidance provided during surgical internal fixation of proximal left femoral fracture Electronically Signed   By: Lupita Raider M.D.   On: 12/23/2021 12:59   DG HIP UNILAT WITH PELVIS 2-3 VIEWS LEFT  Result Date: 12/23/2021 CLINICAL DATA:  Surgical internal  fixation of left hip fracture. EXAM: DG HIP (WITH OR WITHOUT PELVIS) 2-3V LEFT; DG C-ARM 1-60 MIN-NO REPORT Radiation exposure index: 14.815 mGy. COMPARISON:  December 22, 2021. FINDINGS: Four intraoperative fluoroscopic images were obtained of the left femur. These images demonstrate surgical internal fixation of proximal left femoral intertrochanteric fracture with intramedullary rod fixation of the left femoral shaft. IMPRESSION: Fluoroscopic guidance provided during surgical internal fixation of proximal left femoral fracture Electronically Signed   By: Lupita Raider M.D.   On: 12/23/2021 12:59   DG Hip Unilat W or Wo Pelvis 2-3 Views Left  Result Date: 12/22/2021 CLINICAL DATA:  Status post fall EXAM: DG HIP (WITH OR WITHOUT PELVIS)  2-3V LEFT COMPARISON:  None. FINDINGS: Bones are diffusely osteopenic. There is an acute, comminuted intertrochanteric fracture involving the proximal left femur. Mild impaction of the fracture fragments identified. Large stool burden is incidentally noted within the colon and rectum. IMPRESSION: Acute, comminuted intertrochanteric fracture of the proximal left femur. Electronically Signed   By: Signa Kell M.D.   On: 12/22/2021 19:08   DG FEMUR PORT MIN 2 VIEWS LEFT  Result Date: 12/23/2021 CLINICAL DATA:  Status post IM nail placement EXAM: LEFT FEMUR PORTABLE 2 VIEWS COMPARISON:  None. FINDINGS: Left intertrochanteric fracture transfixed with a intramedullary nail and interlocking femoral neck screw. No hardware failure or complication. Near anatomic alignment. Postsurgical changes in the soft tissues overlying the left hip. 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 IMPRESSION: Interval ORIF of a left intertrochanteric fracture. Electronically Signed   By: Elige Ko M.D.   On: 12/23/2021 14:44        Scheduled Meds:  acetaminophen  500 mg Oral Q6H   amLODipine  10 mg Oral Daily   carvedilol  25 mg Oral BID WC   Chlorhexidine Gluconate Cloth  6 each Topical Daily   docusate sodium  100 mg Oral BID   enoxaparin (LOVENOX) injection  30 mg Subcutaneous Q24H   famotidine  20 mg Oral Daily   feeding supplement  237 mL Oral BID BM   hydrALAZINE  25 mg Oral TID   levothyroxine  25 mcg Oral Q0600   multivitamin with minerals  1 tablet Oral Daily   pantoprazole  20 mg Oral Daily   senna  1 tablet Oral BID   traMADol  50 mg Oral Q6H   Continuous Infusions:  methocarbamol (ROBAXIN) IV       LOS: 2 days   Time spent:  Azucena Fallen, DO [facility]  If 7PM-7AM, please contact night-coverage [email or web address]  12/24/2021, 7:36 AM

## 2021-12-24 NOTE — [place] FL2 (Signed)
[place] MEDICAID FL2 LEVEL OF CARE SCREENING TOOL     IDENTIFICATION  Patient Name: Daisy Mora Birthdate: 13-Nov-1926 Sex: female Admission Date (Current Location): 12/22/2021  [facility] and [place] Number:  Chiropodist and Address:  [facility], [address], [place], [place] [address]      Provider Number: [id number]  Attending Physician Name and Address:  Azucena Fallen, MD  Relative Name and Phone Number:  Sudie Grumbling (Daughter)   [phone] [facility] Phone)    Current Level of Care: Hospital Recommended Level of Care: Skilled Nursing Facility Prior Approval Number:    Date Approved/Denied:   PASRR Number: [id number]  Discharge Plan:      Current Diagnoses: Patient Active Problem List   Diagnosis Date Noted   Acquired hypothyroidism 12/23/2021   Femur fracture, left (HCC) 12/22/2021   Essential hypertension 12/22/2021   GERD (gastroesophageal reflux disease) 12/22/2021    Orientation RESPIRATION BLADDER Height & Weight     Self, Time, Situation, Place  Normal External catheter Weight: 130 lb 1.1 oz (59 kg) Height:  5\' 3"  (160 cm)  BEHAVIORAL SYMPTOMS/MOOD NEUROLOGICAL BOWEL NUTRITION STATUS        Diet (regular diet, thin liquids)  AMBULATORY STATUS COMMUNICATION OF NEEDS Skin   Limited Assist Verbally Surgical wounds (L hip)                       Personal Care Assistance Level of Assistance  Bathing, Feeding, Dressing Bathing Assistance: Limited assistance Feeding assistance: Limited assistance Dressing Assistance: Limited assistance     Functional Limitations Info             SPECIAL CARE FACTORS FREQUENCY  PT (By licensed PT), OT (By licensed OT)     PT Frequency: 5 times per week OT Frequency: 5 times per week            Contractures      Additional Factors Info  Code Status, Allergies Code Status Info: DNR Allergies Info: aspirin           Current Medications  (12/24/2021):  This is the current hospital active medication list Current Facility-Administered Medications  Medication Dose Route Frequency Provider Last Rate Last Admin   acetaminophen (TYLENOL) tablet 325-650 mg  325-650 mg Oral Q6H PRN 12/26/2021, MD       acetaminophen (TYLENOL) tablet 500 mg  500 mg Oral Q6H Juanell Fairly, MD   500 mg at 12/24/21 1243   alum & mag hydroxide-simeth (MAALOX/MYLANTA) 200-200-20 MG/5ML suspension 30 mL  30 mL Oral Q4H PRN 07-04-2001, MD   30 mL at 12/23/21 2337   amLODipine (NORVASC) tablet 10 mg  10 mg Oral Daily 2338, MD   10 mg at 12/24/21 12/26/21   bisacodyl (DULCOLAX) suppository 10 mg  10 mg Rectal Daily PRN 1829, MD       carvedilol (COREG) tablet 25 mg  25 mg Oral BID WC Juanell Fairly, MD   25 mg at 12/24/21 12/26/21   Chlorhexidine Gluconate Cloth 2 % PADS 6 each  6 each Topical Daily Cox, Amy N, DO   6 each at 12/24/21 0948   docusate sodium (COLACE) capsule 100 mg  100 mg Oral BID 12/26/21, MD   100 mg at 12/24/21 0947   enoxaparin (LOVENOX) injection 30 mg  30 mg Subcutaneous Q24H 12/26/21, MD   30 mg at 12/24/21 0948   famotidine (PEPCID) tablet 20 mg  20 mg Oral Daily Juanell Fairly, MD   20 mg at 12/24/21 0947   feeding supplement (ENSURE ENLIVE / ENSURE PLUS) liquid 237 mL  237 mL Oral BID BM Juanell Fairly, MD   237 mL at 12/24/21 1252   hydrALAZINE (APRESOLINE) tablet 25 mg  25 mg Oral TID Juanell Fairly, MD   25 mg at 12/24/21 0946   levothyroxine (SYNTHROID) tablet 25 mcg  25 mcg Oral Q0600 Juanell Fairly, MD   25 mcg at 12/24/21 0540   lidocaine (LIDODERM) 5 % 1 patch  1 patch Transdermal Q24H Azucena Fallen, MD   1 patch at 12/24/21 1120   methocarbamol (ROBAXIN) tablet 500 mg  500 mg Oral Q6H PRN Juanell Fairly, MD   500 mg at 12/24/21 0321   Or   methocarbamol (ROBAXIN) 500 mg in dextrose 5 % 50 mL IVPB  500 mg Intravenous Q6H PRN Juanell Fairly, MD       morphine 2  MG/ML injection 0.5-1 mg  0.5-1 mg Intravenous Q2H PRN Juanell Fairly, MD   0.5 mg at 12/23/21 1607   multivitamin with minerals tablet 1 tablet  1 tablet Oral Daily Juanell Fairly, MD   1 tablet at 12/24/21 0945   ondansetron [facility]) tablet 4 mg  4 mg Oral Q6H PRN Juanell Fairly, MD       Or   ondansetron [facility]) injection 4 mg  4 mg Intravenous Q6H PRN Juanell Fairly, MD   4 mg at 12/24/21 1052   pantoprazole (PROTONIX) EC tablet 20 mg  20 mg Oral Daily Juanell Fairly, MD   20 mg at 12/24/21 0947   polyethylene glycol (MIRALAX / GLYCOLAX) packet 17 g  17 g Oral Daily PRN Juanell Fairly, MD       senna [facility]) tablet 8.6 mg  1 tablet Oral BID Juanell Fairly, MD   8.6 mg at 12/24/21 0947   traMADol (ULTRAM) tablet 50 mg  50 mg Oral Q6H PRN Azucena Fallen, MD   50 mg at 12/24/21 1243     Discharge Medications: Please see discharge summary for a list of discharge medications.  Relevant Imaging Results:  Relevant Lab Results:   Additional Information SS #: [id number]  Demarquis Osley [place], LCSW

## 2021-12-24 NOTE — Evaluation (Signed)
Physical Therapy Evaluation Patient Details Name: Daisy Mora MRN: [id number] DOB: [birth year] Today's Date: 12/24/2021  History of Present Illness  Daisy Mora is a 89yoF who comes to [facility] on 12/22/21 after fall, imaging revealing of Lt femur fracture. Pt went to theater with Dr. Scherry Ran on 12/29 for Left femur ORIF c IM hardware. Pt was carryng laundry from kitchen to next room when she tripped over a single step threshold. At baseline pt live3s alone, fully independent, no previous falls, uses a SPC when AMB outside of home.  Clinical Impression  Pt admitted with above diagnosis. Pt currently with functional limitations due to the deficits listed below (see "PT Problem List"). Patient agreeable to PT evaluation. Patient provides detailed description of PLOF and home environment. Pt having a lot of pain in left knee which is limiting, also limited by nausea at EOB. Pt requires minA to EOB, ModA to rise to standing, totalA for safe pivot to recliner. Unable attempt any walking this date. Patient's assessment this date reveals the patient requires an additional person present for safety and/or physical assistance to complete their typical ADL. At baseline, the patient is able to perform ADL with modified independence. Patient will benefit from skilled PT intervention to maximize independence and safety in mobility required for basic ADL performance at discharge.          Recommendations for follow up therapy are one component of a multi-disciplinary discharge planning process, led by the attending physician.  Recommendations may be updated based on patient status, additional functional criteria and insurance authorization.  Follow Up Recommendations Skilled nursing-short term rehab (<3 hours/day)    Assistance Recommended at Discharge Intermittent Supervision/Assistance  Functional Status Assessment Patient has had a recent decline in their functional status and demonstrates the ability  to make significant improvements in function in a reasonable and predictable amount of time.  Equipment Recommendations  Rolling walker (2 wheels)    Recommendations for Other Services       Precautions / Restrictions Precautions Precautions: Fall Restrictions Weight Bearing Restrictions: No      Mobility  Bed Mobility Overal bed mobility: Needs Assistance Bed Mobility: Supine to Sit     Supine to sit: Min assist     General bed mobility comments: Thereasa Parkin helps with Left leg only (AA/ROM to EOB and down to floor)    Transfers Overall transfer level: Needs assistance   Transfers: Sit to/from Stand;Bed to chair/wheelchair/BSC Sit to Stand: Mod assist Stand pivot transfers: Max assist;+2 safety/equipment         General transfer comment: modA to rise, but pain in Left knee precludes any weightbearng for taking steps    Ambulation/Gait                  Stairs            Wheelchair Mobility    Modified Rankin (Stroke Patients Only)       Balance                                             Pertinent Vitals/Pain Pain Assessment: 0-10 Pain Score: 10-Worst pain ever Pain Location: Left knee Pain Descriptors / Indicators: Aching Pain Intervention(s): Limited activity within patient's tolerance;Monitored during session    Home Living Family/patient expects to be discharged to:: Private residence Living Arrangements: Alone Available Help at Discharge:  (1 DTR  in [place], 2 grandsons in [place], 2 DTR in [place]) Type of Home: House Home Access: Level entry     Alternate Level Stairs-Number of Steps: 1 step down to kitchen Home Layout: One level;Multi-level Home Equipment: Gilmer Mor - single point      Prior Function               Mobility Comments: SPC when out of house, first fall       Hand Dominance        Extremity/Trunk Assessment   Upper Extremity Assessment Upper Extremity Assessment: Generalized  weakness    Lower Extremity Assessment Lower Extremity Assessment: Generalized weakness       Communication      Cognition Arousal/Alertness: Awake/alert Behavior During Therapy: WFL for tasks assessed/performed Overall Cognitive Status: Within Functional Limits for tasks assessed                                          General Comments      Exercises     Assessment/Plan    PT Assessment Patient needs continued PT services  PT Problem List Decreased strength;Decreased range of motion;Decreased activity tolerance;Decreased balance;Decreased mobility;Decreased knowledge of use of DME;Decreased safety awareness;Decreased knowledge of precautions;Cardiopulmonary status limiting activity       PT Treatment Interventions DME instruction;Balance training;Gait training;Stair training;Functional mobility training;Therapeutic activities;Therapeutic exercise;Patient/family education    PT Goals (Current goals can be found in the Care Plan section)  Acute Rehab PT Goals Patient Stated Goal: return to living independently at home PT Goal Formulation: With patient Time For Goal Achievement: 01/07/22 Potential to Achieve Goals: Good    Frequency BID   Barriers to discharge Inaccessible home environment;Decreased caregiver support      Co-evaluation               AM-PAC PT "6 Clicks" Mobility  Outcome Measure Help needed turning from your back to your side while in a flat bed without using bedrails?: A Lot Help needed moving from lying on your back to sitting on the side of a flat bed without using bedrails?: A Lot Help needed moving to and from a bed to a chair (including a wheelchair)?: Total Help needed standing up from a chair using your arms (e.g., wheelchair or bedside chair)?: Total Help needed to walk in hospital room?: Total Help needed climbing 3-5 steps with a railing? : Total 6 Click Score: 8    End of Session   Activity Tolerance: Patient  limited by lethargy;Treatment limited secondary to medical complications (Comment) (nausea worse at EOB, waitin gon meds; Left knee pain also very limiting) Patient left: in chair;with nursing/sitter in room Nurse Communication: Mobility status PT Visit Diagnosis: Difficulty in walking, not elsewhere classified (R26.2);Other abnormalities of gait and mobility (R26.89)    Time: 1015-1040 PT Time Calculation (min) (ACUTE ONLY): 25 min   Charges:   PT Evaluation $PT Eval Moderate Complexity: 1 Mod PT Treatments $Therapeutic Activity: 8-22 mins       11:02 AM, 12/24/21 Rosamaria Lints, PT, DPT Physical Therapist - [facility]   [phone] (ASCOM)    Neelie Welshans C 12/24/2021, 11:01 AM

## 2021-12-24 NOTE — Anesthesia Postprocedure Evaluation (Signed)
Anesthesia Post Note  Patient: Daisy Mora  Procedure(s) Performed: INTRAMEDULLARY (IM) NAIL INTERTROCHANTRIC (Left: Hip)  Patient location during evaluation: Nursing Unit Anesthesia Type: Spinal Level of consciousness: oriented and awake and alert Pain management: pain level controlled Vital Signs Assessment: post-procedure vital signs reviewed and stable Respiratory status: spontaneous breathing and respiratory function stable Cardiovascular status: blood pressure returned to baseline and stable Postop Assessment: no headache, no backache, no apparent nausea or vomiting and patient able to bend at knees Anesthetic complications: no   No notable events documented.   Last Vitals:  Vitals:   12/23/21 2321 12/24/21 0312  BP: (!) 140/48 (!) 143/48  Pulse: 70 78  Resp: 16 16  Temp: 36.6 C 36.6 C  SpO2: 96% 96%    Last Pain:  Vitals:   12/23/21 2251  TempSrc:   PainSc: 5                  Joua Bake Lawerance Cruel

## 2021-12-24 NOTE — Progress Notes (Signed)
Nutrition Follow-up  DOCUMENTATION CODES:   Not applicable  INTERVENTION:   -Continue Ensure Enlive po BID, each supplement provides 350 kcal and 20 grams of protein  -Continue MVI with minerals daily   NUTRITION DIAGNOSIS:   Increased nutrient needs related to post-op healing as evidenced by estimated needs.  Ongoing  GOAL:   Patient will meet greater than or equal to 90% of their needs  Progressing   MONITOR:   PO intake, Supplement acceptance, Diet advancement, Labs, Weight trends, Skin, I & O's  REASON FOR ASSESSMENT:   Consult Assessment of nutrition requirement/status, Hip fracture protocol  ASSESSMENT:   Daisy Mora is a 87 y.o. female with medical history significant for hypertension, GERD, who presents emergency department for chief concerns of a fall.  12/29- s/p PROCEDURE: Intramedullary fixation for left displaced intertrochanteric hip fracture  Spoke with pt, who was sitting in recliner chair at time of visit. Pt reports "it feels good to sit up in the chair". Pt reports she has a poor appetite, consumed some eggs and toast for breakfast. PTA pt has a good appetite- consumes 3 meals per day (Breakfast: eggs, toast, sausage, and coffee; Lunch: vegetable plate or sandwich; Dinner: fruit). Pt still drives and prepares all of her meals.   Pt denies any weight loss. She reports her UBW is around 133#. Reviewed wt hx; wt has been stable over the past 4 years.   Discussed importance of good meal and supplement intake to promote healing. Pt amenable to Ensure supplements.   Pt with generalized fat and muscle depletions, likely related to advanced age.   Medications reviewed and include colace and senokot.   Labs reviewed: Na: 133.    NUTRITION - FOCUSED PHYSICAL EXAM:  Flowsheet Row Most Recent Value  Orbital Region Moderate depletion  Upper Arm Region Mild depletion  Thoracic and Lumbar Region No depletion  Buccal Region No depletion  Temple Region  Mild depletion  Clavicle Bone Region Mild depletion  Clavicle and Acromion Bone Region Mild depletion  Scapular Bone Region Mild depletion  Dorsal Hand Mild depletion  Patellar Region No depletion  Anterior Thigh Region No depletion  Posterior Calf Region No depletion  Edema (RD Assessment) Mild  Hair Reviewed  Eyes Reviewed  Mouth Reviewed  Skin Reviewed  Nails Reviewed       Diet Order:   Diet Order             Diet regular Room service appropriate? Yes; Fluid consistency: Thin  Diet effective now                   EDUCATION NEEDS:   No education needs have been identified at this time  Skin:  Skin Assessment: Skin Integrity Issues: Skin Integrity Issues:: Incisions Incisions: closed lt hip  Last BM:  12/21/21  Height:   Ht Readings from Last 1 Encounters:  12/22/21 5\' 3"  (1.6 m)    Weight:   Wt Readings from Last 1 Encounters:  12/22/21 59 kg    Ideal Body Weight:  52.3 kg  BMI:  Body mass index is 23.04 kg/m.  Estimated Nutritional Needs:   Kcal:  1550-1750  Protein:  75-90 grams  Fluid:  > 1.5 L    12/24/21, RD, LDN, CDCES Registered Dietitian II Certified Diabetes Care and Education Specialist Please refer to [facility] for RD and/or RD on-call/weekend/after hours pager

## 2021-12-24 NOTE — Progress Notes (Signed)
Subjective:  POD #1 s/p intramedullary fixation for left intertrochanteric hip fracture.   Patient reports left hip pain as mild to moderate.  Patient was able to get to the side of the bed this morning with physical therapy.  She states she felt mild nausea but did not vomit.  She has no other complaints today.  Her daughter is at the bedside.  Objective:   VITALS:   Vitals:   12/23/21 2321 12/24/21 0312 12/24/21 0751 12/24/21 1200  BP: (!) 140/48 (!) 143/48 (!) 137/49 (!) 118/42  Pulse: 70 78 72 (!) 58  Resp: 16 16 15 16   Temp: 97.9 F (36.6 C) 97.9 F (36.6 C) 98.5 F (36.9 C) 97.9 F (36.6 C)  TempSrc:      SpO2: 96% 96% 95% 99%  Weight:      Height:        PHYSICAL EXAM: Left lower extremity Neurovascular intact Sensation intact distally Intact pulses distally Dorsiflexion/Plantar flexion intact Incision: dressing C/D/I No cellulitis present Compartment soft  LABS  Results for orders placed or performed during the hospital encounter of 12/22/21 (from the past 24 hour(s))  CBC     Status: Abnormal   Collection Time: 12/24/21  7:32 AM  Result Value Ref Range   WBC 10.0 4.0 - 10.5 K/uL   RBC 2.81 (L) 3.87 - 5.11 MIL/uL   Hemoglobin 8.7 (L) 12.0 - 15.0 g/dL   HCT 26.5 (L) 36.0 - 46.0 %   MCV 94.3 80.0 - 100.0 fL   MCH 31.0 26.0 - 34.0 pg   MCHC 32.8 30.0 - 36.0 g/dL   RDW 12.6 11.5 - 15.5 %   Platelets 163 150 - 400 K/uL   nRBC 0.0 0.0 - 0.2 %  Basic metabolic panel     Status: Abnormal   Collection Time: 12/24/21  7:32 AM  Result Value Ref Range   Sodium 133 (L) 135 - 145 mmol/L   Potassium 4.0 3.5 - 5.1 mmol/L   Chloride 105 98 - 111 mmol/L   CO2 23 22 - 32 mmol/L   Glucose, Bld 119 (H) 70 - 99 mg/dL   BUN 28 (H) 8 - 23 mg/dL   Creatinine, Ser 1.24 (H) 0.44 - 1.00 mg/dL   Calcium 8.1 (L) 8.9 - 10.3 mg/dL   GFR, Estimated 41 (L) >60 mL/min   Anion gap 5 5 - 15    DG Chest 1 View  Result Date: 12/22/2021 CLINICAL DATA:  Left hip pain.  Status post  fall. EXAM: CHEST  1 VIEW COMPARISON:  01/05/2015 FINDINGS: The heart size and mediastinal contours are within normal limits. Aortic atherosclerotic calcifications. Both lungs are clear. The visualized skeletal structures are unremarkable. IMPRESSION: No active disease. Electronically Signed   By: Kerby Moors M.D.   On: 12/22/2021 19:09   CT HEAD WO CONTRAST (5MM)  Result Date: 12/22/2021 CLINICAL DATA:  [place] backwards, head trauma EXAM: CT HEAD WITHOUT CONTRAST TECHNIQUE: Contiguous axial images were obtained from the base of the skull through the vertex without intravenous contrast. COMPARISON:  None. FINDINGS: Brain: No acute infarct or hemorrhage. Lateral ventricles and midline structures are unremarkable. No acute extra-axial fluid collections. No mass effect. Vascular: No hyperdense vessel or unexpected calcification. Skull: Normal. Negative for fracture or focal lesion. Sinuses/Orbits: No acute finding. Other: None. IMPRESSION: 1. No acute intracranial process. Electronically Signed   By: Randa Ngo M.D.   On: 12/22/2021 20:13   CT Cervical Spine Wo Contrast  Result Date: 12/22/2021 CLINICAL  DATA:  Fell backwards, neck trauma EXAM: CT CERVICAL SPINE WITHOUT CONTRAST TECHNIQUE: Multidetector CT imaging of the cervical spine was performed without intravenous contrast. Multiplanar CT image reconstructions were also generated. COMPARISON:  None. FINDINGS: Alignment: Minimal anterolisthesis of C3 relative to C4 likely due to degenerative change. Otherwise alignment is anatomic. Skull base and vertebrae: No acute fracture. No primary bone lesion or focal pathologic process. Soft tissues and spinal canal: No prevertebral fluid or swelling. No visible canal hematoma. Disc levels: Multilevel cervical spondylosis greatest at C4-5, C5-6, and C6-7. Multilevel facet hypertrophy greatest at C3-4. Bony fusion across the C4-5 facet joints on the left. Upper chest: Central airway is patent.  Lung apices are  clear. Other: Reconstructed images demonstrate no additional findings. IMPRESSION: 1. No acute cervical spine fracture. 2. Multilevel degenerative changes as above. Electronically Signed   By: Randa Ngo M.D.   On: 12/22/2021 20:11   DG C-Arm 1-60 Min-No Report  Result Date: 12/23/2021 CLINICAL DATA:  Surgical internal fixation of left hip fracture. EXAM: DG HIP (WITH OR WITHOUT PELVIS) 2-3V LEFT; DG C-ARM 1-60 MIN-NO REPORT Radiation exposure index: 14.815 mGy. COMPARISON:  December 22, 2021. FINDINGS: Four intraoperative fluoroscopic images were obtained of the left femur. These images demonstrate surgical internal fixation of proximal left femoral intertrochanteric fracture with intramedullary rod fixation of the left femoral shaft. IMPRESSION: Fluoroscopic guidance provided during surgical internal fixation of proximal left femoral fracture Electronically Signed   By: Marijo Conception M.D.   On: 12/23/2021 12:59   DG HIP UNILAT WITH PELVIS 2-3 VIEWS LEFT  Result Date: 12/23/2021 CLINICAL DATA:  Surgical internal fixation of left hip fracture. EXAM: DG HIP (WITH OR WITHOUT PELVIS) 2-3V LEFT; DG C-ARM 1-60 MIN-NO REPORT Radiation exposure index: 14.815 mGy. COMPARISON:  December 22, 2021. FINDINGS: Four intraoperative fluoroscopic images were obtained of the left femur. These images demonstrate surgical internal fixation of proximal left femoral intertrochanteric fracture with intramedullary rod fixation of the left femoral shaft. IMPRESSION: Fluoroscopic guidance provided during surgical internal fixation of proximal left femoral fracture Electronically Signed   By: Marijo Conception M.D.   On: 12/23/2021 12:59   DG Hip Unilat W or Wo Pelvis 2-3 Views Left  Result Date: 12/22/2021 CLINICAL DATA:  Status post fall EXAM: DG HIP (WITH OR WITHOUT PELVIS) 2-3V LEFT COMPARISON:  None. FINDINGS: Bones are diffusely osteopenic. There is an acute, comminuted intertrochanteric fracture involving the  proximal left femur. Mild impaction of the fracture fragments identified. Large stool burden is incidentally noted within the colon and rectum. IMPRESSION: Acute, comminuted intertrochanteric fracture of the proximal left femur. Electronically Signed   By: Kerby Moors M.D.   On: 12/22/2021 19:08   DG FEMUR PORT MIN 2 VIEWS LEFT  Result Date: 12/23/2021 CLINICAL DATA:  Status post IM nail placement EXAM: LEFT FEMUR PORTABLE 2 VIEWS COMPARISON:  None. FINDINGS: Left intertrochanteric fracture transfixed with a intramedullary nail and interlocking femoral neck screw. No hardware failure or complication. Near anatomic alignment. Postsurgical changes in the soft tissues overlying the left hip. 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 IMPRESSION: Interval ORIF of a left intertrochanteric fracture. Electronically Signed   By: Kathreen Devoid M.D.   On: 12/23/2021 14:44    Assessment/Plan: 1 Day Post-Op   Principal Problem:   Femur fracture, left (HCC) Active Problems:   Essential hypertension   GERD (gastroesophageal reflux disease)   Acquired hypothyroidism  Patient is stable postop.  Her hemoglobin hematocrit are stable.  Her Foley catheter has been removed.  Patient will begin Lovenox for DVT prophylaxis.  Patient is weightbearing as tolerated left lower extremity.  She will continue physical occupational therapy as her pain allows.  Patient will likely need a skilled nursing facility upon discharge.    Juanell Fairly , MD 12/24/2021, 12:35 PM

## 2021-12-25 LAB — CBC
HCT: 23.3 % — ABNORMAL LOW (ref 36.0–46.0)
Hemoglobin: 7.7 g/dL — ABNORMAL LOW (ref 12.0–15.0)
MCH: 30.6 pg (ref 26.0–34.0)
MCHC: 33 g/dL (ref 30.0–36.0)
MCV: 92.5 fL (ref 80.0–100.0)
Platelets: 150 10*3/uL (ref 150–400)
RBC: 2.52 MIL/uL — ABNORMAL LOW (ref 3.87–5.11)
RDW: 12.9 % (ref 11.5–15.5)
WBC: 9.1 10*3/uL (ref 4.0–10.5)
nRBC: 0 % (ref 0.0–0.2)

## 2021-12-25 LAB — BASIC METABOLIC PANEL
Anion gap: 9 (ref 5–15)
BUN: 35 mg/dL — ABNORMAL HIGH (ref 8–23)
CO2: 22 mmol/L (ref 22–32)
Calcium: 8.5 mg/dL — ABNORMAL LOW (ref 8.9–10.3)
Chloride: 103 mmol/L (ref 98–111)
Creatinine, Ser: 1.41 mg/dL — ABNORMAL HIGH (ref 0.44–1.00)
GFR, Estimated: 35 mL/min — ABNORMAL LOW (ref >60)
Glucose, Bld: 100 mg/dL — ABNORMAL HIGH (ref 70–99)
Potassium: 3.9 mmol/L (ref 3.5–5.1)
Sodium: 134 mmol/L — ABNORMAL LOW (ref 135–145)

## 2021-12-25 NOTE — Progress Notes (Signed)
°  Subjective:  POD #2 s/p intramedullary fixation for left intertrochanteric hip fracture.   Patient reports that pain is tolerable. She was able to walk 6 feet with PT.  Objective:   VITALS:   Vitals:   12/25/21 0323 12/25/21 0358 12/25/21 0743 12/25/21 1137  BP: (!) 146/48 (!) 146/44 (!) 154/49 (!) 153/50  Pulse: 77 80 79 76  Resp: 15 14 15 16   Temp: 98.8 F (37.1 C) 97.8 F (36.6 C) 98.7 F (37.1 C) 99.3 F (37.4 C)  TempSrc:      SpO2: 96% 92% 97% 95%  Weight:      Height:        PHYSICAL EXAM: Left lower extremity Neurovascular intact Sensation intact distally Intact pulses distally Dorsiflexion/Plantar flexion intact Incision: dressing C/D/I No cellulitis present Compartment soft  LABS  Results for orders placed or performed during the hospital encounter of 12/22/21 (from the past 24 hour(s))  CBC     Status: Abnormal   Collection Time: 12/25/21  5:49 AM  Result Value Ref Range   WBC 9.1 4.0 - 10.5 K/uL   RBC 2.52 (L) 3.87 - 5.11 MIL/uL   Hemoglobin 7.7 (L) 12.0 - 15.0 g/dL   HCT 12/27/21 (L) 04.5 - 40.9 %   MCV 92.5 80.0 - 100.0 fL   MCH 30.6 26.0 - 34.0 pg   MCHC 33.0 30.0 - 36.0 g/dL   RDW 81.1 91.4 - 78.2 %   Platelets 150 150 - 400 K/uL   nRBC 0.0 0.0 - 0.2 %  Basic metabolic panel     Status: Abnormal   Collection Time: 12/25/21  5:49 AM  Result Value Ref Range   Sodium 134 (L) 135 - 145 mmol/L   Potassium 3.9 3.5 - 5.1 mmol/L   Chloride 103 98 - 111 mmol/L   CO2 22 22 - 32 mmol/L   Glucose, Bld 100 (H) 70 - 99 mg/dL   BUN 35 (H) 8 - 23 mg/dL   Creatinine, Ser 12/27/21 (H) 0.44 - 1.00 mg/dL   Calcium 8.5 (L) 8.9 - 10.3 mg/dL   GFR, Estimated 35 (L) >60 mL/min   Anion gap 9 5 - 15    No results found.  Assessment/Plan: 2 Days Post-Op   Principal Problem:   Femur fracture, left (HCC) Active Problems:   Essential hypertension   GERD (gastroesophageal reflux disease)   Acquired hypothyroidism   - Continue PT/OT- WBAT LLE - Continue Lovenox  for DVT prophylaxis - Discharge pending case management and skilled nursing facility availability - Follow up with Dr. 2.13 in 2 weeks    Martha Clan , MD 12/25/2021, 3:44 PM

## 2021-12-25 NOTE — Progress Notes (Signed)
PT Cancellation Note  Patient Details Name: Daisy Mora MRN: [id number] DOB: 07-Nov-1926   Cancelled Treatment:    Reason Eval/Treat Not Completed: Fatigue/lethargy limiting ability to participate. Pt supine in bed upon entry with daughters and NT at bedside. Pt politely declining participation this afternoon with PT as she is fatigued and just got back into bed, after being in the recliner since AM PT session (@ [phone]). Agreeable for PT to come tomorrow. Will plan for BID sessions tomorrow.    Vira Blanco, PT, DPT 2:37 PM,12/25/21

## 2021-12-25 NOTE — Progress Notes (Signed)
Physical Therapy Treatment Patient Details Name: Daisy Mora MRN: [id number] DOB: 04-11-1926 Today's Date: 12/25/2021   History of Present Illness Tecia Cinnamon is a 89yoF who comes to [facility] on 12/22/21 after fall, imaging revealing of Lt femur fracture. Pt went to theater with Dr. Scherry Ran on 12/29 for Left femur ORIF c IM hardware. Pt was carryng laundry from kitchen to next room when she tripped over a single step threshold. At baseline pt live3s alone, fully independent, no previous falls, uses a SPC when AMB outside of home.    PT Comments    Pt tolerated treatment well today, able to improve overall endurance and ambulation distance since last session. Pt required min assist for bed mobility, mod assist for transfers, and mod assist for limited gait at bedside. Due to LLE pain and weakness, pt required multimodal cues for safety and sequencing with gait in RW. Able to stand with supervision while performing pericare after toileting. HEP deferred this AM due to fatigue and increased pain levels; plan to address this PM. Despite progress, pt continues to be limited with meeting goals due to decreased activity tolerance, weakness, increased pain levels, and decreased standing balance. Pt will continue to benefit from skilled acute PT services to address deficits for return to baseline function. Will continue to recommend SNF at DC.    Recommendations for follow up therapy are one component of a multi-disciplinary discharge planning process, led by the attending physician.  Recommendations may be updated based on patient status, additional functional criteria and insurance authorization.  Follow Up Recommendations  Skilled nursing-short term rehab (<3 hours/day)     Assistance Recommended at Discharge Intermittent Supervision/Assistance  Equipment Recommendations  Rolling walker (2 wheels)       Precautions / Restrictions Precautions Precautions: Fall Restrictions Weight Bearing  Restrictions: Yes LLE Weight Bearing: Weight bearing as tolerated     Mobility  Bed Mobility Overal bed mobility: Needs Assistance Bed Mobility: Supine to Sit     Supine to sit: Min assist     General bed mobility comments: assist with LLE facilitation to EOB    Transfers Overall transfer level: Needs assistance Equipment used: Rolling walker (2 wheels) Transfers: Sit to/from Stand Sit to Stand: Mod assist           General transfer comment: for power to stand from EOB and BSC with RW; cues for hand and LLE placement for pain management/safety    Ambulation/Gait Ambulation/Gait assistance: Mod assist Gait Distance (Feet): 6 Feet (36ft x1 (EOB>BSC), 25ft x1 (BSC>recliner)) Assistive device: Rolling walker (2 wheels)         General Gait Details: Required increased assist for weight shift and BLE facilitation for stepping; decreased balance noted with R step/L stance. Multimodal cues for safety/sequencing.      Balance Overall balance assessment: Needs assistance Sitting-balance support: Bilateral upper extremity supported;Feet supported Sitting balance-Leahy Scale: Fair Sitting balance - Comments: no LOB EOB   Standing balance support: Bilateral upper extremity supported;During functional activity Standing balance-Leahy Scale: Fair Standing balance comment: able to maintain static standing during pericare without LOB; poor dynamic balance during gait                            Cognition Arousal/Alertness: Awake/alert Behavior During Therapy: WFL for tasks assessed/performed Overall Cognitive Status: Within Functional Limits for tasks assessed  Exercises Other Exercises Other Exercises: Participated in bed mobility, transfers, and minimal gait. Required increased assist for sequencing with gait/transfers. Other Exercises: Pt educated re: PT role/POC, DC recommendations, WB precautions,  safety/sequencing with gait. She verbalized understanding.        Pertinent Vitals/Pain Pain Assessment: Faces Faces Pain Scale: Hurts even more Pain Location: Left knee Pain Descriptors / Indicators: Aching;Discomfort Pain Intervention(s): Monitored during session;Repositioned     PT Goals (current goals can now be found in the care plan section) Acute Rehab PT Goals Patient Stated Goal: return to living independently at home PT Goal Formulation: With patient Time For Goal Achievement: 01/07/22 Potential to Achieve Goals: Fair Progress towards PT goals: Progressing toward goals    Frequency    BID      PT Plan Current plan remains appropriate       AM-PAC PT "6 Clicks" Mobility   Outcome Measure  Help needed turning from your back to your side while in a flat bed without using bedrails?: A Little Help needed moving from lying on your back to sitting on the side of a flat bed without using bedrails?: A Little Help needed moving to and from a bed to a chair (including a wheelchair)?: A Lot Help needed standing up from a chair using your arms (e.g., wheelchair or bedside chair)?: A Lot Help needed to walk in hospital room?: A Lot Help needed climbing 3-5 steps with a railing? : Total 6 Click Score: 13    End of Session Equipment Utilized During Treatment: Gait belt Activity Tolerance: Patient tolerated treatment well;Patient limited by fatigue;Patient limited by pain Patient left: with call bell/phone within reach;in chair;with chair alarm set Nurse Communication: Mobility status PT Visit Diagnosis: Difficulty in walking, not elsewhere classified (R26.2);Other abnormalities of gait and mobility (R26.89)     Time: 5361-4431 PT Time Calculation (min) (ACUTE ONLY): 23 min  Charges:  $Gait Training: 8-22 mins $Therapeutic Activity: 8-22 mins                     Vira Blanco, PT, DPT 9:59 AM,12/25/21

## 2021-12-25 NOTE — Progress Notes (Signed)
PROGRESS NOTE    Daisy Mora  YIF:[id number] DOB: [birth year] DOA: 12/22/2021 PCP: System, Provider Not In   Brief Narrative:  Daisy Mora is a 87 y.o. female with medical history significant for hypertension, GERD, who presents emergency department for chief concerns of a fall. Noted a mechanical fall where she tripped, lost her balance, with laundry in her arms and unable to catch herself. She reports that when she fell she did not hit her head, loss of consciousness, or pass out.  She states that the pain was not immediate.  She states that when she attempts to ambulate or get up and or bear weight, the pain was a 10 out of 10. Presented to the ED where imaging confirmed Left femur fracture - ortho called for consult   Assessment & Plan:  Acute comminuted intertrochanteric fracture of the left proximal femur -Secondary to mechanical fall  -Orthopedics following, status post ORIF 12/29 - tolerated well -Discontinue p.o. narcotics given nausea overnight, transition to tramadol as needed with breakthrough IV narcotics only as necessary -PT OT to follow, likely disposition SNF given patient's age and fracture type   Essential hypertension, uncontrolled  -Initially uncontrolled in the setting of pain, continue Coreg hydralazine amlodipine Home meds  -Continue as needed labetalol    Hypothyroidism, stable -continue levothyroxine 25 mcg daily GERD-PPI resumed   DVT prophylaxis: Per orthopedics Code Status: DNR Family Communication: At bedside  Status is: Inpatient  Dispo: The patient is from: Home              Anticipated d/c is to: SNF              Anticipated d/c date is: 48 to 72 hours              Patient currently not medically stable for discharge  Consultants:  Orthopedic surgery  Procedures:  ORIF left femur 12/23/2021  Antimicrobials:  None indicated  Subjective: No acute issues or events overnight denies nausea vomiting diarrhea constipation headache  fevers chills or chest pain  Objective: Vitals:   12/24/21 1958 12/24/21 2320 12/25/21 0323 12/25/21 0358  BP: (!) 134/47 (!) 144/43 (!) 146/48 (!) 146/44  Pulse: 64 76 77 80  Resp: 15 15 15 14   Temp: 98 F (36.7 C) 100.1 F (37.8 C) 98.8 F (37.1 C) 97.8 F (36.6 C)  TempSrc:      SpO2: 95% 94% 96% 92%  Weight:      Height:        Intake/Output Summary (Last 24 hours) at 12/25/2021 0735 Last data filed at 12/24/2021 2300 Gross per 24 hour  Intake 480 ml  Output 350 ml  Net 130 ml    Filed Weights   12/22/21 1745  Weight: 59 kg    Examination:  General:  Pleasantly resting in bed, No acute distress. HEENT:  Normocephalic atraumatic.  Sclerae nonicteric, noninjected.  Extraocular movements intact bilaterally. Neck:  Without mass or deformity.  Trachea is midline. Lungs:  Clear to auscultate bilaterally without rhonchi, wheeze, or rales. Heart:  Regular rate and rhythm.  Without murmurs, rubs, or gallops. Abdomen:  Soft, nontender, nondistended.  Without guarding or rebound. Extremities: Without cyanosis, clubbing, edema Vascular:  Dorsalis pedis and posterior tibial pulses palpable bilaterally. Skin:  Warm and dry, no erythema, no ulcerations, left hip skin bandage clean/dry/intact   Data Reviewed: I have personally reviewed following labs and imaging studies  CBC: Recent Labs  Lab 12/22/21 1945 12/23/21 0615 12/24/21 0732 12/25/21 12/27/21  WBC 9.7 6.5 10.0 9.1  NEUTROABS 7.7  --   --   --   HGB 11.1* 10.0* 8.7* 7.7*  HCT 33.8* 29.9* 26.5* 23.3*  MCV 94.9 94.0 94.3 92.5  PLT 204 177 163 150    Basic Metabolic Panel: Recent Labs  Lab 12/22/21 1945 12/23/21 0615 12/24/21 0732 12/25/21 0549  NA 135 135 133* 134*  K 4.0 3.8 4.0 3.9  CL 108 109 105 103  CO2 22 23 23 22   GLUCOSE 120* 101* 119* 100*  BUN 30* 27* 28* 35*  CREATININE 1.27* 1.15* 1.24* 1.41*  CALCIUM 8.7* 8.3* 8.1* 8.5*    GFR: Estimated Creatinine Clearance: 20.6 mL/min (A) (by C-G  formula based on SCr of 1.41 mg/dL (H)). Liver Function Tests: Recent Labs  Lab 12/22/21 1945  AST 22  ALT 16  ALKPHOS 57  BILITOT 0.8  PROT 6.6  ALBUMIN 3.7    No results for input(s): LIPASE, AMYLASE in the last 168 hours. No results for input(s): AMMONIA in the last 168 hours. Coagulation Profile: No results for input(s): INR, PROTIME in the last 168 hours. Cardiac Enzymes: No results for input(s): CKTOTAL, CKMB, CKMBINDEX, TROPONINI in the last 168 hours. BNP (last 3 results) No results for input(s): PROBNP in the last 8760 hours. HbA1C: No results for input(s): HGBA1C in the last 72 hours. CBG: No results for input(s): GLUCAP in the last 168 hours. Lipid Profile: No results for input(s): CHOL, HDL, LDLCALC, TRIG, CHOLHDL, LDLDIRECT in the last 72 hours. Thyroid Function Tests: No results for input(s): TSH, T4TOTAL, FREET4, T3FREE, THYROIDAB in the last 72 hours. Anemia Panel: No results for input(s): VITAMINB12, FOLATE, FERRITIN, TIBC, IRON, RETICCTPCT in the last 72 hours. Sepsis Labs: No results for input(s): PROCALCITON, LATICACIDVEN in the last 168 hours.  Recent Results (from the past 240 hour(s))  Resp Panel by RT-PCR (Flu A&B, Covid) Nasopharyngeal Swab     Status: None   Collection Time: 12/22/21  7:45 PM   Specimen: Nasopharyngeal Swab; Nasopharyngeal(NP) swabs in vial transport medium  Result Value Ref Range Status   SARS Coronavirus 2 by RT PCR NEGATIVE NEGATIVE Final    Comment: (NOTE) SARS-CoV-2 target nucleic acids are NOT DETECTED.  The SARS-CoV-2 RNA is generally detectable in upper respiratory specimens during the acute phase of infection. The lowest concentration of SARS-CoV-2 viral copies this assay can detect is 138 copies/mL. A negative result does not preclude SARS-Cov-2 infection and should not be used as the sole basis for treatment or other patient management decisions. A negative result may occur with  improper specimen  collection/handling, submission of specimen other than nasopharyngeal swab, presence of viral mutation(s) within the areas targeted by this assay, and inadequate number of viral copies(<138 copies/mL). A negative result must be combined with clinical observations, patient history, and epidemiological information. The expected result is Negative.  Fact Sheet for Patients:  12/24/21  Fact Sheet for Healthcare Providers:  BloggerCourse.com  This test is no t yet approved or cleared by the [place] FDA and  has been authorized for detection and/or diagnosis of SARS-CoV-2 by FDA under an Emergency Use Authorization (EUA). This EUA will remain  in effect (meaning this test can be used) for the duration of the COVID-19 declaration under Section 564(b)(1) of the Act, 21 U.S.C.section 360bbb-3(b)(1), unless the authorization is terminated  or revoked sooner.       Influenza A by PCR NEGATIVE NEGATIVE Final   Influenza B by PCR NEGATIVE NEGATIVE Final  Comment: (NOTE) The Xpert Xpress SARS-CoV-2/FLU/RSV plus assay is intended as an aid in the diagnosis of influenza from Nasopharyngeal swab specimens and should not be used as a sole basis for treatment. Nasal washings and aspirates are unacceptable for Xpert Xpress SARS-CoV-2/FLU/RSV testing.  Fact Sheet for Patients: BloggerCourse.com  Fact Sheet for Healthcare Providers: SeriousBroker.it  This test is not yet approved or cleared by the [place] FDA and has been authorized for detection and/or diagnosis of SARS-CoV-2 by FDA under an Emergency Use Authorization (EUA). This EUA will remain in effect (meaning this test can be used) for the duration of the COVID-19 declaration under Section 564(b)(1) of the Act, 21 U.S.C. section 360bbb-3(b)(1), unless the authorization is terminated or revoked.  Performed at [facility], [address], [place], [place] [address]       Radiology Studies: DG C-Arm 1-60 Min-No Report  Result Date: 12/23/2021 CLINICAL DATA:  Surgical internal fixation of left hip fracture. EXAM: DG HIP (WITH OR WITHOUT PELVIS) 2-3V LEFT; DG C-ARM 1-60 MIN-NO REPORT Radiation exposure index: 14.815 mGy. COMPARISON:  December 22, 2021. FINDINGS: Four intraoperative fluoroscopic images were obtained of the left femur. These images demonstrate surgical internal fixation of proximal left femoral intertrochanteric fracture with intramedullary rod fixation of the left femoral shaft. IMPRESSION: Fluoroscopic guidance provided during surgical internal fixation of proximal left femoral fracture Electronically Signed   By: Lupita Raider M.D.   On: 12/23/2021 12:59   DG HIP UNILAT WITH PELVIS 2-3 VIEWS LEFT  Result Date: 12/23/2021 CLINICAL DATA:  Surgical internal fixation of left hip fracture. EXAM: DG HIP (WITH OR WITHOUT PELVIS) 2-3V LEFT; DG C-ARM 1-60 MIN-NO REPORT Radiation exposure index: 14.815 mGy. COMPARISON:  December 22, 2021. FINDINGS: Four intraoperative fluoroscopic images were obtained of the left femur. These images demonstrate surgical internal fixation of proximal left femoral intertrochanteric fracture with intramedullary rod fixation of the left femoral shaft. IMPRESSION: Fluoroscopic guidance provided during surgical internal fixation of proximal left femoral fracture Electronically Signed   By: Lupita Raider M.D.   On: 12/23/2021 12:59   DG FEMUR PORT MIN 2 VIEWS LEFT  Result Date: 12/23/2021 CLINICAL DATA:  Status post IM nail placement EXAM: LEFT FEMUR PORTABLE 2 VIEWS COMPARISON:  None. FINDINGS: Left intertrochanteric fracture transfixed with a intramedullary nail and interlocking femoral neck screw. No hardware failure or complication. Near anatomic alignment. Postsurgical changes in the soft tissues overlying the left hip. 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 IMPRESSION: Interval ORIF of a left intertrochanteric fracture. Electronically Signed   By: Elige Ko M.D.   On: 12/23/2021 14:44        Scheduled Meds:  amLODipine  10 mg Oral Daily   carvedilol  25 mg Oral BID WC   Chlorhexidine Gluconate Cloth  6 each Topical Daily   docusate sodium  100 mg Oral BID   enoxaparin (LOVENOX) injection  30 mg Subcutaneous Q24H   famotidine  20 mg Oral Daily   feeding supplement  237 mL Oral BID BM   hydrALAZINE  25 mg Oral TID   levothyroxine  25 mcg Oral Q0600   lidocaine  1 patch Transdermal Q24H   multivitamin with minerals  1 tablet Oral Daily   pantoprazole  20 mg Oral Daily   senna  1 tablet Oral BID   Continuous Infusions:  methocarbamol (ROBAXIN) IV       LOS: 3 days   Time spent:  Azucena Fallen,  DO [facility]  If 7PM-7AM, please contact night-coverage [email or web address]  12/25/2021, 7:35 AM

## 2021-12-26 DIAGNOSIS — E039 Hypothyroidism, unspecified: Secondary | ICD-10-CM

## 2021-12-26 LAB — CBC
HCT: 21.4 % — ABNORMAL LOW (ref 36.0–46.0)
Hemoglobin: 7.1 g/dL — ABNORMAL LOW (ref 12.0–15.0)
MCH: 30.7 pg (ref 26.0–34.0)
MCHC: 33.2 g/dL (ref 30.0–36.0)
MCV: 92.6 fL (ref 80.0–100.0)
Platelets: 164 10*3/uL (ref 150–400)
RBC: 2.31 MIL/uL — ABNORMAL LOW (ref 3.87–5.11)
RDW: 13.2 % (ref 11.5–15.5)
WBC: 7.8 10*3/uL (ref 4.0–10.5)
nRBC: 0 % (ref 0.0–0.2)

## 2021-12-26 LAB — BASIC METABOLIC PANEL
Anion gap: 9 (ref 5–15)
BUN: 51 mg/dL — ABNORMAL HIGH (ref 8–23)
CO2: 22 mmol/L (ref 22–32)
Calcium: 8.7 mg/dL — ABNORMAL LOW (ref 8.9–10.3)
Chloride: 103 mmol/L (ref 98–111)
Creatinine, Ser: 1.59 mg/dL — ABNORMAL HIGH (ref 0.44–1.00)
GFR, Estimated: 30 mL/min — ABNORMAL LOW (ref >60)
Glucose, Bld: 107 mg/dL — ABNORMAL HIGH (ref 70–99)
Potassium: 3.9 mmol/L (ref 3.5–5.1)
Sodium: 134 mmol/L — ABNORMAL LOW (ref 135–145)

## 2021-12-26 NOTE — Progress Notes (Signed)
Physical Therapy Treatment Patient Details Name: Daisy Mora MRN: [id number] DOB: 02-15-1926 Today's Date: 12/26/2021   History of Present Illness Daisy Mora is a 48yoF who comes to [facility] on 12/22/21 after fall, imaging revealing of Lt femur fracture. Pt went to theater with Dr. Wynetta Emery on 12/29 for Left femur ORIF c IM hardware. Pt was carryng laundry from kitchen to next room when she tripped over a single step threshold. At baseline pt live3s alone, fully independent, no previous falls, uses a SPC when AMB outside of home.    PT Comments    Pt received for morning PT session with no pain currently in L hip. Educated and instructed in LE therex with Hep packet provided. Does have some pain with LLE mobility. Requiring minA to transfer to EOB with use of bed rail denying dizziness. Bed elevated and pt requiring VC's for safe hand placement to stand to RW with minA. Pt displaying poor use of Rw requiring minA and mulitmodal cuing for safe use/sequencing of RW with ambulating 4' at EOB. Due to pain, pt unable to flex at hip to clear LLE relying on scooting L foot forward to progress LLE with heavy UE reliance on RW throughout. Education and cuing provided for keeping RW close for safety and improved ability to utilize UE's to offload LLE with poor carryover despite education. Pt endorsing dizziness so recliner brought to pt to sit. Dizziness improves with seated rest. SPO2 at 95%, HR is WNL. Deferred further mobility and RN aware of dizziness. Pt did not have any orthostatic symptoms with transitional movements in gravity dependent positions. PT did not downward trending hemoglobin levels whivh could be playing a role in pt symptoms today. Continue to rec STR upon D/c due to deficits in strength/mobility and poor safety/use of AD which is increasing pt's risk of falls.     Recommendations for follow up therapy are one component of a multi-disciplinary discharge planning process, led by the  attending physician.  Recommendations may be updated based on patient status, additional functional criteria and insurance authorization.  Follow Up Recommendations  Skilled nursing-short term rehab (<3 hours/day)     Assistance Recommended at Discharge Intermittent Supervision/Assistance  Equipment Recommendations  Rolling walker (2 wheels)    Recommendations for Other Services       Precautions / Restrictions Precautions Precautions: ICD/Pacemaker Restrictions Weight Bearing Restrictions: Yes LLE Weight Bearing: Weight bearing as tolerated     Mobility  Bed Mobility Overal bed mobility: Needs Assistance Bed Mobility: Supine to Sit     Supine to sit: Min guard;HOB elevated       Patient Response: Cooperative  Transfers Overall transfer level: Needs assistance Equipment used: Rolling walker (2 wheels) Transfers: Sit to/from Stand Sit to Stand: Min assist;From elevated surface           General transfer comment: minA to stand with cues for hand placement and bed elevated.    Ambulation/Gait Ambulation/Gait assistance: Min assist Gait Distance (Feet): 4 Feet Assistive device: Rolling walker (2 wheels) Gait Pattern/deviations: Step-to pattern;Decreased step length - right;Decreased stance time - left;Decreased stride length;Decreased weight shift to left;Antalgic       General Gait Details: Requires increased time, unable to clear LLE in swing phase and unable to fully weightbear on LLE cuaing decreased stance time on L. Does have dizziness with ambulation today so further mobility deferred. Requires minA at [facility] for sequencing and safety.   Stairs  Wheelchair Mobility    Modified Rankin (Stroke Patients Only)       Balance Overall balance assessment: Needs assistance Sitting-balance support: Bilateral upper extremity supported;Feet supported Sitting balance-Leahy Scale: Fair     Standing balance support: Bilateral upper extremity  supported;During functional activity Standing balance-Leahy Scale: Fair Standing balance comment: requires BUE support on RW.                            Cognition Arousal/Alertness: Awake/alert Behavior During Therapy: WFL for tasks assessed/performed Overall Cognitive Status: Within Functional Limits for tasks assessed                                          Exercises General Exercises - Lower Extremity Ankle Circles/Pumps: AROM;Supine;Both;15 reps Quad Sets: Left;AROM;Supine;15 reps Gluteal Sets: AROM;Supine;Both;10 reps Heel Slides: AAROM;Strengthening;Left;10 reps;Supine Hip ABduction/ADduction: AROM;Supine;Both;15 reps Other Exercises Other Exercises: Education for safe RW sequencing and safe use of RW with transfers. Educated on LE therex, sets, reps, frequency, given HEP packet.    General Comments General comments (skin integrity, edema, etc.): SPo2: 95% post ambulation      Pertinent Vitals/Pain Pain Assessment: 0-10 Pain Score: 0-No pain Pain Location: No pain at rest, does wince and grimace with mobility. Pain Intervention(s): Limited activity within patient's tolerance;Monitored during session;Repositioned;Premedicated before session    Home Living                          Prior Function            PT Goals (current goals can now be found in the care plan section) Acute Rehab PT Goals Patient Stated Goal: return to living independently at home PT Goal Formulation: With patient Time For Goal Achievement: 01/07/22 Potential to Achieve Goals: Fair Progress towards PT goals: Progressing toward goals    Frequency    BID      PT Plan Current plan remains appropriate    Co-evaluation              AM-PAC PT "6 Clicks" Mobility   Outcome Measure  Help needed turning from your back to your side while in a flat bed without using bedrails?: A Little Help needed moving from lying on your back to sitting on the  side of a flat bed without using bedrails?: A Little Help needed moving to and from a bed to a chair (including a wheelchair)?: A Lot Help needed standing up from a chair using your arms (e.g., wheelchair or bedside chair)?: A Lot Help needed to walk in hospital room?: A Lot Help needed climbing 3-5 steps with a railing? : Total 6 Click Score: 13    End of Session Equipment Utilized During Treatment: Gait belt Activity Tolerance: Patient tolerated treatment well;Treatment limited secondary to medical complications (Comment) (dizziness) Patient left: with call bell/phone within reach;in chair;with chair alarm set Nurse Communication: Mobility status PT Visit Diagnosis: Difficulty in walking, not elsewhere classified (R26.2);Other abnormalities of gait and mobility (R26.89)     Time: [id number] PT Time Calculation (min) (ACUTE ONLY): 23 min  Charges:  $Gait Training: 8-22 mins $Therapeutic Exercise: 8-22 mins                     Sopheap Basic M. Fairly IV, PT, DPT Physical Therapist- [facility]  12/26/2021, 10:54 AM

## 2021-12-26 NOTE — Progress Notes (Signed)
PROGRESS NOTE    Daisy Mora  RUE:[id number] DOB: 04/23/1926 DOA: 12/22/2021 PCP: System, Provider Not In   Brief Narrative:  Daisy Mora is a 88 y.o. female with medical history significant for hypertension, GERD, who presents emergency department for chief concerns of a fall. Noted a mechanical fall where she tripped, lost her balance, with laundry in her arms and unable to catch herself. She reports that when she fell she did not hit her head, loss of consciousness, or pass out.  She states that the pain was not immediate.  She states that when she attempts to ambulate or get up and or bear weight, the pain was a 10 out of 10. Presented to the ED where imaging confirmed Left femur fracture - ortho called for consult   Assessment & Plan:  Acute comminuted intertrochanteric fracture of the left proximal femur -Secondary to mechanical fall  -Orthopedics following, status post ORIF 12/29 - tolerated well -Discontinue p.o. narcotics given nausea overnight, transition to tramadol as needed with breakthrough IV narcotics only as necessary -PT OT to follow, likely disposition SNF given patient's age and fracture type   Essential hypertension, uncontrolled  -Initially uncontrolled in the setting of pain, continue Coreg hydralazine amlodipine Home meds  -Continue as needed labetalol    Hypothyroidism, stable -continue levothyroxine 25 mcg daily GERD-PPI resumed   DVT prophylaxis: Per orthopedics Code Status: DNR Family Communication: At bedside  Status is: Inpatient  Dispo: The patient is from: Home              Anticipated d/c is to: SNF              Anticipated d/c date is: 24 hours              Patient currently not medically stable for discharge  Consultants:  Orthopedic surgery  Procedures:  ORIF left femur 12/23/2021  Antimicrobials:  None indicated  Subjective: No acute issues or events overnight denies nausea vomiting diarrhea constipation headache fevers  chills or chest pain.  Left knee pain ongoing moderately well controlled; left hip pain well controlled.  Objective: Vitals:   12/25/21 1137 12/25/21 1616 12/26/21 0311 12/26/21 0746  BP: (!) 153/50 (!) 130/46 (!) 134/41 (!) 126/44  Pulse: 76 74 74 64  Resp: 16 18 16 18   Temp: 99.3 F (37.4 C) 99.8 F (37.7 C) 99.2 F (37.3 C) 98.4 F (36.9 C)  TempSrc:      SpO2: 95% 95% 94% 94%  Weight:      Height:        Intake/Output Summary (Last 24 hours) at 12/26/2021 0757 Last data filed at 12/25/2021 1048 Gross per 24 hour  Intake 120 ml  Output --  Net 120 ml    Filed Weights   12/22/21 1745  Weight: 59 kg    Examination:  General:  Pleasantly resting in bed, No acute distress. HEENT:  Normocephalic atraumatic.  Sclerae nonicteric, noninjected.  Extraocular movements intact bilaterally. Neck:  Without mass or deformity.  Trachea is midline. Lungs:  Clear to auscultate bilaterally without rhonchi, wheeze, or rales. Heart:  Regular rate and rhythm.  Without murmurs, rubs, or gallops. Abdomen:  Soft, nontender, nondistended.  Without guarding or rebound. Extremities: Without cyanosis, clubbing, edema Vascular:  Dorsalis pedis and posterior tibial pulses palpable bilaterally. Skin:  Warm and dry, no erythema, no ulcerations, left hip skin bandage clean/dry/intact   Data Reviewed: I have personally reviewed following labs and imaging studies  CBC: Recent Labs  Lab 12/22/21 1945 12/23/21 0615 12/24/21 0732 12/25/21 0549 12/26/21 0643  WBC 9.7 6.5 10.0 9.1 7.8  NEUTROABS 7.7  --   --   --   --   HGB 11.1* 10.0* 8.7* 7.7* 7.1*  HCT 33.8* 29.9* 26.5* 23.3* 21.4*  MCV 94.9 94.0 94.3 92.5 92.6  PLT 204 177 163 150 164    Basic Metabolic Panel: Recent Labs  Lab 12/22/21 1945 12/23/21 0615 12/24/21 0732 12/25/21 0549 12/26/21 0643  NA 135 135 133* 134* 134*  K 4.0 3.8 4.0 3.9 3.9  CL 108 109 105 103 103  CO2 22 23 23 22 22   GLUCOSE 120* 101* 119* 100* 107*  BUN  30* 27* 28* 35* 51*  CREATININE 1.27* 1.15* 1.24* 1.41* 1.59*  CALCIUM 8.7* 8.3* 8.1* 8.5* 8.7*    GFR: Estimated Creatinine Clearance: 18.3 mL/min (A) (by C-G formula based on SCr of 1.59 mg/dL (H)). Liver Function Tests: Recent Labs  Lab 12/22/21 1945  AST 22  ALT 16  ALKPHOS 57  BILITOT 0.8  PROT 6.6  ALBUMIN 3.7    No results for input(s): LIPASE, AMYLASE in the last 168 hours. No results for input(s): AMMONIA in the last 168 hours. Coagulation Profile: No results for input(s): INR, PROTIME in the last 168 hours. Cardiac Enzymes: No results for input(s): CKTOTAL, CKMB, CKMBINDEX, TROPONINI in the last 168 hours. BNP (last 3 results) No results for input(s): PROBNP in the last 8760 hours. HbA1C: No results for input(s): HGBA1C in the last 72 hours. CBG: No results for input(s): GLUCAP in the last 168 hours. Lipid Profile: No results for input(s): CHOL, HDL, LDLCALC, TRIG, CHOLHDL, LDLDIRECT in the last 72 hours. Thyroid Function Tests: No results for input(s): TSH, T4TOTAL, FREET4, T3FREE, THYROIDAB in the last 72 hours. Anemia Panel: No results for input(s): VITAMINB12, FOLATE, FERRITIN, TIBC, IRON, RETICCTPCT in the last 72 hours. Sepsis Labs: No results for input(s): PROCALCITON, LATICACIDVEN in the last 168 hours.  Recent Results (from the past 240 hour(s))  Resp Panel by RT-PCR (Flu A&B, Covid) Nasopharyngeal Swab     Status: None   Collection Time: 12/22/21  7:45 PM   Specimen: Nasopharyngeal Swab; Nasopharyngeal(NP) swabs in vial transport medium  Result Value Ref Range Status   SARS Coronavirus 2 by RT PCR NEGATIVE NEGATIVE Final    Comment: (NOTE) SARS-CoV-2 target nucleic acids are NOT DETECTED.  The SARS-CoV-2 RNA is generally detectable in upper respiratory specimens during the acute phase of infection. The lowest concentration of SARS-CoV-2 viral copies this assay can detect is 138 copies/mL. A negative result does not preclude SARS-Cov-2 infection  and should not be used as the sole basis for treatment or other patient management decisions. A negative result may occur with  improper specimen collection/handling, submission of specimen other than nasopharyngeal swab, presence of viral mutation(s) within the areas targeted by this assay, and inadequate number of viral copies(<138 copies/mL). A negative result must be combined with clinical observations, patient history, and epidemiological information. The expected result is Negative.  Fact Sheet for Patients:  12/24/21  Fact Sheet for Healthcare Providers:  BloggerCourse.com  This test is no t yet approved or cleared by the [place] FDA and  has been authorized for detection and/or diagnosis of SARS-CoV-2 by FDA under an Emergency Use Authorization (EUA). This EUA will remain  in effect (meaning this test can be used) for the duration of the COVID-19 declaration under Section 564(b)(1) of the Act, 21 U.S.C.section 360bbb-3(b)(1), unless the authorization  is terminated  or revoked sooner.       Influenza A by PCR NEGATIVE NEGATIVE Final   Influenza B by PCR NEGATIVE NEGATIVE Final    Comment: (NOTE) The Xpert Xpress SARS-CoV-2/FLU/RSV plus assay is intended as an aid in the diagnosis of influenza from Nasopharyngeal swab specimens and should not be used as a sole basis for treatment. Nasal washings and aspirates are unacceptable for Xpert Xpress SARS-CoV-2/FLU/RSV testing.  Fact Sheet for Patients: BloggerCourse.com  Fact Sheet for Healthcare Providers: SeriousBroker.it  This test is not yet approved or cleared by the [place] FDA and has been authorized for detection and/or diagnosis of SARS-CoV-2 by FDA under an Emergency Use Authorization (EUA). This EUA will remain in effect (meaning this test can be used) for the duration of the COVID-19 declaration  under Section 564(b)(1) of the Act, 21 U.S.C. section 360bbb-3(b)(1), unless the authorization is terminated or revoked.  Performed at [facility], [address] [place], [place] [address]       Radiology Studies: No results found.      Scheduled Meds:  amLODipine  10 mg Oral Daily   carvedilol  25 mg Oral BID WC   Chlorhexidine Gluconate Cloth  6 each Topical Daily   docusate sodium  100 mg Oral BID   enoxaparin (LOVENOX) injection  30 mg Subcutaneous Q24H   famotidine  20 mg Oral Daily   feeding supplement  237 mL Oral BID BM   hydrALAZINE  25 mg Oral TID   levothyroxine  25 mcg Oral Q0600   lidocaine  1 patch Transdermal Q24H   multivitamin with minerals  1 tablet Oral Daily   pantoprazole  20 mg Oral Daily   senna  1 tablet Oral BID   Continuous Infusions:  methocarbamol (ROBAXIN) IV       LOS: 4 days   Time spent:  Azucena Fallen, DO [facility]  If 7PM-7AM, please contact night-coverage [email or web address]  12/26/2021, 7:57 AM

## 2021-12-27 LAB — CBC
HCT: 22 % — ABNORMAL LOW (ref 36.0–46.0)
Hemoglobin: 7.2 g/dL — ABNORMAL LOW (ref 12.0–15.0)
MCH: 31 pg (ref 26.0–34.0)
MCHC: 32.7 g/dL (ref 30.0–36.0)
MCV: 94.8 fL (ref 80.0–100.0)
Platelets: 197 10*3/uL (ref 150–400)
RBC: 2.32 MIL/uL — ABNORMAL LOW (ref 3.87–5.11)
RDW: 13.2 % (ref 11.5–15.5)
WBC: 6.6 10*3/uL (ref 4.0–10.5)
nRBC: 0 % (ref 0.0–0.2)

## 2021-12-27 LAB — BASIC METABOLIC PANEL
Anion gap: 7 (ref 5–15)
BUN: 57 mg/dL — ABNORMAL HIGH (ref 8–23)
CO2: 24 mmol/L (ref 22–32)
Calcium: 8.6 mg/dL — ABNORMAL LOW (ref 8.9–10.3)
Chloride: 105 mmol/L (ref 98–111)
Creatinine, Ser: 1.42 mg/dL — ABNORMAL HIGH (ref 0.44–1.00)
GFR, Estimated: 34 mL/min — ABNORMAL LOW (ref >60)
Glucose, Bld: 108 mg/dL — ABNORMAL HIGH (ref 70–99)
Potassium: 4.1 mmol/L (ref 3.5–5.1)
Sodium: 136 mmol/L (ref 135–145)

## 2021-12-27 NOTE — Care Management Important Message (Signed)
Important Message  Patient Details  Name: Daisy Mora MRN: [id number] Date of Birth: 07/13/1926   Medicare Important Message Given:  Yes     Olegario Messier A Willi Borowiak 12/27/2021, 11:48 AM

## 2021-12-27 NOTE — TOC Progression Note (Addendum)
Transition of Care [facility]) - Progression Note    Patient Details  Name: Daisy Mora MRN: [id number] Date of Birth: 11/01/1926  [facility] CM/SW Contact  Marlowe Sax, RN Phone Number: 12/27/2021, 2:22 PM  Clinical Narrative:    Submitted clinical notes to [facility] to obtain Insurance approval ref number [id number]  Auth approved 1/3-01/25 2023   Expected Discharge Plan: Skilled Nursing Facility Barriers to Discharge: Continued Medical Work up  Expected Discharge Plan and Services Expected Discharge Plan: Skilled Nursing Facility       Living arrangements for the past 2 months: Single Family Home                                       Social Determinants of Health (SDOH) Interventions    Readmission Risk Interventions Readmission Risk Prevention Plan 12/24/2021  Transportation Screening Complete  PCP or Specialist Appt within 5-7 Days Complete  Home Care Screening Complete  Medication Review (RN CM) Complete  Some recent data might be hidden

## 2021-12-27 NOTE — Progress Notes (Signed)
Physical Therapy Treatment Patient Details Name: Daisy Mora MRN: [id number] DOB: [birth year] Today's Date: 12/27/2021   History of Present Illness Daisy Mora is a 89yoF who comes to [facility] on 12/22/21 after fall, imaging revealing of Lt femur fracture. Pt went to theater with Dr. Scherry Ran on 12/29 for Left femur ORIF c IM hardware. Pt was carryng laundry from kitchen to next room when she tripped over a single step threshold. At baseline pt live3s alone, fully independent, no previous falls, uses a SPC when AMB outside of home.    PT Comments    Pt received in bed agreeable to PT. Began session with reviewing HEP packet with performing all exercises. Pt does require intermittent AAROM for proximal musculare targeted exercises due to pain and weakness.  Able to transfer to EOB with minguard and bed features with increased time. Able to stand with minA with bed elevated. Only able to progress ambulation to ~5' with continued, limited L foot clearance but is able to intermittently clear L foot off of ground to progress LLE but minimally. Pt remains limited in further ambulatory distances due to weakness, fatigue and dizziness with exertional activity. HR and SPO2 assessed and WNL. BP has been on lower end per EMR. Pt seated in recliner post session with all needs in reach and reporting dizziness has cleared up. Continuing to recommend STR upon d/c to optimize strength/mobility, safe use of DME to reduce risk of falls and return to independence for ADL/IADL completion.   Recommendations for follow up therapy are one component of a multi-disciplinary discharge planning process, led by the attending physician.  Recommendations may be updated based on patient status, additional functional criteria and insurance authorization.  Follow Up Recommendations  Skilled nursing-short term rehab (<3 hours/day)     Assistance Recommended at Discharge Intermittent Supervision/Assistance  Equipment  Recommendations  Rolling walker (2 wheels)    Recommendations for Other Services       Precautions / Restrictions Precautions Precautions: Fall Restrictions Weight Bearing Restrictions: Yes LLE Weight Bearing: Weight bearing as tolerated     Mobility  Bed Mobility Overal bed mobility: Needs Assistance Bed Mobility: Supine to Sit     Supine to sit: Min guard;HOB elevated       Patient Response: Cooperative  Transfers Overall transfer level: Needs assistance Equipment used: Rolling walker (2 wheels) Transfers: Sit to/from Stand Sit to Stand: Min assist;From elevated surface           General transfer comment: minA to stand with cues for hand placement and bed elevated.    Ambulation/Gait Ambulation/Gait assistance: Min assist Gait Distance (Feet): 5 Feet Assistive device: Rolling walker (2 wheels) Gait Pattern/deviations: Step-to pattern;Decreased step length - right;Decreased stance time - left;Decreased stride length;Decreased weight shift to left;Antalgic       General Gait Details: Able to intermittently clear LLE from floor but still utilizes scooting LLE on floor for majority of steps to progress LLE.   Stairs             Wheelchair Mobility    Modified Rankin (Stroke Patients Only)       Balance Overall balance assessment: Needs assistance Sitting-balance support: Bilateral upper extremity supported;Feet supported Sitting balance-Leahy Scale: Fair     Standing balance support: Bilateral upper extremity supported;During functional activity Standing balance-Leahy Scale: Fair Standing balance comment: requires BUE support on RW.  Cognition Arousal/Alertness: Awake/alert Behavior During Therapy: WFL for tasks assessed/performed Overall Cognitive Status: Within Functional Limits for tasks assessed                                          Exercises Total Joint Exercises Ankle  Circles/Pumps: AROM;Strengthening;Both;15 reps;Supine Quad Sets: AROM;Strengthening;Left;10 reps;Supine Gluteal Sets: AROM;Supine;Both;10 reps Towel Squeeze: AROM;Supine;Both;10 reps Short Arc Quad: AROM;AAROM;Left;10 reps;Supine Heel Slides: AAROM;Left;10 reps;Supine Hip ABduction/ADduction: AROM;Strengthening;Left;10 reps;Supine Straight Leg Raises: AAROM;Supine;Strengthening;Left;10 reps Long Arc Quad: AAROM;Supine;Strengthening;Left;10 reps Other Exercises Other Exercises: L weight shifts onto LLE in standing for strength and improved tolerance for greater WB'ing for ambulation. x5.    General Comments        Pertinent Vitals/Pain Pain Assessment: Faces Faces Pain Scale: Hurts a little bit Pain Location: No pain at rest, does wince and grimace with mobility. Pain Descriptors / Indicators: Aching;Discomfort Pain Intervention(s): Limited activity within patient's tolerance;Repositioned    Home Living                          Prior Function            PT Goals (current goals can now be found in the care plan section) Acute Rehab PT Goals Patient Stated Goal: return to living independently at home PT Goal Formulation: With patient Time For Goal Achievement: 01/07/22 Potential to Achieve Goals: Fair Progress towards PT goals: Progressing toward goals    Frequency    BID      PT Plan Current plan remains appropriate    Co-evaluation              AM-PAC PT "6 Clicks" Mobility   Outcome Measure  Help needed turning from your back to your side while in a flat bed without using bedrails?: A Little Help needed moving from lying on your back to sitting on the side of a flat bed without using bedrails?: A Little Help needed moving to and from a bed to a chair (including a wheelchair)?: A Lot Help needed standing up from a chair using your arms (e.g., wheelchair or bedside chair)?: A Lot Help needed to walk in hospital room?: A Lot Help needed climbing  3-5 steps with a railing? : Total 6 Click Score: 13    End of Session Equipment Utilized During Treatment: Gait belt Activity Tolerance: Patient tolerated treatment well;Treatment limited secondary to medical complications (Comment) (dizziness) Patient left: in chair;with call bell/phone within reach;with chair alarm set Nurse Communication: Mobility status PT Visit Diagnosis: Difficulty in walking, not elsewhere classified (R26.2);Other abnormalities of gait and mobility (R26.89)     Time: 4825-0037 PT Time Calculation (min) (ACUTE ONLY): 25 min  Charges:  $Therapeutic Exercise: 23-37 mins                     Martell Mcfadyen M. Fairly IV, PT, DPT Physical Therapist-   [facility]  12/27/2021, 1:37 PM

## 2021-12-27 NOTE — Progress Notes (Signed)
Physical Therapy Treatment Patient Details Name: Daisy Mora MRN: [id number] DOB: [birth year] Today's Date: 12/27/2021   History of Present Illness Daisy Mora is a 89yoF who comes to [facility] on 12/22/21 after fall, imaging revealing of Lt femur fracture. Pt went to theater with Dr. Scherry Ran on 12/29 for Left femur ORIF c IM hardware. Pt was carryng laundry from kitchen to next room when she tripped over a single step threshold. At baseline pt live3s alone, fully independent, no previous falls, uses a SPC when AMB outside of home.    PT Comments    Pt in recliner agreeable to p.m. session. Endorses being tired sitting in recliner. Pt improving with transfer with ability to stand from recliner which is a low surface for pt, with minA and good hand placement. Improving ability to clear LLE intermittently but does still heavily rely on scooting L foot on ground due to pain and weakness. Heavy UE support remains on RW to assist in offloading LLE.  Pt does require motivation and encouragement to progress past 4' with ability to turn after ambulating forwards and side step to the L towards [facility] an additional 4' with good RW sequencing. Upon sitting EOB pt does require maxA for LE management and for scooting up in bed. Denies any dizziness this session. Will continue to benefit from STR at d/c to optimize return to functional mobility with LRAD to return to independence with ADL/IADL tasks.   Recommendations for follow up therapy are one component of a multi-disciplinary discharge planning process, led by the attending physician.  Recommendations may be updated based on patient status, additional functional criteria and insurance authorization.  Follow Up Recommendations  Skilled nursing-short term rehab (<3 hours/day)     Assistance Recommended at Discharge Intermittent Supervision/Assistance  Equipment Recommendations  Rolling walker (2 wheels)    Recommendations for Other Services        Precautions / Restrictions Precautions Precautions: Fall Restrictions Weight Bearing Restrictions: Yes LLE Weight Bearing: Weight bearing as tolerated     Mobility  Bed Mobility Overal bed mobility: Needs Assistance Bed Mobility: Sit to Supine     Supine to sit: Max assist     General bed mobility comments: In recliner pre session. Required maxA at LE's to return to supine in bed. Patient Response: Cooperative  Transfers Overall transfer level: Needs assistance Equipment used: Rolling walker (2 wheels) Transfers: Sit to/from Stand Sit to Stand: Min assist           General transfer comment: MinA to stand from lowered surface (recliner). Good hand placement    Ambulation/Gait Ambulation/Gait assistance: Min guard Gait Distance (Feet): 8 Feet (2x4'. forwards 4' and side stepping 4' back to [facility].) Assistive device: Rolling walker (2 wheels) Gait Pattern/deviations: Step-to pattern;Decreased step length - right;Decreased stance time - left;Decreased stride length;Decreased weight shift to left;Antalgic       General Gait Details: Able to intermittently clear LLE from floor but still utilizes scooting LLE on floor for majority of steps to progress LLE.   Stairs             Wheelchair Mobility    Modified Rankin (Stroke Patients Only)       Balance Overall balance assessment: Needs assistance Sitting-balance support: Bilateral upper extremity supported;Feet supported Sitting balance-Leahy Scale: Fair     Standing balance support: Bilateral upper extremity supported;During functional activity Standing balance-Leahy Scale: Fair Standing balance comment: requires BUE support on RW.  High level balance activites: Side stepping High Level Balance Comments: L side steps towards HOB            Cognition Arousal/Alertness: Awake/alert Behavior During Therapy: WFL for tasks assessed/performed Overall Cognitive Status: Within Functional  Limits for tasks assessed                                          Exercises Total Joint Exercises Ankle Circles/Pumps: AROM;Strengthening;Both;15 reps;Supine Quad Sets: AROM;Strengthening;Left;10 reps;Supine Gluteal Sets: AROM;Supine;Both;10 reps Towel Squeeze: AROM;Supine;Both;10 reps Short Arc Quad: AROM;AAROM;Left;10 reps;Supine Heel Slides: AAROM;Left;10 reps;Supine Hip ABduction/ADduction: AROM;Strengthening;Left;10 reps;Supine Straight Leg Raises: AAROM;Supine;Strengthening;Left;10 reps Long Arc Quad: AAROM;Left;10 reps;Seated;Strengthening General Exercises - Lower Extremity Hip Flexion/Marching: AAROM;10 reps;Strengthening;Seated;Left Other Exercises Other Exercises: Benefits in progressing walking distance for strength and return to independence in ADL's    General Comments        Pertinent Vitals/Pain Pain Assessment: Faces Faces Pain Scale: Hurts a little bit Pain Location: No pain at rest, does wince and grimace with mobility. Pain Descriptors / Indicators: Aching;Discomfort Pain Intervention(s): Limited activity within patient's tolerance;Repositioned    Home Living                          Prior Function            PT Goals (current goals can now be found in the care plan section) Acute Rehab PT Goals Patient Stated Goal: return to living independently at home PT Goal Formulation: With patient Time For Goal Achievement: 01/07/22 Potential to Achieve Goals: Fair Progress towards PT goals: Progressing toward goals    Frequency    BID      PT Plan Current plan remains appropriate    Co-evaluation              AM-PAC PT "6 Clicks" Mobility   Outcome Measure  Help needed turning from your back to your side while in a flat bed without using bedrails?: A Little Help needed moving from lying on your back to sitting on the side of a flat bed without using bedrails?: A Little Help needed moving to and from a bed to a  chair (including a wheelchair)?: A Lot Help needed standing up from a chair using your arms (e.g., wheelchair or bedside chair)?: A Lot Help needed to walk in hospital room?: A Lot Help needed climbing 3-5 steps with a railing? : Total 6 Click Score: 13    End of Session Equipment Utilized During Treatment: Gait belt Activity Tolerance: Patient tolerated treatment well Patient left: in bed;with bed alarm set;with call bell/phone within reach Nurse Communication: Mobility status PT Visit Diagnosis: Difficulty in walking, not elsewhere classified (R26.2);Other abnormalities of gait and mobility (R26.89)     Time: 0240-9735 PT Time Calculation (min) (ACUTE ONLY): 16 min  Charges:  $Gait Training: 8-22 mins $Therapeutic Exercise: 23-37 mins                     Daisy Mora M. Fairly IV, PT, DPT Physical Therapist- [facility]  [facility]  12/27/2021, 2:18 PM

## 2021-12-27 NOTE — Progress Notes (Addendum)
PROGRESS NOTE    Daisy Mora  WGN:[id number] DOB: 12/14/1926 DOA: 12/22/2021 PCP: System, Provider Not In   Brief Narrative:  Daisy GravelBernice O Mora is a 88 y.o. female with medical history significant for hypertension, GERD, who presents emergency department for chief concerns of a fall. Noted a mechanical fall where she tripped, lost her balance, with laundry in her arms and unable to catch herself. She reports that when she fell she did not hit her head, loss of consciousness, or pass out.  She states that the pain was not immediate.  She states that when she attempts to ambulate or get up and or bear weight, the pain was a 10 out of 10. Presented to the ED where imaging confirmed Left femur fracture - ortho called for consult   Assessment & Plan:  Acute comminuted intertrochanteric fracture of the left proximal femur -Secondary to mechanical fall  -Orthopedics following, status post ORIF 12/29 - tolerated well -Discontinue p.o. narcotics given nausea overnight, transition to tramadol as needed with breakthrough IV narcotics only as necessary -PT OT to follow, likely disposition SNF given patient's age and fracture type   Essential hypertension Initially uncontrolled, subsequently hypotensive -Initially uncontrolled in the setting of pain -Continue Coreg and amlodipine -discontinue hydralazine   Acute blood loss anemia on chronic anemia of chronic disease -follow morning labs, likely postoperative blood loss on chronic anemia Hypothyroidism, stable -continue levothyroxine 25 mcg daily GERD-PPI resumed  DVT prophylaxis: Per orthopedics Code Status: DNR Family Communication: At bedside  Status is: Inpatient  Dispo: The patient is from: Home              Anticipated d/c is to: SNF              Anticipated d/c date is: 24 hours              Patient currently not medically stable for discharge  Consultants:  Orthopedic surgery  Procedures:  ORIF left femur  12/23/2021  Antimicrobials:  None indicated  Subjective: No acute issues or events overnight denies nausea vomiting diarrhea constipation headache fevers chills or chest pain.  Left knee pain ongoing moderately well controlled; left hip pain well controlled.  Objective: Vitals:   12/26/21 0746 12/26/21 1600 12/26/21 2021 12/27/21 0418  BP: (!) 126/44 (!) 143/49 (!) 135/47 (!) 147/51  Pulse: 64 99 71 75  Resp: 18 18 20 20   Temp: 98.4 F (36.9 C) 98.4 F (36.9 C) 99.5 F (37.5 C) 98.5 F (36.9 C)  TempSrc:      SpO2: 94% 93% 95% 96%  Weight:      Height:       No intake or output data in the 24 hours ending 12/27/21 0732  Filed Weights   12/22/21 1745  Weight: 59 kg    Examination:  General:  Pleasantly resting in bed, No acute distress. HEENT:  Normocephalic atraumatic.  Sclerae nonicteric, noninjected.  Extraocular movements intact bilaterally. Neck:  Without mass or deformity.  Trachea is midline. Lungs:  Clear to auscultate bilaterally without rhonchi, wheeze, or rales. Heart:  Regular rate and rhythm.  Without murmurs, rubs, or gallops. Abdomen:  Soft, nontender, nondistended.  Without guarding or rebound. Extremities: Without cyanosis, clubbing, edema Vascular:  Dorsalis pedis and posterior tibial pulses palpable bilaterally. Skin:  Warm and dry, no erythema, no ulcerations, left hip skin bandage clean/dry/intact   Data Reviewed: I have personally reviewed following labs and imaging studies  CBC: Recent Labs  Lab 12/22/21 1945 12/23/21  8366 12/24/21 0732 12/25/21 0549 12/26/21 0643 12/27/21 0501  WBC 9.7 6.5 10.0 9.1 7.8 6.6  NEUTROABS 7.7  --   --   --   --   --   HGB 11.1* 10.0* 8.7* 7.7* 7.1* 7.2*  HCT 33.8* 29.9* 26.5* 23.3* 21.4* 22.0*  MCV 94.9 94.0 94.3 92.5 92.6 94.8  PLT 204 177 163 150 164 197    Basic Metabolic Panel: Recent Labs  Lab 12/23/21 0615 12/24/21 0732 12/25/21 0549 12/26/21 0643 12/27/21 0501  NA 135 133* 134* 134* 136   K 3.8 4.0 3.9 3.9 4.1  CL 109 105 103 103 105  CO2 23 23 22 22 24   GLUCOSE 101* 119* 100* 107* 108*  BUN 27* 28* 35* 51* 57*  CREATININE 1.15* 1.24* 1.41* 1.59* 1.42*  CALCIUM 8.3* 8.1* 8.5* 8.7* 8.6*    GFR: Estimated Creatinine Clearance: 20.5 mL/min (A) (by C-G formula based on SCr of 1.42 mg/dL (H)). Liver Function Tests: Recent Labs  Lab 12/22/21 1945  AST 22  ALT 16  ALKPHOS 57  BILITOT 0.8  PROT 6.6  ALBUMIN 3.7    No results for input(s): LIPASE, AMYLASE in the last 168 hours. No results for input(s): AMMONIA in the last 168 hours. Coagulation Profile: No results for input(s): INR, PROTIME in the last 168 hours. Cardiac Enzymes: No results for input(s): CKTOTAL, CKMB, CKMBINDEX, TROPONINI in the last 168 hours. BNP (last 3 results) No results for input(s): PROBNP in the last 8760 hours. HbA1C: No results for input(s): HGBA1C in the last 72 hours. CBG: No results for input(s): GLUCAP in the last 168 hours. Lipid Profile: No results for input(s): CHOL, HDL, LDLCALC, TRIG, CHOLHDL, LDLDIRECT in the last 72 hours. Thyroid Function Tests: No results for input(s): TSH, T4TOTAL, FREET4, T3FREE, THYROIDAB in the last 72 hours. Anemia Panel: No results for input(s): VITAMINB12, FOLATE, FERRITIN, TIBC, IRON, RETICCTPCT in the last 72 hours. Sepsis Labs: No results for input(s): PROCALCITON, LATICACIDVEN in the last 168 hours.  Recent Results (from the past 240 hour(s))  Resp Panel by RT-PCR (Flu A&B, Covid) Nasopharyngeal Swab     Status: None   Collection Time: 12/22/21  7:45 PM   Specimen: Nasopharyngeal Swab; Nasopharyngeal(NP) swabs in vial transport medium  Result Value Ref Range Status   SARS Coronavirus 2 by RT PCR NEGATIVE NEGATIVE Final    Comment: (NOTE) SARS-CoV-2 target nucleic acids are NOT DETECTED.  The SARS-CoV-2 RNA is generally detectable in upper respiratory specimens during the acute phase of infection. The lowest concentration of SARS-CoV-2  viral copies this assay can detect is 138 copies/mL. A negative result does not preclude SARS-Cov-2 infection and should not be used as the sole basis for treatment or other patient management decisions. A negative result may occur with  improper specimen collection/handling, submission of specimen other than nasopharyngeal swab, presence of viral mutation(s) within the areas targeted by this assay, and inadequate number of viral copies(<138 copies/mL). A negative result must be combined with clinical observations, patient history, and epidemiological information. The expected result is Negative.  Fact Sheet for Patients:  12/24/21  Fact Sheet for Healthcare Providers:  BloggerCourse.com  This test is no t yet approved or cleared by the [place] FDA and  has been authorized for detection and/or diagnosis of SARS-CoV-2 by FDA under an Emergency Use Authorization (EUA). This EUA will remain  in effect (meaning this test can be used) for the duration of the COVID-19 declaration under Section 564(b)(1) of the Act,  21 U.S.C.section 360bbb-3(b)(1), unless the authorization is terminated  or revoked sooner.       Influenza A by PCR NEGATIVE NEGATIVE Final   Influenza B by PCR NEGATIVE NEGATIVE Final    Comment: (NOTE) The Xpert Xpress SARS-CoV-2/FLU/RSV plus assay is intended as an aid in the diagnosis of influenza from Nasopharyngeal swab specimens and should not be used as a sole basis for treatment. Nasal washings and aspirates are unacceptable for Xpert Xpress SARS-CoV-2/FLU/RSV testing.  Fact Sheet for Patients: BloggerCourse.com  Fact Sheet for Healthcare Providers: SeriousBroker.it  This test is not yet approved or cleared by the [place] FDA and has been authorized for detection and/or diagnosis of SARS-CoV-2 by FDA under an Emergency Use Authorization  (EUA). This EUA will remain in effect (meaning this test can be used) for the duration of the COVID-19 declaration under Section 564(b)(1) of the Act, 21 U.S.C. section 360bbb-3(b)(1), unless the authorization is terminated or revoked.  Performed at [facility], [address] [place], [place] [address]       Radiology Studies: No results found.      Scheduled Meds:  amLODipine  10 mg Oral Daily   carvedilol  25 mg Oral BID WC   Chlorhexidine Gluconate Cloth  6 each Topical Daily   docusate sodium  100 mg Oral BID   enoxaparin (LOVENOX) injection  30 mg Subcutaneous Q24H   famotidine  20 mg Oral Daily   feeding supplement  237 mL Oral BID BM   hydrALAZINE  25 mg Oral TID   levothyroxine  25 mcg Oral Q0600   lidocaine  1 patch Transdermal Q24H   multivitamin with minerals  1 tablet Oral Daily   pantoprazole  20 mg Oral Daily   senna  1 tablet Oral BID   Continuous Infusions:  methocarbamol (ROBAXIN) IV       LOS: 5 days   Time spent:  Azucena Fallen, DO [facility]  If 7PM-7AM, please contact night-coverage [email or web address]  12/27/2021, 7:32 AM

## 2021-12-28 LAB — BASIC METABOLIC PANEL
Anion gap: 8 (ref 5–15)
BUN: 52 mg/dL — ABNORMAL HIGH (ref 8–23)
CO2: 24 mmol/L (ref 22–32)
Calcium: 8.5 mg/dL — ABNORMAL LOW (ref 8.9–10.3)
Chloride: 109 mmol/L (ref 98–111)
Creatinine, Ser: 1.36 mg/dL — ABNORMAL HIGH (ref 0.44–1.00)
GFR, Estimated: 36 mL/min — ABNORMAL LOW (ref >60)
Glucose, Bld: 110 mg/dL — ABNORMAL HIGH (ref 70–99)
Potassium: 4.6 mmol/L (ref 3.5–5.1)
Sodium: 141 mmol/L (ref 135–145)

## 2021-12-28 LAB — RESP PANEL BY RT-PCR (FLU A&B, COVID) ARPGX2
Influenza A by PCR: NEGATIVE
Influenza B by PCR: NEGATIVE
SARS Coronavirus 2 by RT PCR: NEGATIVE

## 2021-12-28 LAB — CBC
HCT: 22.5 % — ABNORMAL LOW (ref 36.0–46.0)
Hemoglobin: 7.4 g/dL — ABNORMAL LOW (ref 12.0–15.0)
MCH: 31.4 pg (ref 26.0–34.0)
MCHC: 32.9 g/dL (ref 30.0–36.0)
MCV: 95.3 fL (ref 80.0–100.0)
Platelets: 217 10*3/uL (ref 150–400)
RBC: 2.36 MIL/uL — ABNORMAL LOW (ref 3.87–5.11)
RDW: 13.2 % (ref 11.5–15.5)
WBC: 6.3 10*3/uL (ref 4.0–10.5)
nRBC: 0.5 % — ABNORMAL HIGH (ref 0.0–0.2)

## 2021-12-28 MED ORDER — POLYETHYLENE GLYCOL 3350 17 G PO PACK: 17.0000 g | PACK | Freq: Every day | ORAL | 0 refills | Status: AC | PRN

## 2021-12-28 MED ORDER — ACETAMINOPHEN 325 MG PO TABS
325.0000 mg | ORAL_TABLET | Freq: Four times a day (QID) | ORAL | 0 refills | Status: AC | PRN
Start: 2021-12-28 — End: ?

## 2021-12-28 NOTE — Progress Notes (Signed)
Physical Therapy Treatment Patient Details Name: Daisy Mora MRN: [id number] DOB: 01-13-1926 Today's Date: 12/28/2021   History of Present Illness Daisy Mora is a 59yoF who comes to [facility] on 12/22/21 after fall, imaging revealing of Lt femur fracture. Pt went to theater with Dr. Wynetta Emery on 12/29 for Left femur ORIF c IM hardware. Pt was carryng laundry from kitchen to next room when she tripped over a single step threshold. At baseline pt live3s alone, fully independent, no previous falls, uses a SPC when AMB outside of home.    PT Comments    Pt received in supine position and agreeable to therapy.  Pt able to navigate be mobility with mod assist for L LE mobility, but otherwise demonstrates adequate core control when performing bed mobility.  Pt able to perform transfer with CGA today and then safely navigate with walker to the foot of the bed and back to the recliner.  Pt with some noticeable pain upon standing and ambulation, but was able to lift L LE more consistently during ambulation today.  Pt made it to the foot of the bed and then requested to go back to the reliner due to fatigue.  Pt transferred with good technique to the recliner and was left with all needs met.     Recommendations for follow up therapy are one component of a multi-disciplinary discharge planning process, led by the attending physician.  Recommendations may be updated based on patient status, additional functional criteria and insurance authorization.  Follow Up Recommendations  Skilled nursing-short term rehab (<3 hours/day)     Assistance Recommended at Discharge Intermittent Supervision/Assistance  Patient can return home with the following     Equipment Recommendations  Rolling walker (2 wheels)    Recommendations for Other Services       Precautions / Restrictions Precautions Precautions: Fall Restrictions Weight Bearing Restrictions: Yes LLE Weight Bearing: Weight bearing as tolerated      Mobility  Bed Mobility   Bed Mobility: Sit to Supine     Supine to sit: Mod assist     General bed mobility comments: Pt requires modA for L LE mobility in the bed.  Pt has good core strength in order to transfer and have upright posture during transfer.    Transfers Overall transfer level: Needs assistance Equipment used: Rolling walker (2 wheels) Transfers: Sit to/from Stand Sit to Stand: Min assist           General transfer comment: MinA to stand from lowered surface (recliner). Good hand placement    Ambulation/Gait Ambulation/Gait assistance: Min guard Gait Distance (Feet): 12 Feet Assistive device: Rolling walker (2 wheels) Gait Pattern/deviations: Step-to pattern;Decreased step length - right;Decreased stance time - left;Decreased stride length;Decreased weight shift to left;Antalgic Gait velocity: decreased     General Gait Details: Pt able to clear LLE much better with maximal encouragement today, but continues to report pain in the L knee/hip.   Stairs             Wheelchair Mobility    Modified Rankin (Stroke Patients Only)       Balance Overall balance assessment: Needs assistance Sitting-balance support: Bilateral upper extremity supported;Feet supported Sitting balance-Leahy Scale: Fair     Standing balance support: Bilateral upper extremity supported;During functional activity Standing balance-Leahy Scale: Fair Standing balance comment: requires BUE support on RW.               High Level Balance Comments: L side steps towards [facility]  Cognition Arousal/Alertness: Awake/alert Behavior During Therapy: WFL for tasks assessed/performed Overall Cognitive Status: Within Functional Limits for tasks assessed                                          Exercises Other Exercises Other Exercises: Benefits in progressing walking distance for strength and return to independence in ADL's    General  Comments        Pertinent Vitals/Pain Pain Assessment: Faces Faces Pain Scale: Hurts a little bit Pain Location: No pain at rest, does wince and grimace with mobility. Pain Descriptors / Indicators: Aching;Discomfort Pain Intervention(s): Limited activity within patient's tolerance;Repositioned    Home Living                          Prior Function            PT Goals (current goals can now be found in the care plan section) Acute Rehab PT Goals Patient Stated Goal: return to living independently at home PT Goal Formulation: With patient Time For Goal Achievement: 01/07/22 Potential to Achieve Goals: Fair Progress towards PT goals: Progressing toward goals    Frequency    BID      PT Plan Current plan remains appropriate    Co-evaluation              AM-PAC PT "6 Clicks" Mobility   Outcome Measure  Help needed turning from your back to your side while in a flat bed without using bedrails?: A Little Help needed moving from lying on your back to sitting on the side of a flat bed without using bedrails?: A Little Help needed moving to and from a bed to a chair (including a wheelchair)?: A Lot Help needed standing up from a chair using your arms (e.g., wheelchair or bedside chair)?: A Lot Help needed to walk in hospital room?: A Lot Help needed climbing 3-5 steps with a railing? : Total 6 Click Score: 13    End of Session Equipment Utilized During Treatment: Gait belt Activity Tolerance: Patient tolerated treatment well Patient left: in bed;with bed alarm set;with call bell/phone within reach Nurse Communication: Mobility status PT Visit Diagnosis: Difficulty in walking, not elsewhere classified (R26.2);Other abnormalities of gait and mobility (R26.89)     Time: 3007-6226 PT Time Calculation (min) (ACUTE ONLY): 16 min  Charges:  $Gait Training: 8-22 mins                     Gwenlyn Saran, PT, DPT 12/28/21, 3:24 PM    Christie Nottingham 12/28/2021, 3:07 PM

## 2021-12-28 NOTE — TOC Progression Note (Addendum)
Transition of Care [facility]) - Progression Note    Patient Details  Name: Daisy Mora MRN: [id number] Date of Birth: Apr 30, 1926  Transition of Care [facility]) CM/SW [place], RN Phone Number: 12/28/2021, 3:52 PM  Clinical Narrative:   Called EMS to transport the patient to [facility], she is 6th on list,  Called daughter Daisy Mora to nitfy that she will be going to room 513 at [facility] to reach, Daughter Daisy Mora was in the room and is aware   Expected Discharge Plan: [place] Barriers to Discharge: Continued Medical Work up  Expected Discharge Plan and Services Expected Discharge Plan: [place] arrangements for the past 2 months: Single Family Home Expected Discharge Date: 12/28/21                                     Social Determinants of Health (SDOH) Interventions    Readmission Risk Interventions Readmission Risk Prevention Plan 12/24/2021  Transportation Screening Complete  PCP or Specialist Appt within 5-7 Days Complete  Home Care Screening Complete  Medication Review (RN CM) Complete  Some recent data might be hidden

## 2021-12-28 NOTE — Progress Notes (Signed)
Report called to Melody at [facility].  ?

## 2021-12-28 NOTE — Progress Notes (Signed)
PT Cancellation Note  Patient Details Name: Daisy Mora MRN: [id number] DOB: [birth year]   Cancelled Treatment:    Reason Eval/Treat Not Completed: Other (comment).  Attempted to see pt for PM session, however pt notes that she is getting ready for d/c at this moment.  Pt declined mobility and exercises at this time.  Pt will continue with therapy as medically appropriate during hospital stay.   Nolon Bussing, PT, DPT 12/28/21, 4:26 PM

## 2021-12-28 NOTE — Plan of Care (Signed)
Pt to be discharged to [facility] via EMS. Family at bedside. Belongings given to pt's daughter

## 2021-12-28 NOTE — Plan of Care (Signed)
  Problem: Education: Goal: Knowledge of General Education information will improve Description: Including pain rating scale, medication(s)/side effects and non-pharmacologic comfort measures Outcome: Progressing   Problem: Clinical Measurements: Goal: Ability to maintain clinical measurements within normal limits will improve Outcome: Progressing   

## 2021-12-28 NOTE — Plan of Care (Signed)
°  Problem: Education: Goal: Knowledge of General Education information will improve Description: Including pain rating scale, medication(s)/side effects and non-pharmacologic comfort measures 12/28/2021 0649 by Tamsen Snider, RN Outcome: Progressing 12/28/2021 0644 by Tamsen Snider, RN Outcome: Progressing   Problem: Clinical Measurements: Goal: Ability to maintain clinical measurements within normal limits will improve Outcome: Progressing

## 2021-12-28 NOTE — Progress Notes (Signed)
°  Subjective:  POD #5  s/p intramedullary fixation for left intertrochanteric hip fracture.   Patient reports left hip pain as mild to moderate.  Patient's daughter is at the bedside.  Patient states that she has been improving over the weekend and has been able to progress with physical therapy.  Objective:   VITALS:   Vitals:   12/28/21 0550 12/28/21 0753 12/28/21 0753 12/28/21 1210  BP: (!) 143/50 (!) 140/49 (!) 140/49 (!) 140/46  Pulse: 70 68 66 69  Resp: 18 16 16 18   Temp: 98.7 F (37.1 C) 98.2 F (36.8 C) 98.2 F (36.8 C) 98.4 F (36.9 C)  TempSrc:      SpO2: 95% 95% 95% 97%  Weight:      Height:        PHYSICAL EXAM: Left lower extremity Neurovascular intact Sensation intact distally Intact pulses distally Dorsiflexion/Plantar flexion intact Incision: dressing C/D/I No cellulitis present Compartment soft  LABS  Results for orders placed or performed during the hospital encounter of 12/22/21 (from the past 24 hour(s))  CBC     Status: Abnormal   Collection Time: 12/28/21  5:58 AM  Result Value Ref Range   WBC 6.3 4.0 - 10.5 K/uL   RBC 2.36 (L) 3.87 - 5.11 MIL/uL   Hemoglobin 7.4 (L) 12.0 - 15.0 g/dL   HCT 22.5 (L) 36.0 - 46.0 %   MCV 95.3 80.0 - 100.0 fL   MCH 31.4 26.0 - 34.0 pg   MCHC 32.9 30.0 - 36.0 g/dL   RDW 13.2 11.5 - 15.5 %   Platelets 217 150 - 400 K/uL   nRBC 0.5 (H) 0.0 - 0.2 %  Basic metabolic panel     Status: Abnormal   Collection Time: 12/28/21  5:58 AM  Result Value Ref Range   Sodium 141 135 - 145 mmol/L   Potassium 4.6 3.5 - 5.1 mmol/L   Chloride 109 98 - 111 mmol/L   CO2 24 22 - 32 mmol/L   Glucose, Bld 110 (H) 70 - 99 mg/dL   BUN 52 (H) 8 - 23 mg/dL   Creatinine, Ser 1.36 (H) 0.44 - 1.00 mg/dL   Calcium 8.5 (L) 8.9 - 10.3 mg/dL   GFR, Estimated 36 (L) >60 mL/min   Anion gap 8 5 - 15    No results found.  Assessment/Plan: 5 Days Post-Op   Principal Problem:   Femur fracture, left (HCC) Active Problems:   Essential  hypertension   GERD (gastroesophageal reflux disease)   Acquired hypothyroidism  Patient is due to be discharged to a skilled nursing facility.  She will follow-up in my office in 10 to 14 days for reevaluation and staple removal.  Patient is weightbearing as tolerated on left lower extremity.  Patient should continue Lovenox or aspirin for DVT prophylaxis until follow-up in the office.    Thornton Park , MD 12/28/2021, 12:42 PM

## 2021-12-28 NOTE — Discharge Summary (Signed)
Physician Discharge Summary  Daisy Mora ANV:[id number] DOB: May 04, 1926 DOA: 12/22/2021  PCP: System, Provider Not In  Admit date: 12/22/2021 Discharge date: 12/28/2021  Admitted From: Home Disposition: SNF  Recommendations for Outpatient Follow-up:  Follow up with PCP in 1-2 weeks Please obtain BMP/CBC in one week Please follow up with orthopedic surgery as scheduled  Discharge Condition: Stable CODE STATUS: DNR Diet recommendation: Regular diet as tolerated  Brief/Interim Summary: Daisy Mora is a 88 y.o. female with medical history significant for hypertension, GERD, who presents emergency department for chief concerns of a fall. Noted a mechanical fall where she tripped, lost her balance, with laundry in her arms and unable to catch herself. She reports that when she fell she did not hit her head, loss of consciousness, or pass out.  She states that the pain was not immediate.  She states that when she attempts to ambulate or get up and or bear weight, the pain was a 10 out of 10. Presented to the ED where imaging confirmed Left femur fracture - ortho called for consult    Assessment & Plan:   Acute comminuted intertrochanteric fracture of the left proximal femur -Secondary to mechanical fall  -Orthopedics following, status post ORIF 12/29 - tolerated well -Pain well controlled currently on OTC and NSAIDs, discontinue narcotics -PT OT to follow, likely disposition SNF given patient's age and fracture type   Essential hypertension Initially uncontrolled, subsequently hypotensive -Continue Coreg and amlodipine -discontinue hydralazine   Acute blood loss anemia on chronic anemia of chronic disease -stable Hypothyroidism, stable -continue levothyroxine 25 mcg daily GERD-PPI resumed  Discharge Instructions   Allergies as of 12/28/2021       Reactions   Aspirin Tinitus        Medication List     STOP taking these medications    hydrALAZINE 10 MG  tablet Commonly known as: APRESOLINE   hydrALAZINE 25 MG tablet Commonly known as: APRESOLINE   hydrALAZINE 50 MG tablet Commonly known as: APRESOLINE       TAKE these medications    acetaminophen 325 MG tablet Commonly known as: TYLENOL Take 1-2 tablets (325-650 mg total) by mouth every 6 (six) hours as needed for mild pain (pain score 1-3 or temp > 100.5).   amLODipine 10 MG tablet Commonly known as: NORVASC Take 10 mg by mouth daily.   carvedilol 25 MG tablet Commonly known as: COREG Take 25 mg by mouth 2 (two) times daily with a meal.   FISH OIL PO Take by mouth daily.   multivitamin capsule Take 1 capsule by mouth daily.   omeprazole 20 MG tablet Commonly known as: PRILOSEC OTC Take 20 mg by mouth daily.   polyethylene glycol 17 g packet Commonly known as: MIRALAX / GLYCOLAX Take 17 g by mouth daily as needed for mild constipation.   Turmeric 400 MG Caps Take by mouth.   vitamin B-12 500 MCG tablet Commonly known as: CYANOCOBALAMIN Take 500 mcg by mouth daily.        Allergies  Allergen Reactions   Aspirin Tinitus    Consultations: Orthopedic surgery  Procedures/Studies: DG Chest 1 View  Result Date: 12/22/2021 CLINICAL DATA:  Left hip pain.  Status post fall. EXAM: CHEST  1 VIEW COMPARISON:  01/05/2015 FINDINGS: The heart size and mediastinal contours are within normal limits. Aortic atherosclerotic calcifications. Both lungs are clear. The visualized skeletal structures are unremarkable. IMPRESSION: No active disease. Electronically Signed   By: Veronda Prude.D.  On: 12/22/2021 19:09   CT HEAD WO CONTRAST (5MM)  Result Date: 12/22/2021 CLINICAL DATA:  [place] backwards, head trauma EXAM: CT HEAD WITHOUT CONTRAST TECHNIQUE: Contiguous axial images were obtained from the base of the skull through the vertex without intravenous contrast. COMPARISON:  None. FINDINGS: Brain: No acute infarct or hemorrhage. Lateral ventricles and midline  structures are unremarkable. No acute extra-axial fluid collections. No mass effect. Vascular: No hyperdense vessel or unexpected calcification. Skull: Normal. Negative for fracture or focal lesion. Sinuses/Orbits: No acute finding. Other: None. IMPRESSION: 1. No acute intracranial process. Electronically Signed   By: Randa Ngo M.D.   On: 12/22/2021 20:13   CT Cervical Spine Wo Contrast  Result Date: 12/22/2021 CLINICAL DATA:  [place] backwards, neck trauma EXAM: CT CERVICAL SPINE WITHOUT CONTRAST TECHNIQUE: Multidetector CT imaging of the cervical spine was performed without intravenous contrast. Multiplanar CT image reconstructions were also generated. COMPARISON:  None. FINDINGS: Alignment: Minimal anterolisthesis of C3 relative to C4 likely due to degenerative change. Otherwise alignment is anatomic. Skull base and vertebrae: No acute fracture. No primary bone lesion or focal pathologic process. Soft tissues and spinal canal: No prevertebral fluid or swelling. No visible canal hematoma. Disc levels: Multilevel cervical spondylosis greatest at C4-5, C5-6, and C6-7. Multilevel facet hypertrophy greatest at C3-4. Bony fusion across the C4-5 facet joints on the left. Upper chest: Central airway is patent.  Lung apices are clear. Other: Reconstructed images demonstrate no additional findings. IMPRESSION: 1. No acute cervical spine fracture. 2. Multilevel degenerative changes as above. Electronically Signed   By: Randa Ngo M.D.   On: 12/22/2021 20:11   DG C-Arm 1-60 Min-No Report  Result Date: 12/23/2021 CLINICAL DATA:  Surgical internal fixation of left hip fracture. EXAM: DG HIP (WITH OR WITHOUT PELVIS) 2-3V LEFT; DG C-ARM 1-60 MIN-NO REPORT Radiation exposure index: 14.815 mGy. COMPARISON:  December 22, 2021. FINDINGS: Four intraoperative fluoroscopic images were obtained of the left femur. These images demonstrate surgical internal fixation of proximal left femoral intertrochanteric fracture with  intramedullary rod fixation of the left femoral shaft. IMPRESSION: Fluoroscopic guidance provided during surgical internal fixation of proximal left femoral fracture Electronically Signed   By: Marijo Conception M.D.   On: 12/23/2021 12:59   DG HIP UNILAT WITH PELVIS 2-3 VIEWS LEFT  Result Date: 12/23/2021 CLINICAL DATA:  Surgical internal fixation of left hip fracture. EXAM: DG HIP (WITH OR WITHOUT PELVIS) 2-3V LEFT; DG C-ARM 1-60 MIN-NO REPORT Radiation exposure index: 14.815 mGy. COMPARISON:  December 22, 2021. FINDINGS: Four intraoperative fluoroscopic images were obtained of the left femur. These images demonstrate surgical internal fixation of proximal left femoral intertrochanteric fracture with intramedullary rod fixation of the left femoral shaft. IMPRESSION: Fluoroscopic guidance provided during surgical internal fixation of proximal left femoral fracture Electronically Signed   By: Marijo Conception M.D.   On: 12/23/2021 12:59   DG Hip Unilat W or Wo Pelvis 2-3 Views Left  Result Date: 12/22/2021 CLINICAL DATA:  Status post fall EXAM: DG HIP (WITH OR WITHOUT PELVIS) 2-3V LEFT COMPARISON:  None. FINDINGS: Bones are diffusely osteopenic. There is an acute, comminuted intertrochanteric fracture involving the proximal left femur. Mild impaction of the fracture fragments identified. Large stool burden is incidentally noted within the colon and rectum. IMPRESSION: Acute, comminuted intertrochanteric fracture of the proximal left femur. Electronically Signed   By: Kerby Moors M.D.   On: 12/22/2021 19:08   DG FEMUR PORT MIN 2 VIEWS LEFT  Result Date: 12/23/2021 CLINICAL DATA:  Status post IM nail placement EXAM: LEFT FEMUR PORTABLE 2 VIEWS COMPARISON:  None. FINDINGS: Left intertrochanteric fracture transfixed with a intramedullary nail and interlocking femoral neck screw. No hardware failure or complication. Near anatomic alignment. Postsurgical changes in the soft tissues overlying the left hip.  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 IMPRESSION: Interval ORIF of a left intertrochanteric fracture. Electronically Signed   By: Kathreen Devoid M.D.   On: 12/23/2021 14:44     Subjective: No acute issues or events overnight   Discharge Exam: Vitals:   12/28/21 0753 12/28/21 1210  BP: (!) 140/49 (!) 140/46  Pulse: 66 69  Resp: 16 18  Temp: 98.2 F (36.8 C) 98.4 F (36.9 C)  SpO2: 95% 97%   Vitals:   12/28/21 0550 12/28/21 0753 12/28/21 0753 12/28/21 1210  BP: (!) 143/50 (!) 140/49 (!) 140/49 (!) 140/46  Pulse: 70 68 66 69  Resp: 18 16 16 18   Temp: 98.7 F (37.1 C) 98.2 F (36.8 C) 98.2 F (36.8 C) 98.4 F (36.9 C)  TempSrc:      SpO2: 95% 95% 95% 97%  Weight:      Height:        General: Pt is alert, awake, not in acute distress Cardiovascular: RRR, S1/S2 +, no rubs, no gallops Respiratory: CTA bilaterally, no wheezing, no rhonchi Abdominal: Soft, NT, ND, bowel sounds + Extremities: no edema, no cyanosis    The results of significant diagnostics from this hospitalization (including imaging, microbiology, ancillary and laboratory) are listed below for reference.     Microbiology: Recent Results (from the past 240 hour(s))  Resp Panel by RT-PCR (Flu A&B, Covid) Nasopharyngeal Swab     Status: None   Collection Time: 12/22/21  7:45 PM   Specimen: Nasopharyngeal Swab; Nasopharyngeal(NP) swabs in vial transport medium  Result Value Ref Range Status   SARS Coronavirus 2 by RT PCR NEGATIVE NEGATIVE Final    Comment: (NOTE) SARS-CoV-2 target nucleic acids are NOT DETECTED.  The SARS-CoV-2 RNA is generally detectable in upper respiratory specimens during the acute phase of infection. The lowest concentration of SARS-CoV-2 viral copies this assay can detect is 138 copies/mL. A negative result does not preclude SARS-Cov-2 infection and should not be used as the sole basis for treatment or other patient management decisions. A negative  result may occur with  improper specimen collection/handling, submission of specimen other than nasopharyngeal swab, presence of viral mutation(s) within the areas targeted by this assay, and inadequate number of viral copies(<138 copies/mL). A negative result must be combined with clinical observations, patient history, and epidemiological information. The expected result is Negative.  Fact Sheet for Patients:  EntrepreneurPulse.com.au  Fact Sheet for Healthcare Providers:  IncredibleEmployment.be  This test is no t yet approved or cleared by the [place] FDA and  has been authorized for detection and/or diagnosis of SARS-CoV-2 by FDA under an Emergency Use Authorization (EUA). This EUA will remain  in effect (meaning this test can be used) for the duration of the COVID-19 declaration under Section 564(b)(1) of the Act, 21 U.S.C.section 360bbb-3(b)(1), unless the authorization is terminated  or revoked sooner.       Influenza A by PCR NEGATIVE NEGATIVE Final   Influenza B by PCR NEGATIVE NEGATIVE Final    Comment: (NOTE) The Xpert Xpress SARS-CoV-2/FLU/RSV plus assay is intended as an aid in the diagnosis of influenza from Nasopharyngeal swab specimens and should not be used as a sole basis for treatment. Nasal washings and  aspirates are unacceptable for Xpert Xpress SARS-CoV-2/FLU/RSV testing.  Fact Sheet for Patients: EntrepreneurPulse.com.au  Fact Sheet for Healthcare Providers: IncredibleEmployment.be  This test is not yet approved or cleared by the [place] FDA and has been authorized for detection and/or diagnosis of SARS-CoV-2 by FDA under an Emergency Use Authorization (EUA). This EUA will remain in effect (meaning this test can be used) for the duration of the COVID-19 declaration under Section 564(b)(1) of the Act, 21 U.S.C. section 360bbb-3(b)(1), unless the authorization is  terminated or revoked.  Performed at [facility], [address] [place], [place] [address]      Labs: BNP (last 3 results) No results for input(s): BNP in the last 8760 hours. Basic Metabolic Panel: Recent Labs  Lab 12/24/21 0732 12/25/21 0549 12/26/21 0643 12/27/21 0501 12/28/21 0558  NA 133* 134* 134* 136 141  K 4.0 3.9 3.9 4.1 4.6  CL 105 103 103 105 109  CO2 23 22 22 24 24   GLUCOSE 119* 100* 107* 108* 110*  BUN 28* 35* 51* 57* 52*  CREATININE 1.24* 1.41* 1.59* 1.42* 1.36*  CALCIUM 8.1* 8.5* 8.7* 8.6* 8.5*   Liver Function Tests: Recent Labs  Lab 12/22/21 1945  AST 22  ALT 16  ALKPHOS 57  BILITOT 0.8  PROT 6.6  ALBUMIN 3.7   No results for input(s): LIPASE, AMYLASE in the last 168 hours. No results for input(s): AMMONIA in the last 168 hours. CBC: Recent Labs  Lab 12/22/21 1945 12/23/21 0615 12/24/21 0732 12/25/21 0549 12/26/21 0643 12/27/21 0501 12/28/21 0558  WBC 9.7   < > 10.0 9.1 7.8 6.6 6.3  NEUTROABS 7.7  --   --   --   --   --   --   HGB 11.1*   < > 8.7* 7.7* 7.1* 7.2* 7.4*  HCT 33.8*   < > 26.5* 23.3* 21.4* 22.0* 22.5*  MCV 94.9   < > 94.3 92.5 92.6 94.8 95.3  PLT 204   < > 163 150 164 197 217   < > = values in this interval not displayed.   Cardiac Enzymes: No results for input(s): CKTOTAL, CKMB, CKMBINDEX, TROPONINI in the last 168 hours. BNP: Invalid input(s): POCBNP CBG: No results for input(s): GLUCAP in the last 168 hours. D-Dimer No results for input(s): DDIMER in the last 72 hours. Hgb A1c No results for input(s): HGBA1C in the last 72 hours. Lipid Profile No results for input(s): CHOL, HDL, LDLCALC, TRIG, CHOLHDL, LDLDIRECT in the last 72 hours. Thyroid function studies No results for input(s): TSH, T4TOTAL, T3FREE, THYROIDAB in the last 72 hours.  Invalid input(s): FREET3 Anemia work up No results for input(s): VITAMINB12, FOLATE, FERRITIN, TIBC, IRON, RETICCTPCT in the last 72 hours. Urinalysis     Component Value Date/Time   COLORURINE YELLOW 07/08/2013 1708   APPEARANCEUR CLOUDY 07/08/2013 1708   LABSPEC 1.015 07/08/2013 1708   PHURINE 6.0 07/08/2013 1708   GLUCOSEU NEGATIVE 07/08/2013 1708   HGBUR 2+ 07/08/2013 1708   BILIRUBINUR NEGATIVE 07/08/2013 1708   KETONESUR NEGATIVE 07/08/2013 1708   PROTEINUR TRACE 07/08/2013 1708   NITRITE POSITIVE 07/08/2013 1708   LEUKOCYTESUR 3+ 07/08/2013 1708   Sepsis Labs Invalid input(s): PROCALCITONIN,  WBC,  LACTICIDVEN Microbiology Recent Results (from the past 240 hour(s))  Resp Panel by RT-PCR (Flu A&B, Covid) Nasopharyngeal Swab     Status: None   Collection Time: 12/22/21  7:45 PM   Specimen: Nasopharyngeal Swab; Nasopharyngeal(NP) swabs in vial transport medium  Result Value  Ref Range Status   SARS Coronavirus 2 by RT PCR NEGATIVE NEGATIVE Final    Comment: (NOTE) SARS-CoV-2 target nucleic acids are NOT DETECTED.  The SARS-CoV-2 RNA is generally detectable in upper respiratory specimens during the acute phase of infection. The lowest concentration of SARS-CoV-2 viral copies this assay can detect is 138 copies/mL. A negative result does not preclude SARS-Cov-2 infection and should not be used as the sole basis for treatment or other patient management decisions. A negative result may occur with  improper specimen collection/handling, submission of specimen other than nasopharyngeal swab, presence of viral mutation(s) within the areas targeted by this assay, and inadequate number of viral copies(<138 copies/mL). A negative result must be combined with clinical observations, patient history, and epidemiological information. The expected result is Negative.  Fact Sheet for Patients:  EntrepreneurPulse.com.au  Fact Sheet for Healthcare Providers:  IncredibleEmployment.be  This test is no t yet approved or cleared by the [place] FDA and  has been authorized for detection and/or  diagnosis of SARS-CoV-2 by FDA under an Emergency Use Authorization (EUA). This EUA will remain  in effect (meaning this test can be used) for the duration of the COVID-19 declaration under Section 564(b)(1) of the Act, 21 U.S.C.section 360bbb-3(b)(1), unless the authorization is terminated  or revoked sooner.       Influenza A by PCR NEGATIVE NEGATIVE Final   Influenza B by PCR NEGATIVE NEGATIVE Final    Comment: (NOTE) The Xpert Xpress SARS-CoV-2/FLU/RSV plus assay is intended as an aid in the diagnosis of influenza from Nasopharyngeal swab specimens and should not be used as a sole basis for treatment. Nasal washings and aspirates are unacceptable for Xpert Xpress SARS-CoV-2/FLU/RSV testing.  Fact Sheet for Patients: EntrepreneurPulse.com.au  Fact Sheet for Healthcare Providers: IncredibleEmployment.be  This test is not yet approved or cleared by the [place] FDA and has been authorized for detection and/or diagnosis of SARS-CoV-2 by FDA under an Emergency Use Authorization (EUA). This EUA will remain in effect (meaning this test can be used) for the duration of the COVID-19 declaration under Section 564(b)(1) of the Act, 21 U.S.C. section 360bbb-3(b)(1), unless the authorization is terminated or revoked.  Performed at [facility], [address] [place], [place] [address]      Time coordinating discharge: Over 30 minutes  SIGNED:   Little Ishikawa, DO [facility] 12/28/2021, 1:19 PM Pager   If 7PM-7AM, please contact night-coverage [email or web address]

## 2022-10-19 DIAGNOSIS — Z20828 Contact with and (suspected) exposure to other viral communicable diseases: Secondary | ICD-10-CM | POA: Diagnosis not present

## 2023-02-03 DIAGNOSIS — I272 Pulmonary hypertension, unspecified: Secondary | ICD-10-CM | POA: Diagnosis not present

## 2023-02-03 DIAGNOSIS — E039 Hypothyroidism, unspecified: Secondary | ICD-10-CM | POA: Diagnosis not present

## 2023-02-03 DIAGNOSIS — N183 Chronic kidney disease, stage 3 unspecified: Secondary | ICD-10-CM | POA: Diagnosis not present

## 2023-02-03 DIAGNOSIS — R7309 Other abnormal glucose: Secondary | ICD-10-CM | POA: Diagnosis not present

## 2023-02-03 DIAGNOSIS — T50905A Adverse effect of unspecified drugs, medicaments and biological substances, initial encounter: Secondary | ICD-10-CM | POA: Diagnosis not present

## 2023-02-03 DIAGNOSIS — I10 Essential (primary) hypertension: Secondary | ICD-10-CM | POA: Diagnosis not present

## 2023-02-03 DIAGNOSIS — E875 Hyperkalemia: Secondary | ICD-10-CM | POA: Diagnosis not present

## 2023-02-03 DIAGNOSIS — E782 Mixed hyperlipidemia: Secondary | ICD-10-CM | POA: Diagnosis not present

## 2023-02-14 DIAGNOSIS — E039 Hypothyroidism, unspecified: Secondary | ICD-10-CM | POA: Diagnosis not present

## 2023-02-14 DIAGNOSIS — Z1331 Encounter for screening for depression: Secondary | ICD-10-CM | POA: Diagnosis not present

## 2023-02-14 DIAGNOSIS — R7989 Other specified abnormal findings of blood chemistry: Secondary | ICD-10-CM | POA: Diagnosis not present

## 2023-02-14 DIAGNOSIS — I739 Peripheral vascular disease, unspecified: Secondary | ICD-10-CM | POA: Diagnosis not present

## 2023-02-14 DIAGNOSIS — M8000XD Age-related osteoporosis with current pathological fracture, unspecified site, subsequent encounter for fracture with routine healing: Secondary | ICD-10-CM | POA: Diagnosis not present

## 2023-02-14 DIAGNOSIS — I272 Pulmonary hypertension, unspecified: Secondary | ICD-10-CM | POA: Diagnosis not present

## 2023-02-14 DIAGNOSIS — E782 Mixed hyperlipidemia: Secondary | ICD-10-CM | POA: Diagnosis not present

## 2023-02-14 DIAGNOSIS — I129 Hypertensive chronic kidney disease with stage 1 through stage 4 chronic kidney disease, or unspecified chronic kidney disease: Secondary | ICD-10-CM | POA: Diagnosis not present

## 2023-02-14 DIAGNOSIS — Z0000 Encounter for general adult medical examination without abnormal findings: Secondary | ICD-10-CM | POA: Diagnosis not present

## 2023-02-14 DIAGNOSIS — I493 Ventricular premature depolarization: Secondary | ICD-10-CM | POA: Diagnosis not present

## 2023-02-14 DIAGNOSIS — I340 Nonrheumatic mitral (valve) insufficiency: Secondary | ICD-10-CM | POA: Diagnosis not present

## 2023-06-14 DIAGNOSIS — H353131 Nonexudative age-related macular degeneration, bilateral, early dry stage: Secondary | ICD-10-CM | POA: Diagnosis not present

## 2023-06-14 DIAGNOSIS — H04123 Dry eye syndrome of bilateral lacrimal glands: Secondary | ICD-10-CM | POA: Diagnosis not present

## 2023-08-25 DIAGNOSIS — I340 Nonrheumatic mitral (valve) insufficiency: Secondary | ICD-10-CM | POA: Diagnosis not present

## 2023-08-25 DIAGNOSIS — N183 Chronic kidney disease, stage 3 unspecified: Secondary | ICD-10-CM | POA: Diagnosis not present

## 2023-08-25 DIAGNOSIS — I272 Pulmonary hypertension, unspecified: Secondary | ICD-10-CM | POA: Diagnosis not present

## 2023-08-25 DIAGNOSIS — E782 Mixed hyperlipidemia: Secondary | ICD-10-CM | POA: Diagnosis not present

## 2023-08-25 DIAGNOSIS — E039 Hypothyroidism, unspecified: Secondary | ICD-10-CM | POA: Diagnosis not present

## 2023-08-25 DIAGNOSIS — R7309 Other abnormal glucose: Secondary | ICD-10-CM | POA: Diagnosis not present

## 2023-08-25 DIAGNOSIS — E875 Hyperkalemia: Secondary | ICD-10-CM | POA: Diagnosis not present

## 2023-08-25 DIAGNOSIS — T50905A Adverse effect of unspecified drugs, medicaments and biological substances, initial encounter: Secondary | ICD-10-CM | POA: Diagnosis not present

## 2023-09-04 DIAGNOSIS — M8000XD Age-related osteoporosis with current pathological fracture, unspecified site, subsequent encounter for fracture with routine healing: Secondary | ICD-10-CM | POA: Diagnosis not present

## 2023-09-04 DIAGNOSIS — K219 Gastro-esophageal reflux disease without esophagitis: Secondary | ICD-10-CM | POA: Diagnosis not present

## 2023-09-04 DIAGNOSIS — I1310 Hypertensive heart and chronic kidney disease without heart failure, with stage 1 through stage 4 chronic kidney disease, or unspecified chronic kidney disease: Secondary | ICD-10-CM | POA: Diagnosis not present

## 2023-09-04 DIAGNOSIS — I272 Pulmonary hypertension, unspecified: Secondary | ICD-10-CM | POA: Diagnosis not present

## 2023-09-04 DIAGNOSIS — Z09 Encounter for follow-up examination after completed treatment for conditions other than malignant neoplasm: Secondary | ICD-10-CM | POA: Diagnosis not present

## 2023-09-04 DIAGNOSIS — E782 Mixed hyperlipidemia: Secondary | ICD-10-CM | POA: Diagnosis not present

## 2023-09-04 DIAGNOSIS — R7989 Other specified abnormal findings of blood chemistry: Secondary | ICD-10-CM | POA: Diagnosis not present

## 2023-09-04 DIAGNOSIS — E039 Hypothyroidism, unspecified: Secondary | ICD-10-CM | POA: Diagnosis not present

## 2023-09-04 DIAGNOSIS — E875 Hyperkalemia: Secondary | ICD-10-CM | POA: Diagnosis not present

## 2023-11-23 IMAGING — DX DG FEMUR 2+V PORT*L*
4 series · 4 of 4 positions shown · non-contrast
Comparison: None.

CLINICAL DATA: Status post IM nail placement

EXAM:
LEFT FEMUR PORTABLE 2 VIEWS

[femur lat (1 of 2)]
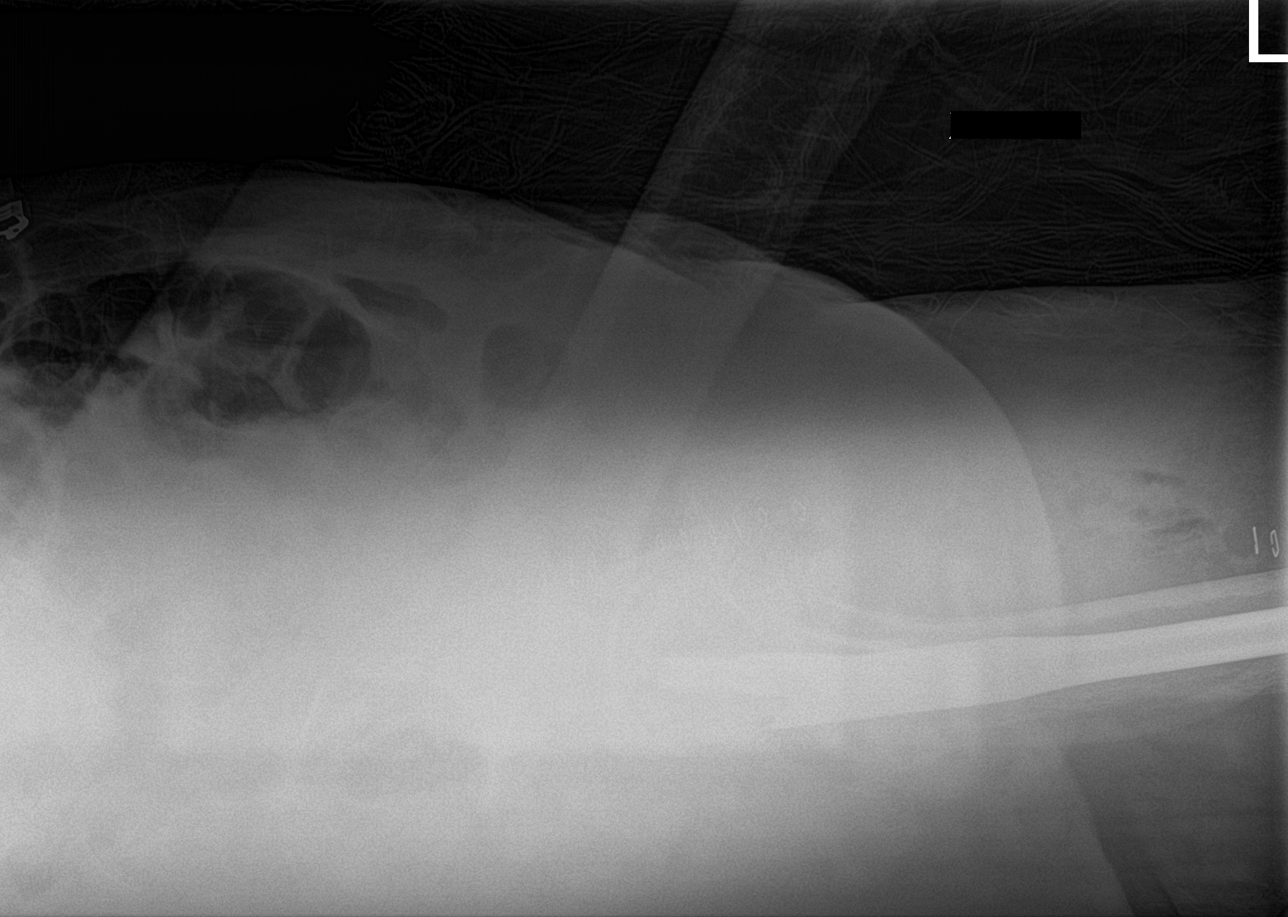

[femur lat (2 of 2)]
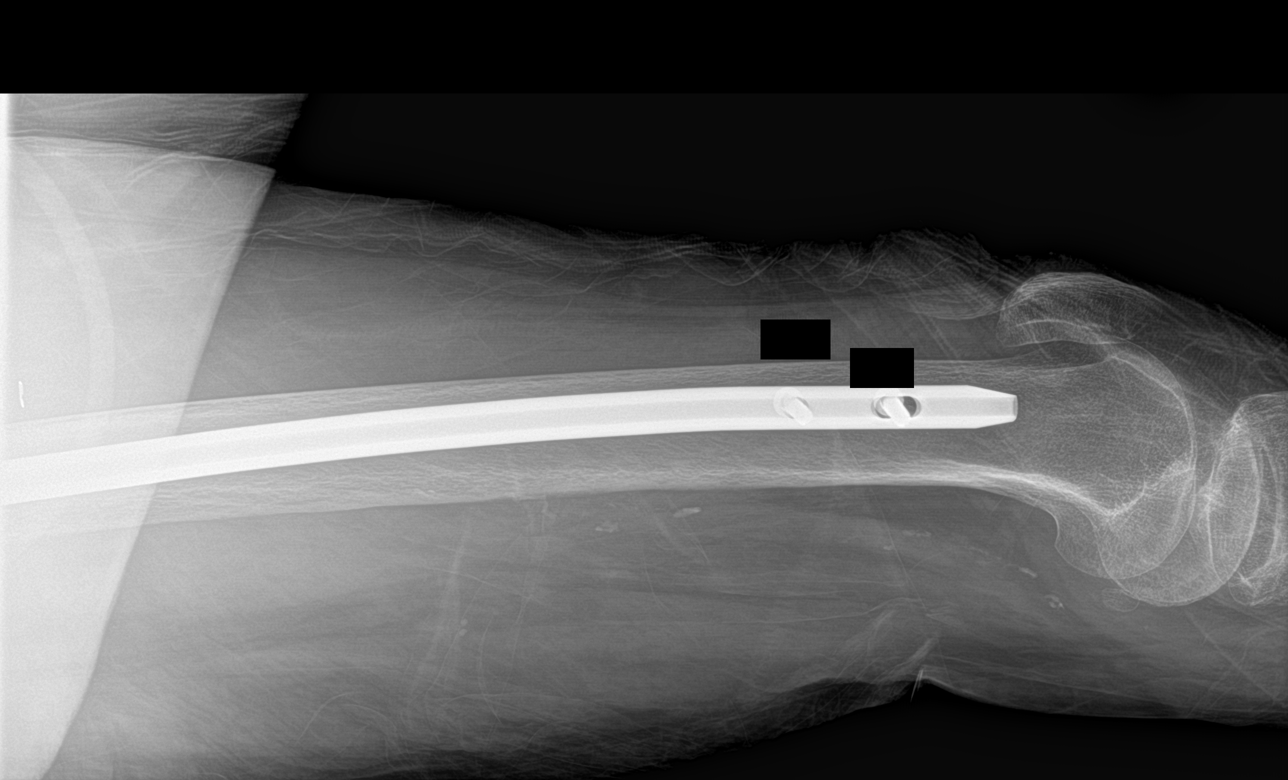

[femur ap (1 of 2)]
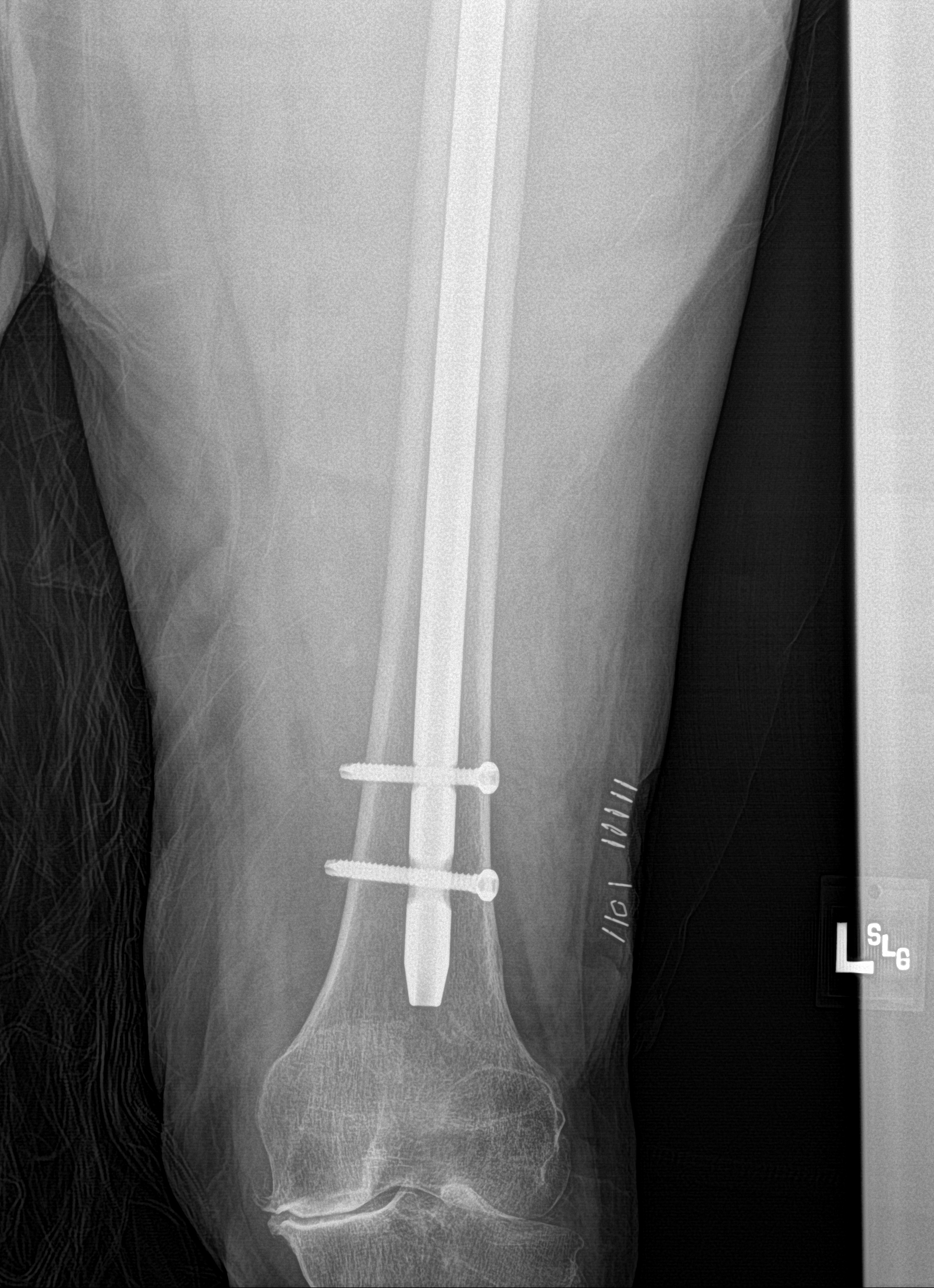

[femur ap (2 of 2)]
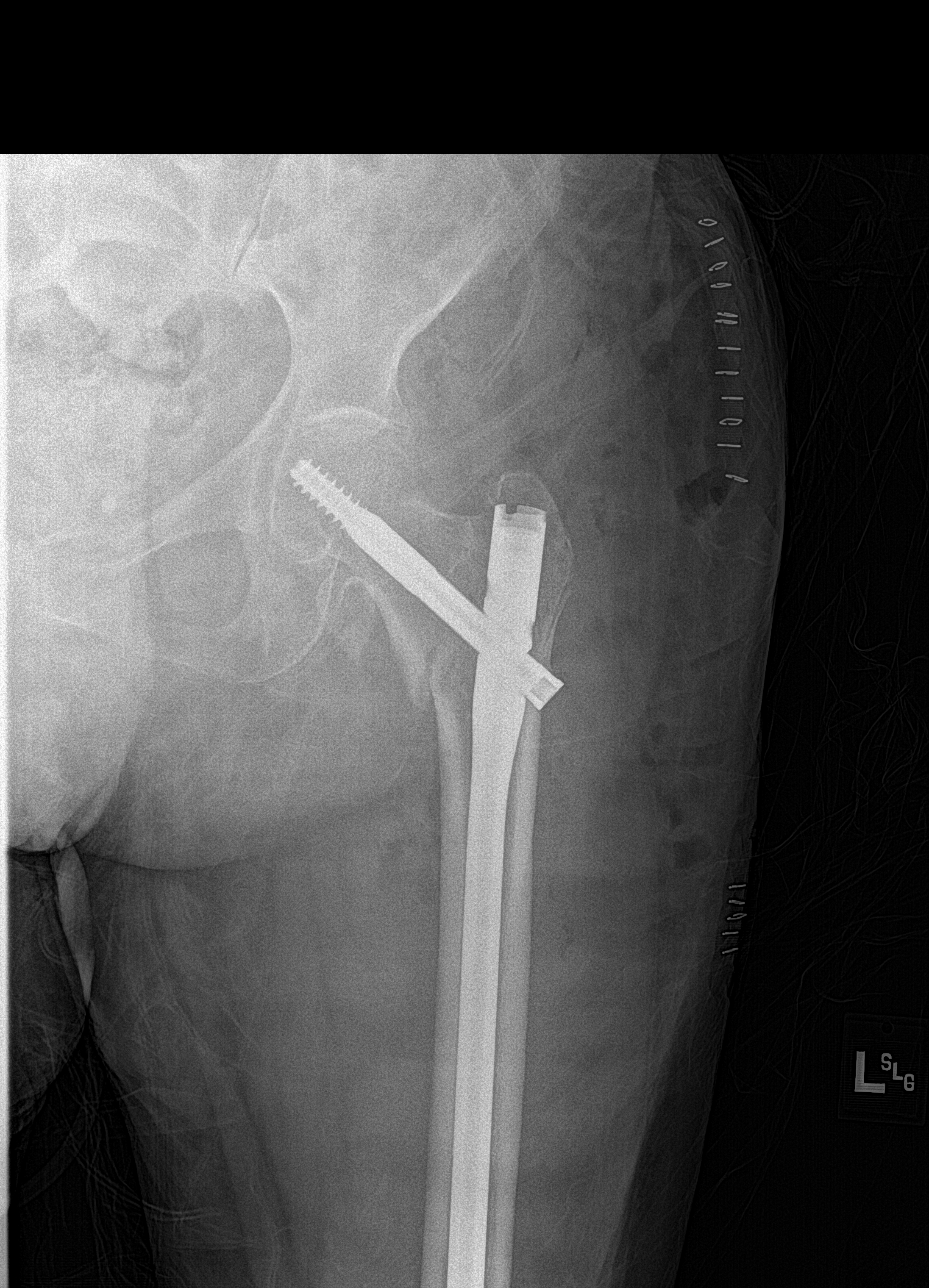

[4 of 4 positions shown; findings below may reference images not displayed]

FINDINGS: Left intertrochanteric fracture transfixed with a intramedullary
nail and interlocking femoral neck screw. No hardware failure or
complication. Near anatomic alignment. Postsurgical changes in the
soft tissues overlying the left hip.

Severe osteoarthritis of the medial and lateral femorotibial
compartments.
IMPRESSION: Interval ORIF of a left intertrochanteric fracture.

## 2024-03-01 DIAGNOSIS — E559 Vitamin D deficiency, unspecified: Secondary | ICD-10-CM | POA: Diagnosis not present

## 2024-03-01 DIAGNOSIS — E878 Other disorders of electrolyte and fluid balance, not elsewhere classified: Secondary | ICD-10-CM | POA: Diagnosis not present

## 2024-03-01 DIAGNOSIS — R7303 Prediabetes: Secondary | ICD-10-CM | POA: Diagnosis not present

## 2024-03-01 DIAGNOSIS — E039 Hypothyroidism, unspecified: Secondary | ICD-10-CM | POA: Diagnosis not present

## 2024-03-01 DIAGNOSIS — R7989 Other specified abnormal findings of blood chemistry: Secondary | ICD-10-CM | POA: Diagnosis not present

## 2024-03-01 DIAGNOSIS — E782 Mixed hyperlipidemia: Secondary | ICD-10-CM | POA: Diagnosis not present

## 2024-03-01 DIAGNOSIS — Z79899 Other long term (current) drug therapy: Secondary | ICD-10-CM | POA: Diagnosis not present

## 2024-03-04 ENCOUNTER — Emergency Department

## 2024-03-04 ENCOUNTER — Other Ambulatory Visit: Payer: Self-pay

## 2024-03-04 ENCOUNTER — Inpatient Hospital Stay
Admission: EM | Admit: 2024-03-04 | Discharge: 2024-03-08 | DRG: 291 | Disposition: A | Discharge status: Home Health | Attending: Internal Medicine | Admitting: Internal Medicine

## 2024-03-04 DIAGNOSIS — N1832 Chronic kidney disease, stage 3b: Secondary | ICD-10-CM | POA: Diagnosis not present

## 2024-03-04 DIAGNOSIS — E039 Hypothyroidism, unspecified: Secondary | ICD-10-CM | POA: Diagnosis not present

## 2024-03-04 DIAGNOSIS — Z79899 Other long term (current) drug therapy: Secondary | ICD-10-CM | POA: Diagnosis not present

## 2024-03-04 DIAGNOSIS — I214 Non-ST elevation (NSTEMI) myocardial infarction: Secondary | ICD-10-CM

## 2024-03-04 DIAGNOSIS — I272 Pulmonary hypertension, unspecified: Secondary | ICD-10-CM | POA: Diagnosis present

## 2024-03-04 DIAGNOSIS — Z886 Allergy status to analgesic agent status: Secondary | ICD-10-CM | POA: Diagnosis not present

## 2024-03-04 DIAGNOSIS — E785 Hyperlipidemia, unspecified: Secondary | ICD-10-CM | POA: Diagnosis present

## 2024-03-04 DIAGNOSIS — Z66 Do not resuscitate: Secondary | ICD-10-CM | POA: Diagnosis present

## 2024-03-04 DIAGNOSIS — I2489 Other forms of acute ischemic heart disease: Secondary | ICD-10-CM

## 2024-03-04 DIAGNOSIS — I10 Essential (primary) hypertension: Secondary | ICD-10-CM | POA: Diagnosis present

## 2024-03-04 DIAGNOSIS — Z79890 Hormone replacement therapy (postmenopausal): Secondary | ICD-10-CM | POA: Diagnosis not present

## 2024-03-04 DIAGNOSIS — I161 Hypertensive emergency: Secondary | ICD-10-CM | POA: Diagnosis present

## 2024-03-04 DIAGNOSIS — I5031 Acute diastolic (congestive) heart failure: Secondary | ICD-10-CM

## 2024-03-04 DIAGNOSIS — Z888 Allergy status to other drugs, medicaments and biological substances status: Secondary | ICD-10-CM

## 2024-03-04 DIAGNOSIS — I517 Cardiomegaly: Secondary | ICD-10-CM | POA: Diagnosis not present

## 2024-03-04 DIAGNOSIS — R54 Age-related physical debility: Secondary | ICD-10-CM | POA: Diagnosis present

## 2024-03-04 DIAGNOSIS — R069 Unspecified abnormalities of breathing: Secondary | ICD-10-CM | POA: Diagnosis not present

## 2024-03-04 DIAGNOSIS — R0602 Shortness of breath: Secondary | ICD-10-CM | POA: Diagnosis not present

## 2024-03-04 DIAGNOSIS — Z604 Social exclusion and rejection: Secondary | ICD-10-CM | POA: Diagnosis present

## 2024-03-04 DIAGNOSIS — I130 Hypertensive heart and chronic kidney disease with heart failure and stage 1 through stage 4 chronic kidney disease, or unspecified chronic kidney disease: Secondary | ICD-10-CM | POA: Diagnosis not present

## 2024-03-04 DIAGNOSIS — I493 Ventricular premature depolarization: Secondary | ICD-10-CM | POA: Diagnosis present

## 2024-03-04 DIAGNOSIS — R5381 Other malaise: Secondary | ICD-10-CM

## 2024-03-04 DIAGNOSIS — I2510 Atherosclerotic heart disease of native coronary artery without angina pectoris: Secondary | ICD-10-CM | POA: Diagnosis present

## 2024-03-04 DIAGNOSIS — I110 Hypertensive heart disease with heart failure: Secondary | ICD-10-CM | POA: Diagnosis not present

## 2024-03-04 DIAGNOSIS — I5033 Acute on chronic diastolic (congestive) heart failure: Secondary | ICD-10-CM

## 2024-03-04 DIAGNOSIS — J90 Pleural effusion, not elsewhere classified: Secondary | ICD-10-CM | POA: Diagnosis not present

## 2024-03-04 DIAGNOSIS — I509 Heart failure, unspecified: Secondary | ICD-10-CM

## 2024-03-04 DIAGNOSIS — I160 Hypertensive urgency: Secondary | ICD-10-CM | POA: Diagnosis not present

## 2024-03-04 DIAGNOSIS — Z90710 Acquired absence of both cervix and uterus: Secondary | ICD-10-CM | POA: Diagnosis not present

## 2024-03-04 DIAGNOSIS — Z1152 Encounter for screening for COVID-19: Secondary | ICD-10-CM

## 2024-03-04 DIAGNOSIS — I700 Atherosclerosis of aorta: Secondary | ICD-10-CM | POA: Diagnosis not present

## 2024-03-04 DIAGNOSIS — N183 Chronic kidney disease, stage 3 unspecified: Secondary | ICD-10-CM

## 2024-03-04 DIAGNOSIS — R918 Other nonspecific abnormal finding of lung field: Secondary | ICD-10-CM | POA: Diagnosis not present

## 2024-03-04 DIAGNOSIS — J811 Chronic pulmonary edema: Secondary | ICD-10-CM | POA: Diagnosis not present

## 2024-03-04 LAB — BASIC METABOLIC PANEL
Anion gap: 14 (ref 5–15)
BUN: 37 mg/dL — ABNORMAL HIGH (ref 8–23)
CO2: 19 mmol/L — ABNORMAL LOW (ref 22–32)
Calcium: 9.3 mg/dL (ref 8.9–10.3)
Chloride: 108 mmol/L (ref 98–111)
Creatinine, Ser: 1.46 mg/dL — ABNORMAL HIGH (ref 0.44–1.00)
GFR, Estimated: 33 mL/min — ABNORMAL LOW (ref >60)
Glucose, Bld: 114 mg/dL — ABNORMAL HIGH (ref 70–99)
Potassium: 4.6 mmol/L (ref 3.5–5.1)
Sodium: 141 mmol/L (ref 135–145)

## 2024-03-04 LAB — CBC
HCT: 35.5 % — ABNORMAL LOW (ref 36.0–46.0)
Hemoglobin: 11.8 g/dL — ABNORMAL LOW (ref 12.0–15.0)
MCH: 30.6 pg (ref 26.0–34.0)
MCHC: 33.2 g/dL (ref 30.0–36.0)
MCV: 92.2 fL (ref 80.0–100.0)
Platelets: 228 10*3/uL (ref 150–400)
RBC: 3.85 MIL/uL — ABNORMAL LOW (ref 3.87–5.11)
RDW: 13 % (ref 11.5–15.5)
WBC: 7.7 10*3/uL (ref 4.0–10.5)
nRBC: 0 % (ref 0.0–0.2)

## 2024-03-04 LAB — PROTIME-INR
INR: 1.1 (ref 0.8–1.2)
Prothrombin Time: 13.9 s (ref 11.4–15.2)

## 2024-03-04 LAB — HEPARIN LEVEL (UNFRACTIONATED): Heparin Unfractionated: 0.15 [IU]/mL — ABNORMAL LOW (ref 0.30–0.70)

## 2024-03-04 LAB — TSH: TSH: 4.862 u[IU]/mL — ABNORMAL HIGH (ref 0.350–4.500)

## 2024-03-04 LAB — TROPONIN I (HIGH SENSITIVITY)
Troponin I (High Sensitivity): 96 ng/L — ABNORMAL HIGH (ref <18)
Troponin I (High Sensitivity): 244 ng/L (ref <18)
Troponin I (High Sensitivity): 283 ng/L (ref <18)

## 2024-03-04 LAB — APTT: aPTT: 30 s (ref 24–36)

## 2024-03-04 LAB — BRAIN NATRIURETIC PEPTIDE: B Natriuretic Peptide: 1003.2 pg/mL — ABNORMAL HIGH (ref 0.0–100.0)

## 2024-03-04 MED ORDER — CYCLOSPORINE 0.05 % OP EMUL
1.0000 [drp] | Freq: Two times a day (BID) | OPHTHALMIC | Status: DC
  Administered 2024-03-04 – 2024-03-08 (×7): 1 [drp] via OPHTHALMIC
  Filled 2024-03-04 (×9): qty 30

## 2024-03-04 MED ORDER — LEVOTHYROXINE SODIUM 25 MCG PO TABS
25.0000 ug | ORAL_TABLET | Freq: Every day | ORAL | Status: DC
  Administered 2024-03-04 – 2024-03-08 (×5): 25 ug via ORAL
  Filled 2024-03-04 (×5): qty 1

## 2024-03-04 MED ORDER — POLYETHYLENE GLYCOL 3350 17 G PO PACK: 17.0000 g | PACK | Freq: Every day | ORAL | Status: DC | PRN

## 2024-03-04 MED ORDER — NICARDIPINE HCL IN NACL 20-0.86 MG/200ML-% IV SOLN
3.0000 mg/h | INTRAVENOUS | Status: DC
  Administered 2024-03-04: 5 mg/h via INTRAVENOUS
  Administered 2024-03-04 (×2): 10 mg/h via INTRAVENOUS
  Filled 2024-03-04 (×3): qty 200

## 2024-03-04 MED ORDER — ASPIRIN 81 MG PO CHEW
324.0000 mg | CHEWABLE_TABLET | Freq: Once | ORAL | Status: AC
  Administered 2024-03-04: 324 mg via ORAL
  Filled 2024-03-04: qty 4

## 2024-03-04 MED ORDER — HEPARIN BOLUS VIA INFUSION
1900.0000 [IU] | Freq: Once | INTRAVENOUS | Status: AC
  Administered 2024-03-04: 1900 [IU] via INTRAVENOUS
  Filled 2024-03-04: qty 1900

## 2024-03-04 MED ORDER — HYDRALAZINE HCL 20 MG/ML IJ SOLN
5.0000 mg | Freq: Four times a day (QID) | INTRAMUSCULAR | Status: DC | PRN
  Administered 2024-03-05 (×2): 5 mg via INTRAVENOUS
  Filled 2024-03-04 (×2): qty 1

## 2024-03-04 MED ORDER — HYDRALAZINE HCL 20 MG/ML IJ SOLN
5.0000 mg | Freq: Once | INTRAMUSCULAR | Status: AC
  Administered 2024-03-04: 5 mg via INTRAVENOUS

## 2024-03-04 MED ORDER — HEPARIN BOLUS VIA INFUSION
3800.0000 [IU] | Freq: Once | INTRAVENOUS | Status: AC
  Administered 2024-03-04: 3800 [IU] via INTRAVENOUS
  Filled 2024-03-04: qty 3800

## 2024-03-04 MED ORDER — CARVEDILOL 6.25 MG PO TABS
6.2500 mg | ORAL_TABLET | Freq: Two times a day (BID) | ORAL | Status: DC
  Administered 2024-03-04 – 2024-03-05 (×4): 6.25 mg via ORAL
  Filled 2024-03-04 (×6): qty 1

## 2024-03-04 MED ORDER — ACETAMINOPHEN 325 MG PO TABS
325.0000 mg | ORAL_TABLET | Freq: Four times a day (QID) | ORAL | Status: DC | PRN
  Administered 2024-03-07 (×2): 650 mg via ORAL
  Filled 2024-03-04 (×3): qty 2

## 2024-03-04 MED ORDER — HEPARIN (PORCINE) 25000 UT/250ML-% IV SOLN
950.0000 [IU]/h | INTRAVENOUS | Status: DC
  Administered 2024-03-04: 750 [IU]/h via INTRAVENOUS
  Administered 2024-03-05: 950 [IU]/h via INTRAVENOUS
  Filled 2024-03-04 (×2): qty 250

## 2024-03-04 MED ORDER — FESOTERODINE FUMARATE ER 4 MG PO TB24
4.0000 mg | ORAL_TABLET | Freq: Every day | ORAL | Status: DC
  Administered 2024-03-04 – 2024-03-07 (×4): 4 mg via ORAL
  Filled 2024-03-04 (×5): qty 1

## 2024-03-04 MED ORDER — HYDRALAZINE HCL 50 MG PO TABS
25.0000 mg | ORAL_TABLET | Freq: Three times a day (TID) | ORAL | Status: DC
  Administered 2024-03-04: 25 mg via ORAL
  Filled 2024-03-04: qty 1

## 2024-03-04 MED ORDER — HYDRALAZINE HCL 20 MG/ML IJ SOLN
5.0000 mg | Freq: Once | INTRAMUSCULAR | Status: AC
  Administered 2024-03-04: 5 mg via INTRAVENOUS
  Filled 2024-03-04: qty 1

## 2024-03-04 MED ORDER — PANTOPRAZOLE SODIUM 40 MG PO TBEC
40.0000 mg | DELAYED_RELEASE_TABLET | Freq: Every day | ORAL | Status: DC
  Administered 2024-03-04 – 2024-03-08 (×5): 40 mg via ORAL
  Filled 2024-03-04 (×5): qty 1

## 2024-03-04 MED ORDER — FUROSEMIDE 10 MG/ML IJ SOLN
40.0000 mg | Freq: Two times a day (BID) | INTRAMUSCULAR | Status: DC
  Administered 2024-03-04 – 2024-03-05 (×2): 40 mg via INTRAVENOUS
  Filled 2024-03-04 (×2): qty 4

## 2024-03-04 MED ORDER — HYDRALAZINE HCL 50 MG PO TABS
25.0000 mg | ORAL_TABLET | Freq: Three times a day (TID) | ORAL | Status: DC
  Administered 2024-03-04 – 2024-03-05 (×2): 25 mg via ORAL
  Filled 2024-03-04 (×2): qty 1

## 2024-03-04 MED ORDER — FUROSEMIDE 10 MG/ML IJ SOLN
40.0000 mg | Freq: Once | INTRAMUSCULAR | Status: AC
  Administered 2024-03-04: 40 mg via INTRAVENOUS
  Filled 2024-03-04: qty 4

## 2024-03-04 MED ORDER — ISOSORBIDE MONONITRATE ER 60 MG PO TB24
30.0000 mg | ORAL_TABLET | Freq: Every day | ORAL | Status: DC
  Administered 2024-03-04: 30 mg via ORAL
  Filled 2024-03-04: qty 1

## 2024-03-04 MED ORDER — ONDANSETRON HCL 4 MG/2ML IJ SOLN: 4.0000 mg | Freq: Four times a day (QID) | INTRAMUSCULAR | Status: DC | PRN

## 2024-03-04 MED ORDER — ONDANSETRON HCL 4 MG PO TABS: 4.0000 mg | ORAL_TABLET | Freq: Four times a day (QID) | ORAL | Status: DC | PRN

## 2024-03-04 NOTE — H&P (Addendum)
 History and Physical    Daisy Mora:[id number] DOB: 20-Nov-1926 DOA: 03/04/2024  PCP: Luciana Axe, NP (Confirm with patient/family/NH records and if not entered, this has to be entered at [facility] point of entry) Patient coming from: Home  I have personally briefly reviewed patient's old medical records in [facility]  Chief Complaint: SOB, leg swelling  HPI: Daisy Mora is a 89 y.o. female with medical history significant of chronic HFpEF, moderate MR, moderate pulmonary hypertension, refractory HTN, CKD stage IIIb, hypothyroidism, presented with worsening of shortness of breath and leg swelling.  Symptoms started yesterday, patient started develop exertional dyspnea and orthopnea at night.  Denied any cough but does feel " congested in the lungs" denied any chest pain, no fever or chills.  No abdominal pain no nauseous vomiting or diarrhea.  Patient used to follow-up with Dr. Crissie Sickles until 2 years ago.  She lives by herself and uses a cane to ambulate but denied any exertional dyspnea before yesterday.  ED Course: Afebrile, blood pressure 220/90, O2 saturation 95% on room air.  Chest x-ray showed pulmonary Congestion and cardiomegaly, blood work showed hemoglobin 11.8, WBC 7.7, creatinine 1.4 compared to baseline 1.3-1.4, bicarb 19K 4.6.  Troponin trending 96> 244.  EKG showed frequent PVCs, sinus rhythm no acute ST changes.  Patient was given IV hydralazine x 2, IV Lasix 40 mg x 1 and started on heparin drip in ED.  Review of Systems: As per HPI otherwise 14 point review of systems negative.    Past Medical History:  Diagnosis Date   Hypertension     Past Surgical History:  Procedure Laterality Date   ABDOMINAL HYSTERECTOMY     APPENDECTOMY     BREAST SURGERY     INTRAMEDULLARY (IM) NAIL INTERTROCHANTERIC Left 12/23/2021   Procedure: INTRAMEDULLARY (IM) NAIL INTERTROCHANTRIC;  Surgeon: Juanell Fairly, MD;  Location: [facility] ORS;  Service: Orthopedics;   Laterality: Left;     reports that she has never smoked. She has never used smokeless tobacco. She reports that she does not drink alcohol and does not use drugs.  Allergies  Allergen Reactions   Aspirin Tinitus   Losartan Potassium-Hctz Other (See Comments)    History reviewed. No pertinent family history.   Prior to Admission medications   Medication Sig Start Date End Date Taking? Authorizing Provider  acetaminophen (TYLENOL) 325 MG tablet Take 1-2 tablets (325-650 mg total) by mouth every 6 (six) hours as needed for mild pain (pain score 1-3 or temp > 100.5). 12/28/21  Yes Azucena Fallen, MD  amLODipine (NORVASC) 10 MG tablet Take 10 mg by mouth daily.   Yes [provider]  carvedilol (COREG) 25 MG tablet Take 25 mg by mouth 2 (two) times daily with a meal.   Yes [provider]  Cholecalciferol 50 MCG (2000 UT) CAPS Take 1 capsule by mouth daily. 02/14/23  Yes [provider]  cloNIDine (CATAPRES) 0.1 MG tablet Take 0.1 mg by mouth 2 (two) times daily.   Yes [provider]  cyanocobalamin 500 MCG tablet Take 500 mcg by mouth daily.   Yes [provider]  levothyroxine (SYNTHROID) 25 MCG tablet Take 25 mcg by mouth daily before breakfast. 02/12/24  Yes [provider]  lisinopril (ZESTRIL) 40 MG tablet Take 40 mg by mouth daily.   Yes [provider]  Multiple Vitamin (MULTIVITAMIN) capsule Take 1 capsule by mouth daily.   Yes [provider]  omeprazole (PRILOSEC OTC) 20 MG tablet  Take 20 mg by mouth daily.   Yes [provider]  polyethylene glycol (MIRALAX / GLYCOLAX) 17 g packet Take 17 g by mouth daily as needed for mild constipation. 12/28/21  Yes Azucena Fallen, MD  RESTASIS 0.05 % ophthalmic emulsion Place 1 drop into both eyes 2 (two) times daily. 05/31/21  Yes [provider]  solifenacin (VESICARE) 5 MG tablet Take 5 mg by mouth daily. 01/26/24  Yes [provider]   Turmeric 400 MG CAPS Take by mouth.   Yes [provider]  Omega-3 Fatty Acids (FISH OIL PO) Take by mouth daily. Patient not taking: Reported on 03/04/2024    [provider]    Physical Exam: Vitals:   03/04/24 0700 03/04/24 0715 03/04/24 0730 03/04/24 0745  BP: (!) 206/80 (!) 192/75 (!) 206/70 (!) 203/70  Pulse:      Resp: 17 18 16 15   Temp:      TempSrc:      SpO2: 95% 95% 95% 94%  Weight:      Height:        Constitutional: NAD, calm, comfortable Vitals:   03/04/24 0700 03/04/24 0715 03/04/24 0730 03/04/24 0745  BP: (!) 206/80 (!) 192/75 (!) 206/70 (!) 203/70  Pulse:      Resp: 17 18 16 15   Temp:      TempSrc:      SpO2: 95% 95% 95% 94%  Weight:      Height:       Eyes: PERRL, lids and conjunctivae normal ENMT: Mucous membranes are moist. Posterior pharynx clear of any exudate or lesions.Normal dentition.  Neck: normal, supple, no masses, no thyromegaly Respiratory: clear to auscultation bilaterally, no wheezing, fine crackles on bilateral lower fields increasing respiratory effort. No accessory muscle use.  Cardiovascular: Regular rate and rhythm, no murmurs / rubs / gallops. 2+ extremity edema. 2+ pedal pulses. No carotid bruits.  Abdomen: no tenderness, no masses palpated. No hepatosplenomegaly. Bowel sounds positive.  Musculoskeletal: no clubbing / cyanosis. No joint deformity upper and lower extremities. Good ROM, no contractures. Normal muscle tone.  Skin: no rashes, lesions, ulcers. No induration Neurologic: CN 2-12 grossly intact. Sensation intact, DTR normal. Strength 5/5 in all 4.  Psychiatric: Normal judgment and insight. Alert and oriented x 3. Normal mood.     Labs on Admission: I have personally reviewed following labs and imaging studies  CBC: Recent Labs  Lab 03/04/24 0409  WBC 7.7  HGB 11.8*  HCT 35.5*  MCV 92.2  PLT 228   Basic Metabolic Panel: Recent Labs  Lab 03/04/24 0409  NA 141  K 4.6  CL 108  CO2 19*   GLUCOSE 114*  BUN 37*  CREATININE 1.46*  CALCIUM 9.3   GFR: Estimated Creatinine Clearance: 20.2 mL/min (A) (by C-G formula based on SCr of 1.46 mg/dL (H)). Liver Function Tests: No results for input(s): "AST", "ALT", "ALKPHOS", "BILITOT", "PROT", "ALBUMIN" in the last 168 hours. No results for input(s): "LIPASE", "AMYLASE" in the last 168 hours. No results for input(s): "AMMONIA" in the last 168 hours. Coagulation Profile: No results for input(s): "INR", "PROTIME" in the last 168 hours. Cardiac Enzymes: No results for input(s): "CKTOTAL", "CKMB", "CKMBINDEX", "TROPONINI" in the last 168 hours. BNP (last 3 results) No results for input(s): "PROBNP" in the last 8760 hours. HbA1C: No results for input(s): "HGBA1C" in the last 72 hours. CBG: No results for input(s): "GLUCAP" in the last 168 hours. Lipid Profile: No results for input(s): "CHOL", "HDL", "LDLCALC", "TRIG", "  CHOLHDL", "LDLDIRECT" in the last 72 hours. Thyroid Function Tests: No results for input(s): "TSH", "T4TOTAL", "FREET4", "T3FREE", "THYROIDAB" in the last 72 hours. Anemia Panel: No results for input(s): "VITAMINB12", "FOLATE", "FERRITIN", "TIBC", "IRON", "RETICCTPCT" in the last 72 hours. Urine analysis:    Component Value Date/Time   COLORURINE YELLOW 07/08/2013 1708   APPEARANCEUR CLOUDY 07/08/2013 1708   LABSPEC 1.015 07/08/2013 1708   PHURINE 6.0 07/08/2013 1708   GLUCOSEU NEGATIVE 07/08/2013 1708   HGBUR 2+ 07/08/2013 1708   BILIRUBINUR NEGATIVE 07/08/2013 1708   KETONESUR NEGATIVE 07/08/2013 1708   PROTEINUR TRACE 07/08/2013 1708   NITRITE POSITIVE 07/08/2013 1708   LEUKOCYTESUR 3+ 07/08/2013 1708    Radiological Exams on Admission: DG Chest Port 1 View Result Date: 03/04/2024 CLINICAL DATA:  161096 with shortness of breath onset yesterday. EXAM: PORTABLE CHEST 1 VIEW COMPARISON:  Portable chest 12/22/2021 FINDINGS: The patient is rotated to the right exaggerating the mediastinum. The aorta is  tortuous and calcified. The heart is moderately enlarged. The mitral ring is heavily calcified. There is perihilar vascular congestion, mild generalized interstitial consolidation consistent with edema and small pleural effusions. Findings consistent with CHF or fluid overload. Scattered bilateral perihilar opacities are also noted, could be due to ground-glass edema or pneumonia. No other focal infiltrate is seen. There is slight thickening in the right horizontal fissure most likely due to fluid in the fissure. Additional right perihilar linear scar-like opacity. There is levoscoliosis and degenerative change of the spine with osteopenia. No new osseous findings. IMPRESSION: 1. Cardiomegaly with perihilar vascular congestion, mild generalized interstitial consolidation consistent with edema and small pleural effusions. Findings consistent with CHF or fluid overload. 2. Scattered bilateral perihilar opacities could be due to ground-glass edema or pneumonia. 3. Aortic atherosclerosis. 4. Osteopenia and degenerative change. Electronically Signed   By: Almira Bar M.D.   On: 03/04/2024 04:26    EKG: Independently reviewed.  Sinus rhythm, no acute ST changes, frequent PVCs.  Assessment/Plan Principal Problem:   Acute CHF (congestive heart failure) (HCC) Active Problems:   CHF (congestive heart failure) (HCC)  (please populate well all problems here in Problem List. (For example, if patient is on BP meds at home and you resume or decide to hold them, it is a problem that needs to be her. Same for CAD, COPD, HLD and so on)  Acute on chronic HFpEF decompensation Chronic MR Chronic moderate pulmonary hypertension -Aggressive IV diuresis IV 40 mg Lasix twice daily -Monitor kidney function -Need aggressive BP management.  Resume Coreg, start Imdur plus hydralazine regimen  NSTEMI -Versus demanding ischemia, as patient does not have active chest pain and EKG showed nonischemic changes. -Discussed with  on-call [facility] cardiology, recommended conservative management, no plan for LHC  HTN emergency -D/C cardene drip -Coreg -Imdur plus hydralazine -As needed hydralazine -Discontinue amlodipine and clonidine due to patient is in active CHF  CKD stage IIIb -Significant fluid overload, creatinine level stable -IV diuresis as above  Deconditioning -PT evaluation  CODE STATUS -Patient expressed that she does not wish to have heroic measures in the event of cardiac arrest.  Agreed with DNR/DNI.  DVT prophylaxis: Heparin subcu Code Status: DNR Family Communication: None at bedside Disposition Plan: Patient is sick with acute CHF decompensation with a baseline CKD requiring IV diuresis and closely monitoring kidney function changes, expect more than 2 midnight hospital stay Consults called: [facility] cardiac Admission status: PCU admit   Emeline General MD [facility] Pager [phone]  03/04/2024, 8:37 AM

## 2024-03-04 NOTE — ED Notes (Signed)
 Report received from Amy, RN. Primary nursing care assumed at this time.

## 2024-03-04 NOTE — Consult Note (Signed)
 [facility] CLINIC CARDIOLOGY CONSULT NOTE       Patient ID: Daisy Mora MRN: [id number] DOB/AGE: 1926-08-27 89 y.o.  Admit date: 03/04/2024 Referring Physician Dr Chipper Herb Primary Physician Luciana Axe, NP Primary Cardiologist Gwen Pounds (retired) Reason for Consultation elevated trops  HPI: Daisy Mora is a 89 y.o. female with medical history significant of chronic HFpEF, moderate MR, moderate pulmonary hypertension, refractory HTN, CKD stage IIIb, hypothyroidism, presented with worsening of shortness of breath and leg swelling.   Patient also noted to have significant orthopnea and has not been able to lay flat which prompted her to come to the ER.  Her blood pressure was found to be 220/90 and she is currently on a nicardipine drip which seems to be working quite well.  Patient denies chest pain, palpitations, diaphoresis, syncope.  Review of systems complete and found to be negative unless listed above     Past Medical History:  Diagnosis Date   Hypertension     Past Surgical History:  Procedure Laterality Date   ABDOMINAL HYSTERECTOMY     APPENDECTOMY     BREAST SURGERY     INTRAMEDULLARY (IM) NAIL INTERTROCHANTERIC Left 12/23/2021   Procedure: INTRAMEDULLARY (IM) NAIL INTERTROCHANTRIC;  Surgeon: Juanell Fairly, MD;  Location: [facility] ORS;  Service: Orthopedics;  Laterality: Left;    (Not in a hospital admission)  Social History   Socioeconomic History   Marital status: Widowed    Spouse name: Not on file   Number of children: Not on file   Years of education: Not on file   Highest education level: Not on file  Occupational History   Not on file  Tobacco Use   Smoking status: Never   Smokeless tobacco: Never  Vaping Use   Vaping status: Never Used  Substance and Sexual Activity   Alcohol use: No   Drug use: No   Sexual activity: Not Currently  Other Topics Concern   Not on file  Social History Narrative   Not on file   Social Drivers of Health    Financial Resource Strain: Low Risk  (06/12/2019)   Received from [facility]   Overall Financial Resource Strain (CARDIA)    Difficulty of Paying Living Expenses: Not hard at all  Food Insecurity: No Food Insecurity (06/12/2019)   Received from [facility]   Hunger Vital Sign    Worried About Running Out of Food in the Last Year: Never true    Ran Out of Food in the Last Year: Never true  Transportation Needs: No Transportation Needs (06/12/2019)   Received from [facility]   PRAPARE - Transportation    Lack of Transportation (Medical): No    Lack of Transportation (Non-Medical): No  Physical Activity: Sufficiently Active (06/12/2019)   Received from [facility]   Exercise Vital Sign    Days of Exercise per Week: 6 days    Minutes of Exercise per Session: 30 min  Stress: Not on file  Social Connections: Moderately Integrated (06/12/2019)   Received from [facility]   Social Connection and Isolation Panel [NHANES]    Frequency of Communication with Friends and Family: More than three times a week    Frequency of Social Gatherings with Friends and Family: More than three times a week    Attends Religious Services: More than 4 times per year    Active Member of [employer] or Organizations: Yes    Attends Banker  Meetings: More than 4 times per year    Marital Status: Widowed  Intimate Partner Violence: Not on file    History reviewed. No pertinent family history.   Vitals:   03/04/24 0815 03/04/24 0835 03/04/24 0840 03/04/24 0845  BP: (!) 203/91 (!) 186/60 (!) 186/58 (!) 180/52  Pulse:      Resp: (!) 24 19 (!) 30 (!) 22  Temp:      TempSrc:      SpO2: 95% 96% 93% 93%  Weight:      Height:        PHYSICAL EXAM General: awake, well nourished, in no acute distress. HEENT: Normocephalic and atraumatic. Neck: No JVD.  Lungs: Normal respiratory effort. Decreased breath sounds at  bases. No wheezes, crackles, rhonchi.  Heart: HRRR. Normal S1 and S2 without gallops or murmurs.  Abdomen: Non-distended appearing.  Msk: Normal strength and tone for age. Extremities: Warm and well perfused. No clubbing, cyanosis. 1-2+ edema.  Neuro: Alert and oriented X 3. Psych: Answers questions appropriately.   Labs: Basic Metabolic Panel: Recent Labs    03/04/24 0409  NA 141  K 4.6  CL 108  CO2 19*  GLUCOSE 114*  BUN 37*  CREATININE 1.46*  CALCIUM 9.3   Liver Function Tests: No results for input(s): "AST", "ALT", "ALKPHOS", "BILITOT", "PROT", "ALBUMIN" in the last 72 hours. No results for input(s): "LIPASE", "AMYLASE" in the last 72 hours. CBC: Recent Labs    03/04/24 0409  WBC 7.7  HGB 11.8*  HCT 35.5*  MCV 92.2  PLT 228   Cardiac Enzymes: Recent Labs    03/04/24 0409 03/04/24 0553  TROPONINIHS 96* 244*   BNP: Recent Labs    03/04/24 0409  BNP 1,003.2*   D-Dimer: No results for input(s): "DDIMER" in the last 72 hours. Hemoglobin A1C: No results for input(s): "HGBA1C" in the last 72 hours. Fasting Lipid Panel: No results for input(s): "CHOL", "HDL", "LDLCALC", "TRIG", "CHOLHDL", "LDLDIRECT" in the last 72 hours. Thyroid Function Tests: No results for input(s): "TSH", "T4TOTAL", "T3FREE", "THYROIDAB" in the last 72 hours.  Invalid input(s): "FREET3" Anemia Panel: No results for input(s): "VITAMINB12", "FOLATE", "FERRITIN", "TIBC", "IRON", "RETICCTPCT" in the last 72 hours.   Radiology: [facility] Chest Port 1 View Result Date: 03/04/2024 CLINICAL DATA:  [id number] with shortness of breath onset yesterday. EXAM: PORTABLE CHEST 1 VIEW COMPARISON:  Portable chest 12/22/2021 FINDINGS: The patient is rotated to the right exaggerating the mediastinum. The aorta is tortuous and calcified. The heart is moderately enlarged. The mitral ring is heavily calcified. There is perihilar vascular congestion, mild generalized interstitial consolidation consistent with edema and  small pleural effusions. Findings consistent with CHF or fluid overload. Scattered bilateral perihilar opacities are also noted, could be due to ground-glass edema or pneumonia. No other focal infiltrate is seen. There is slight thickening in the right horizontal fissure most likely due to fluid in the fissure. Additional right perihilar linear scar-like opacity. There is levoscoliosis and degenerative change of the spine with osteopenia. No new osseous findings. IMPRESSION: 1. Cardiomegaly with perihilar vascular congestion, mild generalized interstitial consolidation consistent with edema and small pleural effusions. Findings consistent with CHF or fluid overload. 2. Scattered bilateral perihilar opacities could be due to ground-glass edema or pneumonia. 3. Aortic atherosclerosis. 4. Osteopenia and degenerative change. Electronically Signed   By: Almira Bar M.D.   On: 03/04/2024 04:26    ECHO pending  TELEMETRY reviewed by me [facility]) 03/04/2024 : NSR  EKG reviewed by me: NSR with  PVCs  Data reviewed by me [facility]) 03/04/2024: last 24h vitals tele labs imaging I/O provider notes  Principal Problem:   Acute CHF (congestive heart failure) (HCC) Active Problems:   CHF (congestive heart failure) (HCC)    ASSESSMENT AND PLAN:  Acute on chronic HFpEF decompensation Chronic MR Chronic moderate pulmonary hypertension -Aggressive IV diuresis IV 40 mg Lasix twice daily -Monitor kidney function -continue coreg, hydralazine, imdur. Ok to restart home amlodipine   Demand ischemia -recommend conservative management -no plan for invasive cardiac work-up -no need to further trend trops -maintain on telemetry -suspect trops elevated due to HTN emergency   HTN emergency -restart PO home BP meds -continue nicardipine gtt     Signed: Clotilde Dieter, DO 03/04/2024, 8:56 AM [facility] Cardiology

## 2024-03-04 NOTE — ED Triage Notes (Signed)
 Pt arrived from home BIB [facility], called out for SOB that started yesterday as well as a cough, lungs clear bilaterally, sats 98% RA. Pt denies nasal congestions or other cold symptoms, no hx of respiratory issues. BP initially 241/113 per EMS, pt has hx of HTN, on 3 different BP medications. Denies headache or CP. Pt is A&O at baseline. Pt reports no other aliments at this time.    EMS vitals  229/99 96 cbg 95% RA 85 HR

## 2024-03-04 NOTE — ED Provider Notes (Addendum)
 [facility] Provider Note    Event Date/Time   First MD Initiated Contact with Patient 03/04/24 [phone]     (approximate)   History   Hypertension   HPI  Daisy Mora is a 89 y.o. female with history of hypertension, hyperlipidemia, chronic kidney disease, hypothyroidism who presents to the emergency department shortness of breath that started tonight.  Patient is extremely hypertensive here.  She reports compliance with her blood pressure medications.  States shortness of breath has resolved.  No chest pain or chest discomfort.  No fevers, cough, lower extremity swelling or pain.  No history of CHF, COPD, PE or DVT.   History provided by patient.    Past Medical History:  Diagnosis Date   Hypertension     Past Surgical History:  Procedure Laterality Date   ABDOMINAL HYSTERECTOMY     APPENDECTOMY     BREAST SURGERY     INTRAMEDULLARY (IM) NAIL INTERTROCHANTERIC Left 12/23/2021   Procedure: INTRAMEDULLARY (IM) NAIL INTERTROCHANTRIC;  Surgeon: Juanell Fairly, MD;  Location: [facility] ORS;  Service: Orthopedics;  Laterality: Left;    MEDICATIONS:  Prior to Admission medications   Medication Sig Start Date End Date Taking? Authorizing Provider  acetaminophen (TYLENOL) 325 MG tablet Take 1-2 tablets (325-650 mg total) by mouth every 6 (six) hours as needed for mild pain (pain score 1-3 or temp > 100.5). 12/28/21   Azucena Fallen, MD  amLODipine (NORVASC) 10 MG tablet Take 10 mg by mouth daily.    [provider]  carvedilol (COREG) 25 MG tablet Take 25 mg by mouth 2 (two) times daily with a meal.    [provider]  cyanocobalamin 500 MCG tablet Take 500 mcg by mouth daily.    [provider]  Multiple Vitamin (MULTIVITAMIN) capsule Take 1 capsule by mouth daily.    [provider]  Omega-3 Fatty Acids (FISH OIL PO) Take by mouth daily.    [provider]  omeprazole (PRILOSEC OTC) 20 MG tablet Take 20 mg  by mouth daily.    [provider]  polyethylene glycol (MIRALAX / GLYCOLAX) 17 g packet Take 17 g by mouth daily as needed for mild constipation. 12/28/21   Azucena Fallen, MD  Turmeric 400 MG CAPS Take by mouth.    [provider]    Physical Exam   Triage Vital Signs: ED Triage Vitals  Encounter Vitals Group     BP 03/04/24 0326 (!) 221/96     Systolic BP Percentile --      Diastolic BP Percentile --      Pulse Rate 03/04/24 0326 79     Resp 03/04/24 0326 16     Temp 03/04/24 0326 97.7 F (36.5 C)     Temp Source 03/04/24 0326 Oral     SpO2 03/04/24 0326 94 %     Weight 03/04/24 0328 140 lb (63.5 kg)     Height 03/04/24 0328 5\' 3"  (1.6 m)     Head Circumference --      Peak Flow --      Pain Score 03/04/24 0328 0     Pain Loc --      Pain Education --      Exclude from Growth Chart --     Most recent vital signs: Vitals:   03/04/24 0630 03/04/24 0700  BP: (!) 204/69 (!) 206/80  Pulse:    Resp: 15 17  Temp:    SpO2: 95% 95%  CONSTITUTIONAL: Alert, responds appropriately to questions. Well-appearing; well-nourished, elderly HEAD: Normocephalic, atraumatic EYES: Conjunctivae clear, pupils appear equal, sclera nonicteric ENT: normal nose; moist mucous membranes NECK: Supple, normal ROM CARD: RRR; S1 and S2 appreciated RESP: Normal chest excursion without splinting or tachypnea; breath sounds clear and equal bilaterally; no wheezes, no rhonchi, no rales, no hypoxia or respiratory distress, speaking full sentences ABD/GI: Non-distended; soft, non-tender, no rebound, no guarding, no peritoneal signs BACK: The back appears normal EXT: Normal ROM in all joints; no deformity noted, trace edema noted at her bilateral ankles, no calf tenderness or calf swelling SKIN: Normal color for age and race; warm; no rash on exposed skin NEURO: Moves all extremities equally, normal speech PSYCH: The patient's mood and manner are appropriate.   ED Results /  Procedures / Treatments   LABS: (all labs ordered are listed, but only abnormal results are displayed) Labs Reviewed  CBC - Abnormal; Notable for the following components:      Result Value   RBC 3.85 (*)    Hemoglobin 11.8 (*)    HCT 35.5 (*)    All other components within normal limits  BASIC METABOLIC PANEL - Abnormal; Notable for the following components:   CO2 19 (*)    Glucose, Bld 114 (*)    BUN 37 (*)    Creatinine, Ser 1.46 (*)    GFR, Estimated 33 (*)    All other components within normal limits  BRAIN NATRIURETIC PEPTIDE - Abnormal; Notable for the following components:   B Natriuretic Peptide 1,003.2 (*)    All other components within normal limits  TROPONIN I (HIGH SENSITIVITY) - Abnormal; Notable for the following components:   Troponin I (High Sensitivity) 96 (*)    All other components within normal limits  TROPONIN I (HIGH SENSITIVITY) - Abnormal; Notable for the following components:   Troponin I (High Sensitivity) 244 (*)    All other components within normal limits     EKG:  EKG Interpretation Date/Time:  Monday March 04 2024 04:03:48 EDT Ventricular Rate:  79 PR Interval:  203 QRS Duration:  92 QT Interval:  415 QTC Calculation: 476 R Axis:   103  Text Interpretation: Sinus rhythm Ventricular premature complex Right axis deviation Confirmed by Rochele Raring [phone]) on 03/04/2024 4:12:11 AM         RADIOLOGY: My personal review and interpretation of imaging: Chest x-ray shows cardiomegaly, pulmonary edema, pleural effusions.  I have personally reviewed all radiology reports.   DG Chest Port 1 View Result Date: 03/04/2024 CLINICAL DATA:  [id number] with shortness of breath onset yesterday. EXAM: PORTABLE CHEST 1 VIEW COMPARISON:  Portable chest 12/22/2021 FINDINGS: The patient is rotated to the right exaggerating the mediastinum. The aorta is tortuous and calcified. The heart is moderately enlarged. The mitral ring is heavily calcified. There is  perihilar vascular congestion, mild generalized interstitial consolidation consistent with edema and small pleural effusions. Findings consistent with CHF or fluid overload. Scattered bilateral perihilar opacities are also noted, could be due to ground-glass edema or pneumonia. No other focal infiltrate is seen. There is slight thickening in the right horizontal fissure most likely due to fluid in the fissure. Additional right perihilar linear scar-like opacity. There is levoscoliosis and degenerative change of the spine with osteopenia. No new osseous findings. IMPRESSION: 1. Cardiomegaly with perihilar vascular congestion, mild generalized interstitial consolidation consistent with edema and small pleural effusions. Findings consistent with CHF or fluid overload. 2. Scattered bilateral perihilar opacities could be due  to ground-glass edema or pneumonia. 3. Aortic atherosclerosis. 4. Osteopenia and degenerative change. Electronically Signed   By: Almira Bar M.D.   On: 03/04/2024 04:26     PROCEDURES:  Critical Care performed: Yes, see critical care procedure note(s)   CRITICAL CARE Performed by: Baxter Hire Mazi Schuff   Total critical care time: 35 minutes  Critical care time was exclusive of separately billable procedures and treating other patients.  Critical care was necessary to treat or prevent imminent or life-threatening deterioration.  Critical care was time spent personally by me on the following activities: development of treatment plan with patient and/or surrogate as well as nursing, discussions with consultants, evaluation of patient's response to treatment, examination of patient, obtaining history from patient or surrogate, ordering and performing treatments and interventions, ordering and review of laboratory studies, ordering and review of radiographic studies, pulse oximetry and re-evaluation of patient's condition.   Marland Kitchen1-3 Lead EKG Interpretation  Performed by: Lisamarie Coke, Layla Maw,  DO Authorized by: [employer], Layla Maw, DO     Interpretation: normal     ECG rate:  85   ECG rate assessment: normal     Rhythm: sinus rhythm     Ectopy: none     Conduction: normal       IMPRESSION / MDM / ASSESSMENT AND PLAN / ED COURSE  I reviewed the triage vital signs and the nursing notes.    Patient here with complaints of shortness of breath that has resolved.  She is extremely hypertensive here in the ED.  The patient is on the cardiac monitor to evaluate for evidence of arrhythmia and/or significant heart rate changes.   DIFFERENTIAL DIAGNOSIS (includes but not limited to):   Hypertensive urgency, hypertensive emergency, CHF, ACS, PE, pneumonia, pneumothorax   Patient's presentation is most consistent with acute presentation with potential threat to life or bodily function.   PLAN: Will give IV hydralazine.  Will obtain cardiac labs, chest x-ray.  EKG shows no new ischemic change.   MEDICATIONS GIVEN IN ED: Medications  nicardipine (CARDENE) 20mg  in 0.86% saline IV infusion (0.1 mg/ml) (has no administration in time range)  hydrALAZINE (APRESOLINE) injection 5 mg (5 mg Intravenous Given 03/04/24 0407)  hydrALAZINE (APRESOLINE) injection 5 mg (5 mg Intravenous Given 03/04/24 0428)  furosemide (LASIX) injection 40 mg (40 mg Intravenous Given 03/04/24 0449)  aspirin chewable tablet 324 mg (324 mg Oral Given 03/04/24 0507)  hydrALAZINE (APRESOLINE) injection 5 mg (5 mg Intravenous Given 03/04/24 0508)     ED COURSE: Blood pressure improving slightly with IV hydralazine.  Patient has a normal hemoglobin.  Creatinine of 1.46 which is her baseline.  BNP is at thousand.  First troponin elevated 6.  Chest x-ray reviewed and interpreted by myself and radiologist and shows signs of volume overload.  Will give IV Lasix, aspirin.  She is asymptomatic currently.  No hypoxia or respiratory distress.  Suspect hypertensive urgency/emergency leading to CHF exacerbation.  Will admit to  the hospital service.   7:40 AM  Pt's repeat troponin is now 244 up from 96.  Will start heparin.   Blood pressure has gone back up as well.  Will start on IV Cardene infusion until she has been seen by the daytime hospitalist.   CONSULTS:  Consulted and discussed patient's case with hopitalist, Dr. Arville Care.  I have recommended admission and consulting physician agrees and will place admission orders.  Patient (and family if present) agree with this plan.   I reviewed all nursing notes, vitals,  pertinent previous records.  All labs, EKGs, imaging ordered have been independently reviewed and interpreted by myself.    OUTSIDE RECORDS REVIEWED: Reviewed last PCP note in September 2024.       FINAL CLINICAL IMPRESSION(S) / ED DIAGNOSES   Final diagnoses:  Hypertensive urgency  Acute on chronic congestive heart failure, unspecified heart failure type (HCC)  NSTEMI (non-ST elevated myocardial infarction) (HCC)     Rx / DC Orders   ED Discharge Orders     None        Note:  This document was prepared using Dragon voice recognition software and may include unintentional dictation errors.      Tyse Auriemma, Layla Maw, DO 03/04/24 [phone]

## 2024-03-04 NOTE — ED Notes (Signed)
 Called CCMD to verify patient is being monitored. Per Chrissie Noa patient is being monitored.

## 2024-03-04 NOTE — Progress Notes (Signed)
 Heart Failure Navigator Progress Note  Assessed for Heart & Vascular TOC clinic readiness.  Patient does not meet criteria due to [facility] patient.  Navigator will sign off at this time.   Roxy Horseman, RN, BSN [facility] Heart Failure Navigator Secure Chat Only

## 2024-03-04 NOTE — Progress Notes (Addendum)
 PT Cancellation Note  Patient Details Name: Daisy Mora MRN: [id number] DOB: 1926/08/09   Cancelled Treatment:    Reason Eval/Treat Not Completed: Patient not medically ready PT orders received, chart reviewed. Pt noted to have elevated BP & recently started on cardene & IV heparin, with uptrending troponins. Pt not appropriate for PT evaluation at this time. Will f/u as able.  Aleda Grana, PT, DPT 03/04/24, 8:44 AM  [name] 03/04/2024, 8:44 AM

## 2024-03-04 NOTE — Progress Notes (Signed)
 PHARMACY - ANTICOAGULATION CONSULT NOTE  Pharmacy Consult for heparin drip Indication: chest pain/ACS  Allergies  Allergen Reactions   Aspirin Tinitus   Losartan Potassium-Hctz Other (See Comments)    Patient Measurements: Height: 5\' 3"  (160 cm) Weight: 63.5 kg (140 lb) IBW/kg (Calculated) : 52.4 Heparin Dosing Weight:  63.5 kg  Vital Signs: Temp: 97.6 F (36.4 C) (03/10 1454) Temp Source: Oral (03/10 1454) BP: 148/46 (03/10 1700) Pulse Rate: 92 (03/10 1345)  Labs: Recent Labs    03/04/24 0409 03/04/24 0553 03/04/24 0803 03/04/24 1000 03/04/24 1700  HGB 11.8*  --   --   --   --   HCT 35.5*  --   --   --   --   PLT 228  --   --   --   --   APTT  --   --  30  --   --   LABPROT  --   --  13.9  --   --   INR  --   --  1.1  --   --   HEPARINUNFRC  --   --   --   --  0.15*  CREATININE 1.46*  --   --   --   --   TROPONINIHS 96* 244*  --  283*  --     Estimated Creatinine Clearance: 20.2 mL/min (A) (by C-G formula based on SCr of 1.46 mg/dL (H)).   Medical History: Past Medical History:  Diagnosis Date   Hypertension     Medications:  (Not in a hospital admission)  Scheduled:   carvedilol  6.25 mg Oral BID WC   cycloSPORINE  1 drop Both Eyes BID   fesoterodine  4 mg Oral Daily   furosemide  40 mg Intravenous BID   hydrALAZINE  25 mg Oral Q8H   isosorbide mononitrate  30 mg Oral Daily   levothyroxine  25 mcg Oral QAC breakfast   pantoprazole  40 mg Oral Daily   Infusions:   heparin 750 Units/hr (03/04/24 6578)    Assessment: 89 yo F to start heparin drip for ACS/STEMI. Admitted w/ SOB, leg swelling, Acute CHF. Hx:  chronic HFpEF, moderate MR, moderate pulmonary hypertension, refractory HTN, CKD stage IIIb, hypothyroidism. Hgb 11.8  Plt 228  INR pending  aPTT pending No anticoagulants PTA per Med Rec and Rx Fill hx review  PER H&P: -Discussed with on-call [facility] cardiology, recommended conservative management, no plan for LHC    Date/Time HL Rate  Comment 3/10 1700 0.15 750 units/hr Subtherapeutic   Goal of Therapy:  Heparin level 0.3-0.7 units/ml Monitor platelets by anticoagulation protocol: Yes   Plan:  HL subtherapeutic Give 1900 unit bolus x 1 Increase heparin infusion rate to 950 units/hr Check heparin level in 8 hours after rate change Monitor CBC daily while on heparin   Paulita Fujita, PharmD Clinical Pharmacist 03/04/2024

## 2024-03-04 NOTE — ED Notes (Signed)
 Meds given  family left  alert .

## 2024-03-04 NOTE — ED Notes (Signed)
 Hospital bed requested to increase comfort.

## 2024-03-04 NOTE — Progress Notes (Signed)
 PHARMACY - ANTICOAGULATION CONSULT NOTE  Pharmacy Consult for heparin drip Indication: chest pain/ACS  Allergies  Allergen Reactions   Aspirin Tinitus   Losartan Potassium-Hctz Other (See Comments)    Patient Measurements: Height: 5\' 3"  (160 cm) Weight: 63.5 kg (140 lb) IBW/kg (Calculated) : 52.4 Heparin Dosing Weight:    Vital Signs: Temp: 97.7 F (36.5 C) (03/10 0326) Temp Source: Oral (03/10 0326) BP: 180/52 (03/10 0845) Pulse Rate: 85 (03/10 0415)  Labs: Recent Labs    03/04/24 0409 03/04/24 0553  HGB 11.8*  --   HCT 35.5*  --   PLT 228  --   CREATININE 1.46*  --   TROPONINIHS 96* 244*    Estimated Creatinine Clearance: 20.2 mL/min (A) (by C-G formula based on SCr of 1.46 mg/dL (H)).   Medical History: Past Medical History:  Diagnosis Date   Hypertension     Medications:  (Not in a hospital admission)  Scheduled:   carvedilol  6.25 mg Oral BID WC   cycloSPORINE  1 drop Both Eyes BID   fesoterodine  4 mg Oral Daily   furosemide  40 mg Intravenous BID   hydrALAZINE  25 mg Oral Q8H   isosorbide mononitrate  30 mg Oral Daily   levothyroxine  25 mcg Oral QAC breakfast   pantoprazole  40 mg Oral Daily   Infusions:   heparin 750 Units/hr (03/04/24 0842)   niCARDipine 7.5 mg/hr (03/04/24 0847)    Assessment: 89 yo F to start heparin drip for ACS/STEMI. Admitted w/ SOB, leg swelling, Acute CHF. Hx:  chronic HFpEF, moderate MR, moderate pulmonary hypertension, refractory HTN, CKD stage IIIb, hypothyroidism. Hgb 11.8  Plt 228  INR pending  aPTT pending No anticoagulants PTA per Med Rec and Rx Fill hx review   Goal of Therapy:  Heparin level 0.3-0.7 units/ml Monitor platelets by anticoagulation protocol: Yes   Plan:  Give 3800 units bolus x 1 Start heparin infusion at 750 units/hr Check anti-Xa level in 8 hours and daily while on heparin Continue to monitor H&H and platelets PER H&P: -Discussed with on-call [facility] cardiology, recommended  conservative management, no plan for [facility]   Bari Mantis PharmD Clinical Pharmacist 03/04/2024

## 2024-03-04 NOTE — ED Notes (Signed)
 Pt arrived from home BIB [facility], called out for SOB that started yesterday as well as a cough, lungs clear bilaterally, sats 98% RA. BP initially 241/113, pt has hx of HTN, on 3 different BP medications. Denies headache or CP.    229/99 96 cbg 95% RA 85 HR

## 2024-03-05 ENCOUNTER — Inpatient Hospital Stay

## 2024-03-05 ENCOUNTER — Inpatient Hospital Stay
Admit: 2024-03-05 | Discharge: 2024-03-05 | Disposition: A | Discharge status: Home / Self Care | Attending: Student | Admitting: Student

## 2024-03-05 DIAGNOSIS — I10 Essential (primary) hypertension: Secondary | ICD-10-CM | POA: Diagnosis not present

## 2024-03-05 DIAGNOSIS — I5033 Acute on chronic diastolic (congestive) heart failure: Secondary | ICD-10-CM | POA: Diagnosis not present

## 2024-03-05 DIAGNOSIS — N183 Chronic kidney disease, stage 3 unspecified: Secondary | ICD-10-CM

## 2024-03-05 DIAGNOSIS — I2489 Other forms of acute ischemic heart disease: Secondary | ICD-10-CM | POA: Diagnosis not present

## 2024-03-05 DIAGNOSIS — N1832 Chronic kidney disease, stage 3b: Secondary | ICD-10-CM | POA: Diagnosis not present

## 2024-03-05 DIAGNOSIS — R5381 Other malaise: Secondary | ICD-10-CM

## 2024-03-05 LAB — BASIC METABOLIC PANEL
Anion gap: 10 (ref 5–15)
BUN: 40 mg/dL — ABNORMAL HIGH (ref 8–23)
CO2: 19 mmol/L — ABNORMAL LOW (ref 22–32)
Calcium: 8.8 mg/dL — ABNORMAL LOW (ref 8.9–10.3)
Chloride: 109 mmol/L (ref 98–111)
Creatinine, Ser: 1.62 mg/dL — ABNORMAL HIGH (ref 0.44–1.00)
GFR, Estimated: 29 mL/min — ABNORMAL LOW (ref >60)
Glucose, Bld: 102 mg/dL — ABNORMAL HIGH (ref 70–99)
Potassium: 4.1 mmol/L (ref 3.5–5.1)
Sodium: 138 mmol/L (ref 135–145)

## 2024-03-05 LAB — CBC
HCT: 31 % — ABNORMAL LOW (ref 36.0–46.0)
Hemoglobin: 10.2 g/dL — ABNORMAL LOW (ref 12.0–15.0)
MCH: 30.4 pg (ref 26.0–34.0)
MCHC: 32.9 g/dL (ref 30.0–36.0)
MCV: 92.3 fL (ref 80.0–100.0)
Platelets: 224 10*3/uL (ref 150–400)
RBC: 3.36 MIL/uL — ABNORMAL LOW (ref 3.87–5.11)
RDW: 13.4 % (ref 11.5–15.5)
WBC: 7.1 10*3/uL (ref 4.0–10.5)
nRBC: 0 % (ref 0.0–0.2)

## 2024-03-05 LAB — HEPARIN LEVEL (UNFRACTIONATED): Heparin Unfractionated: 0.59 [IU]/mL (ref 0.30–0.70)

## 2024-03-05 MED ORDER — LISINOPRIL 20 MG PO TABS
40.0000 mg | ORAL_TABLET | Freq: Every day | ORAL | Status: DC
  Administered 2024-03-05 – 2024-03-06 (×2): 40 mg via ORAL
  Filled 2024-03-05: qty 4
  Filled 2024-03-05: qty 2

## 2024-03-05 MED ORDER — HYDRALAZINE HCL 50 MG PO TABS
50.0000 mg | ORAL_TABLET | Freq: Three times a day (TID) | ORAL | Status: DC
  Administered 2024-03-05 – 2024-03-08 (×9): 50 mg via ORAL
  Filled 2024-03-05 (×9): qty 1

## 2024-03-05 MED ORDER — ISOSORBIDE MONONITRATE ER 60 MG PO TB24
60.0000 mg | ORAL_TABLET | Freq: Every day | ORAL | Status: DC
  Administered 2024-03-05 – 2024-03-08 (×4): 60 mg via ORAL
  Filled 2024-03-05 (×4): qty 1

## 2024-03-05 MED ORDER — AMLODIPINE BESYLATE 10 MG PO TABS
10.0000 mg | ORAL_TABLET | Freq: Every day | ORAL | Status: DC
  Administered 2024-03-05 – 2024-03-08 (×4): 10 mg via ORAL
  Filled 2024-03-05: qty 1
  Filled 2024-03-05: qty 2
  Filled 2024-03-05 (×2): qty 1

## 2024-03-05 NOTE — Assessment & Plan Note (Signed)
 Seems like underlying CKD stage IIIb.  Slight increase in creatinine today.  So holding further IV diuresis. -Monitor renal function -Avoid nephrotoxins

## 2024-03-05 NOTE — Progress Notes (Signed)
 [facility] CLINIC CARDIOLOGY PROGRESS NOTE       Patient ID: Daisy Mora MRN: [id number] DOB/AGE: January 18, 1926 89 y.o.  Admit date: 03/04/2024 Referring Physician Dr Chipper Herb Primary Physician Luciana Axe, NP Primary Cardiologist Gwen Pounds (retired) Reason for Consultation elevated trops  HPI: Daisy Mora is a 89 y.o. female with medical history significant of chronic HFpEF, moderate MR, moderate pulmonary hypertension, refractory HTN, CKD stage IIIb, hypothyroidism, presented with worsening of shortness of breath and leg swelling.   Interval history: -Patient seen and examined this AM, resting comfortably in bed.  -Denies any CP. States SOB improving.  -BP still elevated, denies headache.  -Improvement in LE edema.   Review of systems complete and found to be negative unless listed above     Past Medical History:  Diagnosis Date   Hypertension     Past Surgical History:  Procedure Laterality Date   ABDOMINAL HYSTERECTOMY     APPENDECTOMY     BREAST SURGERY     INTRAMEDULLARY (IM) NAIL INTERTROCHANTERIC Left 12/23/2021   Procedure: INTRAMEDULLARY (IM) NAIL INTERTROCHANTRIC;  Surgeon: Juanell Fairly, MD;  Location: [facility] ORS;  Service: Orthopedics;  Laterality: Left;    (Not in a hospital admission)  Social History   Socioeconomic History   Marital status: Widowed    Spouse name: Not on file   Number of children: Not on file   Years of education: Not on file   Highest education level: Not on file  Occupational History   Not on file  Tobacco Use   Smoking status: Never   Smokeless tobacco: Never  Vaping Use   Vaping status: Never Used  Substance and Sexual Activity   Alcohol use: No   Drug use: No   Sexual activity: Not Currently  Other Topics Concern   Not on file  Social History Narrative   Not on file   Social Drivers of Health   Financial Resource Strain: Low Risk  (06/12/2019)   Received from [facility]   Overall  Financial Resource Strain (CARDIA)    Difficulty of Paying Living Expenses: Not hard at all  Food Insecurity: No Food Insecurity (06/12/2019)   Received from [facility]   Hunger Vital Sign    Worried About Running Out of Food in the Last Year: Never true    Ran Out of Food in the Last Year: Never true  Transportation Needs: No Transportation Needs (06/12/2019)   Received from [facility]   PRAPARE - Transportation    Lack of Transportation (Medical): No    Lack of Transportation (Non-Medical): No  Physical Activity: Sufficiently Active (06/12/2019)   Received from [facility]   Exercise Vital Sign    Days of Exercise per Week: 6 days    Minutes of Exercise per Session: 30 min  Stress: Not on file  Social Connections: Moderately Integrated (06/12/2019)   Received from [facility]   Social Connection and Isolation Panel [NHANES]    Frequency of Communication with Friends and Family: More than three times a week    Frequency of Social Gatherings with Friends and Family: More than three times a week    Attends Religious Services: More than 4 times per year    Active Member of [employer] or Organizations: Yes    Attends Banker Meetings: More than 4 times per year    Marital Status: Widowed  Intimate Partner Violence: Not on file    History  reviewed. No pertinent family history.   Vitals:   03/05/24 0941 03/05/24 0946 03/05/24 1145 03/05/24 1306  BP: (!) 180/68  (!) 169/67 (!) 173/64  Pulse:   91   Resp:   19   Temp:  98.4 F (36.9 C)    TempSrc:  Oral    SpO2:   94%   Weight:      Height:        PHYSICAL EXAM General: awake, well nourished, in no acute distress. HEENT: Normocephalic and atraumatic. Neck: No JVD.  Lungs: Normal respiratory effort. Decreased breath sounds at bases. No wheezes, crackles, rhonchi.  Heart: HRRR. Normal S1 and S2 without gallops or murmurs.  Abdomen: Non-distended  appearing.  Msk: Normal strength and tone for age. Extremities: Warm and well perfused. No clubbing, cyanosis. 1-2+ edema.  Neuro: Alert and oriented X 3. Psych: Answers questions appropriately.   Labs: Basic Metabolic Panel: Recent Labs    03/04/24 0409 03/05/24 0304  NA 141 138  K 4.6 4.1  CL 108 109  CO2 19* 19*  GLUCOSE 114* 102*  BUN 37* 40*  CREATININE 1.46* 1.62*  CALCIUM 9.3 8.8*   Liver Function Tests: No results for input(s): "AST", "ALT", "ALKPHOS", "BILITOT", "PROT", "ALBUMIN" in the last 72 hours. No results for input(s): "LIPASE", "AMYLASE" in the last 72 hours. CBC: Recent Labs    03/04/24 0409 03/05/24 0304  WBC 7.7 7.1  HGB 11.8* 10.2*  HCT 35.5* 31.0*  MCV 92.2 92.3  PLT 228 224   Cardiac Enzymes: Recent Labs    03/04/24 0409 03/04/24 0553 03/04/24 1000  TROPONINIHS 96* 244* 283*   BNP: Recent Labs    03/04/24 0409  BNP 1,003.2*   D-Dimer: No results for input(s): "DDIMER" in the last 72 hours. Hemoglobin A1C: No results for input(s): "HGBA1C" in the last 72 hours. Fasting Lipid Panel: No results for input(s): "CHOL", "HDL", "LDLCALC", "TRIG", "CHOLHDL", "LDLDIRECT" in the last 72 hours. Thyroid Function Tests: Recent Labs    03/04/24 0553  TSH 4.862*   Anemia Panel: No results for input(s): "VITAMINB12", "FOLATE", "FERRITIN", "TIBC", "IRON", "RETICCTPCT" in the last 72 hours.   Radiology: DG Chest 1 View Result Date: 03/05/2024 CLINICAL DATA:  Congestive heart failure. EXAM: CHEST  1 VIEW COMPARISON:  03/04/2024 FINDINGS: Stable cardiac enlargement. Stable small bilateral pleural effusions. Degree of pulmonary interstitial edema may be mildly improved. No focal airspace consolidation or pneumothorax. IMPRESSION: Mild improvement in pulmonary interstitial edema. Stable small bilateral pleural effusions. Stable cardiac enlargement. Electronically Signed   By: Irish Lack M.D.   On: 03/05/2024 11:10   DG Chest Port 1 View Result  Date: 03/04/2024 CLINICAL DATA:  [id number] with shortness of breath onset yesterday. EXAM: PORTABLE CHEST 1 VIEW COMPARISON:  Portable chest 12/22/2021 FINDINGS: The patient is rotated to the right exaggerating the mediastinum. The aorta is tortuous and calcified. The heart is moderately enlarged. The mitral ring is heavily calcified. There is perihilar vascular congestion, mild generalized interstitial consolidation consistent with edema and small pleural effusions. Findings consistent with CHF or fluid overload. Scattered bilateral perihilar opacities are also noted, could be due to ground-glass edema or pneumonia. No other focal infiltrate is seen. There is slight thickening in the right horizontal fissure most likely due to fluid in the fissure. Additional right perihilar linear scar-like opacity. There is levoscoliosis and degenerative change of the spine with osteopenia. No new osseous findings. IMPRESSION: 1. Cardiomegaly with perihilar vascular congestion, mild generalized interstitial consolidation consistent with edema and  small pleural effusions. Findings consistent with CHF or fluid overload. 2. Scattered bilateral perihilar opacities could be due to ground-glass edema or pneumonia. 3. Aortic atherosclerosis. 4. Osteopenia and degenerative change. Electronically Signed   By: Almira Bar M.D.   On: 03/04/2024 04:26    ECHO pending  TELEMETRY reviewed by me [facility]) 03/05/2024 : NSR rate 80s  EKG reviewed by me: NSR with PVCs  Data reviewed by me [facility]) 03/05/2024: last 24h vitals tele labs imaging I/O provider notes  Principal Problem:   Acute CHF (congestive heart failure) (HCC) Active Problems:   CHF (congestive heart failure) (HCC)    ASSESSMENT AND PLAN:   Acute on chronic HFpEF decompensation Chronic MR Chronic moderate pulmonary hypertension -Aggressive IV diuresis IV 40 mg Lasix twice daily -Monitor kidney function -increase imdur and hydralazine. Continue coreg and amlodipine.     Demand ischemia -recommend conservative management -no plan for invasive cardiac work-up -no need to further trend trops -maintain on telemetry -suspect trops elevated due to HTN emergency, DC heparin   HTN emergency -restart PO home BP meds -Off nicardipine gtt   This patient's plan of care was discussed and created with Dr. Melton Alar and she is in agreement.    Signed: Gale Journey, PA-C 03/05/2024, 1:28 PM [facility]

## 2024-03-05 NOTE — Evaluation (Signed)
 Physical Therapy Evaluation Patient Details Name: Daisy Mora MRN: [id number] DOB: 27-Jul-1926 Today's Date: 03/05/2024  History of Present Illness  Pt is a 89 y.o. female with medical history significant of chronic HFpEF, moderate MR, moderate pulmonary hypertension, refractory HTN, CKD stage IIIb, hypothyroidism, presented with worsening of shortness of breath and leg swelling. MD assessment includes: acute CHF, NSTEMI, HTN emergency, and deconditioning.   Clinical Impression  Pt was pleasant and motivated to participate during the session and put forth good effort throughout. Pt required extra time and effort with bed mobility tasks but no physical assist.  Once in sitting at the EOB pt presented with a mild R lateral lean that only improved temporarily with L lateral weight shifting activities.  Pt able to come to standing without assist but once in standing presented with R lateral lean that required one instance of min A to correct.  Pt's R lateral lean in standing was exacerbated by occasional very narrow stance with some improvement noted with L lateral weight shifting activities and cues for wider BOS.  Pt was able to take several steps near the EOB and then ambulate around the room with stability grossly improving and with no further instances of assist required for stability although the tendency for R lateral lean persisted.  Pt's daughter in room and stated that pt will have 24/7 supervision upon discharge with education provided on importance of CGA with ambulation/standing activities until cleared by HHPT, daughter understood and agreed.  Pt will benefit from continued PT services upon discharge to safely address deficits listed in patient problem list for decreased caregiver assistance and eventual return to PLOF.           If plan is discharge home, recommend the following: A little help with walking and/or transfers;A little help with bathing/dressing/bathroom;Assistance with  cooking/housework;Direct supervision/assist for medications management;Assist for transportation   Can travel by private vehicle        Equipment Recommendations Other (comment) (Recommending RW with family stating they will obtain)  Recommendations for Other Services       Functional Status Assessment Patient has had a recent decline in their functional status and demonstrates the ability to make significant improvements in function in a reasonable and predictable amount of time.     Precautions / Restrictions Precautions Precautions: Fall Restrictions Weight Bearing Restrictions Per Provider Order: No Other Position/Activity Restrictions: Presented with consistent lean to the right      Mobility  Bed Mobility Overal bed mobility: Needs Assistance Bed Mobility: Supine to Sit, Sit to Supine     Supine to sit: Supervision Sit to supine: Supervision   General bed mobility comments: Min extra time and effort with use of bed rail    Transfers Overall transfer level: Needs assistance Equipment used: Rolling walker (2 wheels) Transfers: Sit to/from Stand Sit to Stand: Min assist           General transfer comment: Min A to address mild R lateral lean upon initial stand    Ambulation/Gait Ambulation/Gait assistance: Contact guard assist Gait Distance (Feet): 30 Feet Assistive device: Rolling walker (2 wheels) Gait Pattern/deviations: Narrow base of support, Step-through pattern, Decreased step length - right, Decreased step length - left Gait velocity: decreased     General Gait Details: Slow cadence with mild R lateral lean and occasional narrow BOS but generally steady with no overt LOB  Acupuncturist  Bed    Modified Rankin (Stroke Patients Only)       Balance Overall balance assessment: Needs assistance   Sitting balance-Leahy Scale: Good   Postural control: Right lateral lean Standing balance support:  Bilateral upper extremity supported, During functional activity, Reliant on assistive device for balance Standing balance-Leahy Scale: Fair Standing balance comment: Mild R lateral lean in standing with one instance of min A to prevent LOB but stability grossly improved as the session progressed                             Pertinent Vitals/Pain Pain Assessment Pain Assessment: No/denies pain    Home Living Family/patient expects to be discharged to:: Private residence Living Arrangements: Alone Available Help at Discharge: Family;Available 24 hours/day Type of Home: House Home Access: Level entry       Home Layout: One level Home Equipment: BSC/3in1;Cane - quad;Rollator (4 wheels) Additional Comments: Has a bed rail    Prior Function Prior Level of Function : Independent/Modified Independent             Mobility Comments: Mod Ind amb with a QC in the home and with a rollator in the community, no fall history ADLs Comments: Ind with ADLs     Extremity/Trunk Assessment   Upper Extremity Assessment Upper Extremity Assessment: Generalized weakness    Lower Extremity Assessment Lower Extremity Assessment: Generalized weakness       Communication   Communication Communication: No apparent difficulties    Cognition Arousal: Alert Behavior During Therapy: WFL for tasks assessed/performed   PT - Cognitive impairments: No apparent impairments                         Following commands: Intact       Cueing Cueing Techniques: Verbal cues, Tactile cues, Visual cues     General Comments      Exercises Other Exercises Other Exercises: L lateral weight shifting in sitting and standing to address R lateral lean   Assessment/Plan    PT Assessment Patient needs continued PT services  PT Problem List Decreased strength;Decreased activity tolerance;Decreased balance;Decreased mobility;Decreased knowledge of use of DME       PT Treatment  Interventions DME instruction;Gait training;Functional mobility training;Therapeutic activities;Therapeutic exercise;Balance training;Neuromuscular re-education;Patient/family education    PT Goals (Current goals can be found in the Care Plan section)  Acute Rehab PT Goals Patient Stated Goal: To return home PT Goal Formulation: With patient Time For Goal Achievement: 03/18/24 Potential to Achieve Goals: Good    Frequency Min 2X/week     Co-evaluation               AM-PAC PT "6 Clicks" Mobility  Outcome Measure Help needed turning from your back to your side while in a flat bed without using bedrails?: A Little Help needed moving from lying on your back to sitting on the side of a flat bed without using bedrails?: A Little Help needed moving to and from a bed to a chair (including a wheelchair)?: A Little Help needed standing up from a chair using your arms (e.g., wheelchair or bedside chair)?: A Little Help needed to walk in hospital room?: A Little Help needed climbing 3-5 steps with a railing? : A Lot 6 Click Score: 17    End of Session Equipment Utilized During Treatment: Gait belt Activity Tolerance: Patient tolerated treatment well Patient left: in bed;with call bell/phone within  reach;with nursing/sitter in room;with family/visitor present;Other (comment) (bilateral bed rails up as found in ED bed) Nurse Communication: Mobility status;Other (comment) (MD notified of pt's R lateral leaning during the session) PT Visit Diagnosis: Unsteadiness on feet (R26.81);Difficulty in walking, not elsewhere classified (R26.2);Muscle weakness (generalized) (M62.81)    Time: 2956-2130 PT Time Calculation (min) (ACUTE ONLY): 31 min   Charges:   PT Evaluation $PT Eval Moderate Complexity: 1 Mod PT Treatments $Therapeutic Activity: 8-22 mins PT General Charges $$ ACUTE PT VISIT: 1 Visit       D. Elly Modena PT, DPT 03/05/24, 12:15 PM

## 2024-03-05 NOTE — Assessment & Plan Note (Signed)
-   PT and OT evaluation

## 2024-03-05 NOTE — ED Notes (Signed)
 Upon flushing IV in Left forearm IV noted to be infiltrated. IV removed by this RN

## 2024-03-05 NOTE — Hospital Course (Addendum)
 Taken from H&P.   Daisy Mora is a 89 y.o. female with medical history significant of chronic HFpEF, moderate MR, moderate pulmonary hypertension, refractory HTN, CKD stage IIIb, hypothyroidism, presented with worsening of shortness of breath and leg swelling.  She was also experiencing exertional dyspnea and orthopnea.  On presentation she had significantly elevated blood pressure at 220/90. Chest x-ray showed pulmonary Congestion and cardiomegaly, blood work showed hemoglobin 11.8, WBC 7.7, creatinine 1.4 compared to baseline 1.3-1.4, bicarb 19K 4.6.  Troponin trending 96> 244.  EKG showed frequent PVCs, sinus rhythm no acute ST changes.   Patient was started on IV diuresis and heparin infusion in ED.  Cardiology was consulted.  3/11: Blood pressure elevated at 170/60, restarting home amlodipine.  Hemoglobin decreased to 10.2 this morning with no evidence of bleeding.  TSH mildly elevated at 4.862, last recorded troponin at 283, BNP 1003. Cardene infusion has been discontinued. Echocardiogram pending.

## 2024-03-05 NOTE — Progress Notes (Signed)
 Occupational Therapy Evaluation Patient Details Name: Daisy Mora MRN: [id number] DOB: [birth year] Today's Date: 03/05/2024   History of Present Illness   Pt is a 89 y.o. female with medical history significant of chronic HFpEF, moderate MR, moderate pulmonary hypertension, refractory HTN, CKD stage IIIb, hypothyroidism, presented with worsening of shortness of breath and leg swelling. MD assessment includes: acute CHF, NSTEMI, HTN emergency, and deconditioning.     Clinical Impressions Pt was seen for OT evaluation this date. Pt was alert and oriented x4, with daughters at bedside. Prior to hospital admission, pt was indp in ADLs, living alone with family checking on her often. PTA pt was amb with SPC on her R side.  Pt presents to acute OT demonstrating impaired ADL performance, decrease strength and functional mobility (See OT problem list for additional functional deficits). Pt currently requires CGA during amb, Pt amb to toilet within room with CGA + RW, verbal cue to correct mininal right sided lean x2). Total amb ~68ft within room, no LOB noted. Pt completed pericare with supervision (sitting/lateral leans), required MODA for STS off low toilet seat, pt reports her one at home is much higher. Pt retired in room with family at bed side and all her needs within reach. OT Pt would benefit from skilled OT services to address noted impairments and functional limitations (see below for any additional details) in order to maximize safety and independence while minimizing falls risk and caregiver burden. OT will follow acutely.   Blood pressure during mobility: BP148/137 (101), HR 95, Spo2 96% Blood pressure post mobility in supine: BP 170/65 (96), HR 96, Spo2 95%      If plan is discharge home, recommend the following:   A little help with walking and/or transfers;A little help with bathing/dressing/bathroom;Assistance with cooking/housework;Assist for transportation;Help with stairs or ramp  for entrance     Functional Status Assessment   Patient has had a recent decline in their functional status and demonstrates the ability to make significant improvements in function in a reasonable and predictable amount of time.     Equipment Recommendations   Other (comment) (Rolling walker)     Recommendations for Other Services         Precautions/Restrictions   Precautions Precautions: Fall Restrictions Weight Bearing Restrictions Per Provider Order: No     Mobility Bed Mobility Overal bed mobility: Needs Assistance Bed Mobility: Supine to Sit, Sit to Supine     Supine to sit: Supervision Sit to supine: Min assist, Used rails (LE management)   General bed mobility comments:  (Extra time and effortfull with MIN A for LE management)    Transfers Overall transfer level: Needs assistance Equipment used: Rolling walker (2 wheels) Transfers: Sit to/from Stand Sit to Stand: Min assist           General transfer comment: MINA for STS from low toilet seat      Balance Overall balance assessment: Needs assistance Sitting-balance support: Feet unsupported, Single extremity supported Sitting balance-Leahy Scale: Good   Postural control: Right lateral lean Standing balance support: Bilateral upper extremity supported, During functional activity, Reliant on assistive device for balance Standing balance-Leahy Scale: Fair                             ADL either performed or assessed with clinical judgement   ADL Overall ADL's : Needs assistance/impaired Eating/Feeding: Sitting;Independent  Lower Body Dressing: Moderate assistance;Sit to/from stand   Toilet Transfer: Contact guard assist;Ambulation;Rolling walker (2 wheels);Regular Toilet;Moderate assistance   Toileting- Clothing Manipulation and Hygiene: Modified independent;Sitting/lateral lean       Functional mobility during ADLs: Contact guard assist;Rolling  walker (2 wheels);Cueing for safety (Pt amb to toilet within room with CGA + RW, verbal cue to correct mininal right sided lean) General ADL Comments: Pt completed pericare with supervision (sitting/lateral leans), required MODA for STS off low toilet seat, pt reports her one at home is much higher.      Pertinent Vitals/Pain Pain Assessment Pain Assessment: No/denies pain     Extremity/Trunk Assessment Upper Extremity Assessment Upper Extremity Assessment: Generalized weakness;Overall [facility] for tasks assessed   Lower Extremity Assessment Lower Extremity Assessment: Generalized weakness;Overall WFL for tasks assessed       Communication Communication Communication: No apparent difficulties Factors Affecting Communication: Hearing impaired   Cognition Arousal: Alert Behavior During Therapy: WFL for tasks assessed/performed Cognition: No apparent impairments             OT - Cognition Comments: Pt a/o x4 throughout session                 Following commands: Intact       Cueing  General Comments   Cueing Techniques: Verbal cues;Tactile cues;Visual cues  Noted R distended lump on pt R inner thigh, she states it has been there a while   Exercises Exercises: Other exercises Other Exercises Other Exercises: Edu: OT role, L later lean awareness during sitting/standing, RW management, safe ADL completion   Shoulder Instructions      Home Living Family/patient expects to be discharged to:: Private residence Living Arrangements: Alone Available Help at Discharge: Family;Available 24 hours/day Type of Home: House Home Access: Level entry     Home Layout: One level     Bathroom Shower/Tub: Chief Strategy Officer: Handicapped height     Home Equipment: BSC/3in1;Cane - quad;Rollator (4 wheels)   Additional Comments: Has a bed rail      Prior Functioning/Environment Prior Level of Function : Independent/Modified Independent              Mobility Comments: Mod Ind amb with a QC in the home and with a rollator in the community, no fall history ADLs Comments: Ind with ADLs    OT Problem List: Decreased strength;Decreased activity tolerance;Impaired balance (sitting and/or standing);Decreased coordination;Decreased knowledge of use of DME or AE;Decreased safety awareness   OT Treatment/Interventions: Self-care/ADL training;DME and/or AE instruction;Energy conservation;Therapeutic activities      OT Goals(Current goals can be found in the care plan section)   Acute Rehab OT Goals Patient Stated Goal: to get back home OT Goal Formulation: With patient/family Time For Goal Achievement: 03/19/24 Potential to Achieve Goals: Good   OT Frequency:  Min 1X/week    Co-evaluation              AM-PAC OT "6 Clicks" Daily Activity     Outcome Measure Help from another person eating meals?: None Help from another person taking care of personal grooming?: A Lot Help from another person toileting, which includes using toliet, bedpan, or urinal?: A Little Help from another person bathing (including washing, rinsing, drying)?: A Lot Help from another person to put on and taking off regular upper body clothing?: None Help from another person to put on and taking off regular lower body clothing?: A Lot 6 Click Score: 17   End of Session  Equipment Utilized During Treatment: Gait belt;Rolling walker (2 wheels) Nurse Communication: Mobility status  Activity Tolerance: Patient tolerated treatment well Patient left: in bed;with call bell/phone within reach;with family/visitor present  OT Visit Diagnosis: Unsteadiness on feet (R26.81);Other abnormalities of gait and mobility (R26.89);Muscle weakness (generalized) (M62.81)                Time: 0454-0981 OT Time Calculation (min): 42 min Charges:  OT General Charges $OT Visit: 1 Visit OT Evaluation $OT Eval Low Complexity: 1 Low OT Treatments $Self Care/Home Management : 8-22  mins  Glenard Haring M.S. OTR/L  03/05/24, 3:31 PM

## 2024-03-05 NOTE — Assessment & Plan Note (Signed)
 Slightly increase troponin with no chest pain likely demand ischemia with significantly elevated blood pressure and acute on chronic HFpEF. Cardiology was consulted-suggesting conservative management Initially started on heparin infusion which was discontinued by cardiology. Echocardiogram pending

## 2024-03-05 NOTE — Progress Notes (Signed)
 PHARMACY - ANTICOAGULATION CONSULT NOTE  Pharmacy Consult for heparin drip Indication: chest pain/ACS  Allergies  Allergen Reactions   Aspirin Tinitus   Losartan Potassium-Hctz Other (See Comments)    Patient Measurements: Height: 5\' 3"  (160 cm) Weight: 63.5 kg (140 lb) IBW/kg (Calculated) : 52.4 Heparin Dosing Weight:  63.5 kg  Vital Signs: Temp: 98.4 F (36.9 C) (03/11 0200) Temp Source: Oral (03/10 2203) BP: 172/67 (03/11 0200) Pulse Rate: 88 (03/11 0200)  Labs: Recent Labs    03/04/24 0409 03/04/24 0553 03/04/24 0803 03/04/24 1000 03/04/24 1700 03/05/24 0304  HGB 11.8*  --   --   --   --  10.2*  HCT 35.5*  --   --   --   --  31.0*  PLT 228  --   --   --   --  224  APTT  --   --  30  --   --   --   LABPROT  --   --  13.9  --   --   --   INR  --   --  1.1  --   --   --   HEPARINUNFRC  --   --   --   --  0.15* 0.59  CREATININE 1.46*  --   --   --   --   --   TROPONINIHS 96* 244*  --  283*  --   --     Estimated Creatinine Clearance: 20.2 mL/min (A) (by C-G formula based on SCr of 1.46 mg/dL (H)).   Medical History: Past Medical History:  Diagnosis Date   Hypertension     Medications:  (Not in a hospital admission)  Scheduled:   carvedilol  6.25 mg Oral BID WC   cycloSPORINE  1 drop Both Eyes BID   fesoterodine  4 mg Oral Daily   furosemide  40 mg Intravenous BID   hydrALAZINE  25 mg Oral Q8H   isosorbide mononitrate  30 mg Oral Daily   levothyroxine  25 mcg Oral QAC breakfast   pantoprazole  40 mg Oral Daily   Infusions:   heparin 950 Units/hr (03/05/24 0230)    Assessment: 89 yo F to start heparin drip for ACS/STEMI. Admitted w/ SOB, leg swelling, Acute CHF. Hx:  chronic HFpEF, moderate MR, moderate pulmonary hypertension, refractory HTN, CKD stage IIIb, hypothyroidism. Hgb 11.8  Plt 228  INR pending  aPTT pending No anticoagulants PTA per Med Rec and Rx Fill hx review  PER H&P: -Discussed with on-call [facility] cardiology, recommended  conservative management, no plan for LHC   Date/Time HL Rate  Comment 3/10 1700 0.15 750 units/hr Subtherapeutic  3/11 0304        0.59     950 units/hr    Therapeutic X 1   Goal of Therapy:  Heparin level 0.3-0.7 units/ml Monitor platelets by anticoagulation protocol: Yes   Plan:  3/11:  HL @ 0304 = 0.59, therapeutic X 1 - Will continue pt on current rate and recheck HL in 8 hrs.  Monitor CBC daily while on heparin   Treyveon Mochizuki D Clinical Pharmacist 03/05/2024

## 2024-03-05 NOTE — Progress Notes (Signed)
 Echocardiogram 2D Echocardiogram has been performed.  Daisy Mora 03/05/2024, 5:51 PM

## 2024-03-05 NOTE — Assessment & Plan Note (Signed)
 Patient presented with significantly elevated blood pressure initially requiring Cardene infusion which was later discontinued. Blood pressure elevated this morning -Continuing Coreg and hydralazine -Cardiology started amlodipine and lisinopril which was initially held for concern of acute on chronic CHF. -We can restart home clonidine if needed

## 2024-03-05 NOTE — Progress Notes (Signed)
 Progress Note   Patient: Daisy Mora JWJ:[id number] DOB: [birth year] DOA: 03/04/2024     1 DOS: the patient was seen and examined on 03/05/2024   Brief hospital course: Taken from H&P.   Daisy Mora is a 89 y.o. female with medical history significant of chronic HFpEF, moderate MR, moderate pulmonary hypertension, refractory HTN, CKD stage IIIb, hypothyroidism, presented with worsening of shortness of breath and leg swelling.  She was also experiencing exertional dyspnea and orthopnea.  On presentation she had significantly elevated blood pressure at 220/90. Chest x-ray showed pulmonary Congestion and cardiomegaly, blood work showed hemoglobin 11.8, WBC 7.7, creatinine 1.4 compared to baseline 1.3-1.4, bicarb 19K 4.6.  Troponin trending 96> 244.  EKG showed frequent PVCs, sinus rhythm no acute ST changes.   Patient was started on IV diuresis and heparin infusion in ED.  Cardiology was consulted.  3/11: Blood pressure elevated at 170/60, restarting home amlodipine.  Hemoglobin decreased to 10.2 this morning with no evidence of bleeding.  TSH mildly elevated at 4.862, last recorded troponin at 283, BNP 1003. Cardene infusion has been discontinued. Echocardiogram pending.  Assessment and Plan: * Acute on chronic heart failure with preserved ejection fraction (HFpEF) (HCC) Patient with history of HFpEF, chronic MR and moderate pulmonary hypertension.  Patient with increased BUN and creatinine today. Received 1 dose of IV Lasix this morning, total of 3 doses since hospitalization-holding further IV Lasix Cardiology is on board Echocardiogram pending Can start p.o. diuresis from tomorrow if renal functions remain stable -Strict intake and output -Daily weight and BMP  Demand ischemia (HCC) Slightly increase troponin with no chest pain likely demand ischemia with significantly elevated blood pressure and acute on chronic HFpEF. Cardiology was consulted-suggesting conservative  management Initially started on heparin infusion which was discontinued by cardiology. Echocardiogram pending  Chronic kidney disease (CKD), stage III (moderate) (HCC) Seems like underlying CKD stage IIIb.  Slight increase in creatinine today.  So holding further IV diuresis. -Monitor renal function -Avoid nephrotoxins  Essential hypertension Patient presented with significantly elevated blood pressure initially requiring Cardene infusion which was later discontinued. Blood pressure elevated this morning -Continuing Coreg and hydralazine -Cardiology started amlodipine and lisinopril which was initially held for concern of acute on chronic CHF. -We can restart home clonidine if needed  Physical deconditioning PT and OT evaluation   Subjective: Patient was resting comfortably when seen today.  No chest pain or shortness of breath this morning.  Daughter at bedside.  Physical Exam: Vitals:   03/05/24 0941 03/05/24 0946 03/05/24 1145 03/05/24 1306  BP: (!) 180/68  (!) 169/67 (!) 173/64  Pulse:   91   Resp:   19   Temp:  98.4 F (36.9 C)    TempSrc:  Oral    SpO2:   94%   Weight:      Height:       General.  Frail elderly lady, in no acute distress. Pulmonary.  Lungs clear bilaterally, normal respiratory effort. CV.  Regular rate and rhythm, positive murmur Abdomen.  Soft, nontender, nondistended, BS positive. CNS.  Alert and oriented .  No focal neurologic deficit. Extremities.  Trace LE edema, no cyanosis, pulses intact and symmetrical.  Data Reviewed: Prior data reviewed.  Family Communication: Discussed with daughter at bedside  Disposition: Status is: Inpatient Remains inpatient appropriate because: Severity of illness  Planned Discharge Destination: Home  Time spent: 50 minutes  This record has been created using Conservation officer, historic buildings. Errors have been sought and corrected,but  may not always be located. Such creation errors do not reflect on the  standard of care.   Author: Arnetha Courser, MD 03/05/2024 1:48 PM  For on call review [email or web address].

## 2024-03-05 NOTE — Assessment & Plan Note (Addendum)
 Patient with history of HFpEF, chronic MR and moderate pulmonary hypertension.  Patient with increased BUN and creatinine today. Received 1 dose of IV Lasix this morning, total of 3 doses since hospitalization-holding further IV Lasix Cardiology is on board Echocardiogram pending Can start p.o. diuresis from tomorrow if renal functions remain stable -Strict intake and output -Daily weight and BMP

## 2024-03-06 DIAGNOSIS — I5033 Acute on chronic diastolic (congestive) heart failure: Secondary | ICD-10-CM | POA: Diagnosis not present

## 2024-03-06 LAB — ECHOCARDIOGRAM COMPLETE
AR max vel: 2.52 cm2
AV Peak grad: 11.2 mmHg
Ao pk vel: 1.67 m/s
Area-P 1/2: 4.1 cm2
Height: 63 in
MV VTI: 2.12 cm2
S' Lateral: 1.94 cm
Weight: 2240 [oz_av]

## 2024-03-06 LAB — BASIC METABOLIC PANEL
Anion gap: 10 (ref 5–15)
Anion gap: 10 (ref 5–15)
BUN: 39 mg/dL — ABNORMAL HIGH (ref 8–23)
BUN: 45 mg/dL — ABNORMAL HIGH (ref 8–23)
CO2: 21 mmol/L — ABNORMAL LOW (ref 22–32)
CO2: 20 mmol/L — ABNORMAL LOW (ref 22–32)
Calcium: 8.9 mg/dL (ref 8.9–10.3)
Calcium: 8.5 mg/dL — ABNORMAL LOW (ref 8.9–10.3)
Chloride: 108 mmol/L (ref 98–111)
Chloride: 107 mmol/L (ref 98–111)
Creatinine, Ser: 1.6 mg/dL — ABNORMAL HIGH (ref 0.44–1.00)
Creatinine, Ser: 2.02 mg/dL — ABNORMAL HIGH (ref 0.44–1.00)
GFR, Estimated: 29 mL/min — ABNORMAL LOW (ref >60)
GFR, Estimated: 22 mL/min — ABNORMAL LOW (ref >60)
Glucose, Bld: 95 mg/dL (ref 70–99)
Glucose, Bld: 140 mg/dL — ABNORMAL HIGH (ref 70–99)
Potassium: 3.9 mmol/L (ref 3.5–5.1)
Potassium: 3.8 mmol/L (ref 3.5–5.1)
Sodium: 139 mmol/L (ref 135–145)
Sodium: 137 mmol/L (ref 135–145)

## 2024-03-06 LAB — CBC
HCT: 30.3 % — ABNORMAL LOW (ref 36.0–46.0)
Hemoglobin: 9.9 g/dL — ABNORMAL LOW (ref 12.0–15.0)
MCH: 30.4 pg (ref 26.0–34.0)
MCHC: 32.7 g/dL (ref 30.0–36.0)
MCV: 92.9 fL (ref 80.0–100.0)
Platelets: 210 10*3/uL (ref 150–400)
RBC: 3.26 MIL/uL — ABNORMAL LOW (ref 3.87–5.11)
RDW: 13.4 % (ref 11.5–15.5)
WBC: 7 10*3/uL (ref 4.0–10.5)
nRBC: 0 % (ref 0.0–0.2)

## 2024-03-06 MED ORDER — CARVEDILOL 12.5 MG PO TABS
12.5000 mg | ORAL_TABLET | Freq: Two times a day (BID) | ORAL | Status: DC
  Administered 2024-03-06 – 2024-03-08 (×5): 12.5 mg via ORAL
  Filled 2024-03-06 (×4): qty 1

## 2024-03-06 MED ORDER — CARVEDILOL 12.5 MG PO TABS: 12.5000 mg | ORAL_TABLET | Freq: Two times a day (BID) | ORAL | Status: DC

## 2024-03-06 MED ORDER — FUROSEMIDE 20 MG PO TABS: 20.0000 mg | ORAL_TABLET | Freq: Every day | ORAL | Status: DC

## 2024-03-06 NOTE — Progress Notes (Signed)
 Physical Therapy Treatment Patient Details Name: Daisy Mora MRN: [id number] DOB: 08-07-1926 Today's Date: 03/06/2024   History of Present Illness Pt is a 89 y.o. female with medical history significant of chronic HFpEF, moderate MR, moderate pulmonary hypertension, refractory HTN, CKD stage IIIb, hypothyroidism, presented with worsening of shortness of breath and leg swelling. MD assessment includes: acute CHF, NSTEMI, HTN emergency, and deconditioning.    PT Comments  Pt was pleasant and motivated to participate during the session and put forth good effort throughout. Pt required no physical assistance during the session and presented with improved general stability and significantly less R lateral lean in sitting and standing compared to prior session. Pt was able to participate in two bouts of ambulation 1 x 60 feet and 1 x 80 feet with a RW with good stability and with SpO2 and HR WNL on room air. Pt reported no adverse symptoms during the session.  Pt will benefit from continued PT services upon discharge to safely address deficits listed in patient problem list for decreased caregiver assistance and eventual return to PLOF.         If plan is discharge home, recommend the following: A little help with walking and/or transfers;A little help with bathing/dressing/bathroom;Assistance with cooking/housework;Direct supervision/assist for medications management;Assist for transportation   Can travel by private vehicle        Equipment Recommendations  Other (comment) (Recommending RW but family stated they will obtain)    Recommendations for Other Services       Precautions / Restrictions Precautions Precautions: Fall Restrictions Weight Bearing Restrictions Per Provider Order: No     Mobility  Bed Mobility Overal bed mobility: Modified Independent             General bed mobility comments: Min extra time and effort only    Transfers Overall transfer level: Needs  assistance Equipment used: Rolling walker (2 wheels) Transfers: Sit to/from Stand Sit to Stand: Contact guard assist           General transfer comment: Good eccentric and concentric control and stability from various height surfaces    Ambulation/Gait Ambulation/Gait assistance: Contact guard assist Gait Distance (Feet): 60 Feet x 1, 80 Feet x 1  Assistive device: Rolling walker (2 wheels) Gait Pattern/deviations: Narrow base of support, Step-through pattern, Decreased step length - right, Decreased step length - left Gait velocity: decreased     General Gait Details: Slow cadence with very mild R lateral lean but generally steady with no overt LOB; lateral lean significantly improved compared to prior session   Stairs             Wheelchair Mobility     Tilt Bed    Modified Rankin (Stroke Patients Only)       Balance Overall balance assessment: Needs assistance Sitting-balance support: Feet unsupported Sitting balance-Leahy Scale: Normal Sitting balance - Comments: Very mild R lateral lean, much improved compared to prior session, normal sitting balance   Standing balance support: Bilateral upper extremity supported, During functional activity, Reliant on assistive device for balance Standing balance-Leahy Scale: Good                              Communication Communication Communication: No apparent difficulties  Cognition Arousal: Alert Behavior During Therapy: WFL for tasks assessed/performed   PT - Cognitive impairments: No apparent impairments  Following commands: Intact      Cueing Cueing Techniques: Verbal cues, Tactile cues, Visual cues  Exercises      General Comments        Pertinent Vitals/Pain Pain Assessment Pain Assessment: No/denies pain    Home Living                          Prior Function            PT Goals (current goals can now be found in the care plan  section) Progress towards PT goals: Progressing toward goals    Frequency    Min 2X/week      PT Plan      Co-evaluation              AM-PAC PT "6 Clicks" Mobility   Outcome Measure  Help needed turning from your back to your side while in a flat bed without using bedrails?: A Little Help needed moving from lying on your back to sitting on the side of a flat bed without using bedrails?: A Little Help needed moving to and from a bed to a chair (including a wheelchair)?: A Little Help needed standing up from a chair using your arms (e.g., wheelchair or bedside chair)?: A Little Help needed to walk in hospital room?: A Little Help needed climbing 3-5 steps with a railing? : A Little 6 Click Score: 18    End of Session Equipment Utilized During Treatment: Gait belt Activity Tolerance: Patient tolerated treatment well Patient left: with nursing/sitter in room;with family/visitor present;Other (comment);in chair Nurse Communication: Mobility status PT Visit Diagnosis: Unsteadiness on feet (R26.81);Difficulty in walking, not elsewhere classified (R26.2);Muscle weakness (generalized) (M62.81)     Time: 1610-9604 PT Time Calculation (min) (ACUTE ONLY): 21 min  Charges:    $Gait Training: 8-22 mins PT General Charges $$ ACUTE PT VISIT: 1 Visit                    D. Scott Gladiola Madore PT, DPT 03/06/24, 5:11 PM

## 2024-03-06 NOTE — Plan of Care (Signed)

## 2024-03-06 NOTE — Progress Notes (Signed)
 Progress Note   Patient: Daisy Mora NFA:[id number] DOB: 05-16-1926 DOA: 03/04/2024     2 DOS: the patient was seen and examined on 03/06/2024     Brief hospital course: Taken from H&P.    Daisy Mora is a 89 y.o. female with medical history significant of chronic HFpEF, moderate MR, moderate pulmonary hypertension, refractory HTN, CKD stage IIIb, hypothyroidism, presented with worsening of shortness of breath and leg swelling.  She was also experiencing exertional dyspnea and orthopnea.   On presentation she had significantly elevated blood pressure at 220/90. Chest x-ray showed pulmonary Congestion and cardiomegaly, blood work showed hemoglobin 11.8, WBC 7.7, creatinine 1.4 compared to baseline 1.3-1.4, bicarb 19K 4.6.  Troponin trending 96> 244.  EKG showed frequent PVCs, sinus rhythm no acute ST changes.    Patient was started on IV diuresis and heparin infusion in ED.  Cardiology was consulted.   3/11: Blood pressure elevated at 170/60, restarting home amlodipine.  Hemoglobin decreased to 10.2 this morning with no evidence of bleeding.  TSH mildly elevated at 4.862, last recorded troponin at 283, BNP 1003. Cardene infusion has been discontinued. Echocardiogram pending.   Assessment and Plan: * Acute on chronic heart failure with preserved ejection fraction (HFpEF) (HCC) Patient with history of HFpEF, chronic MR and moderate pulmonary hypertension.  Continue IV Lasix as recommended by cardiology Cardiology is on board and case discussed Echo showing EF 60 to 65% with grade 2 diastolic dysfunction Can start p.o. diuresis from tomorrow if renal functions remain stable Continue input and output monitoring Continue daily weighing   Demand ischemia (HCC) Slightly increase troponin with no chest pain likely demand ischemia with significantly elevated blood pressure and acute on chronic HFpEF. Cardiology was consulted-suggesting conservative management Initially started on  heparin infusion which was discontinued by cardiology. Echocardiogram pending   Chronic kidney disease (CKD), stage III (moderate) (HCC) Seems like underlying CKD stage IIIb.  Slight increase in creatinine today.  So holding further IV diuresis. Monitor renal function closely   Essential hypertension Patient presented with significantly elevated blood pressure initially requiring Cardene infusion which was later discontinued. Blood pressure elevated this morning -Continuing Coreg and hydralazine -Cardiology started amlodipine and lisinopril which was initially held for concern of acute on chronic CHF. -We can restart home clonidine if needed   Physical deconditioning Have been seen by PT OT who recommended home health at discharge     Subjective:  Patient seen and examined at bedside this morning Admits to improvement in respiratory function Denies nausea vomiting abdominal pain or chest pain   Physical Exam: General.  Frail elderly lady, in no acute distress. Pulmonary.  Lungs clear bilaterally, normal respiratory effort. CV.  Regular rate and rhythm, positive murmur Abdomen.  Soft, nontender, nondistended, BS positive. CNS.  Alert and oriented .  No focal neurologic deficit. Extremities.  Trace LE edema, no cyanosis, pulses intact and symmetrical.   Data Reviewed: Prior data reviewed.   Family Communication: Discussed with daughter at bedside   Disposition: Status is: Inpatient Remains inpatient appropriate because: Severity of illness  Planned Discharge Destination: Home health    Vitals:   03/06/24 0631 03/06/24 0817 03/06/24 1204 03/06/24 1631  BP: (!) 163/56 (!) 172/70 (!) 148/55 (!) 154/57  Pulse: 88 85 84 87  Resp:      Temp:  98.3 F (36.8 C) 98 F (36.7 C) 98.4 F (36.9 C)  TempSrc:      SpO2:  95% 92% 95%  Weight:  Height:        Data Reviewed:    Latest Ref Rng & Units 03/06/2024    6:17 AM 03/05/2024    3:04 AM 03/04/2024    4:09 AM  BMP   Glucose 70 - 99 mg/dL 95  161  096   BUN 8 - 23 mg/dL 39  40  37   Creatinine 0.44 - 1.00 mg/dL 0.45  4.09  8.11   Sodium 135 - 145 mmol/L 139  138  141   Potassium 3.5 - 5.1 mmol/L 3.9  4.1  4.6   Chloride 98 - 111 mmol/L 108  109  108   CO2 22 - 32 mmol/L 21  19  19    Calcium 8.9 - 10.3 mg/dL 8.9  8.8  9.3        Latest Ref Rng & Units 03/06/2024    6:17 AM 03/05/2024    3:04 AM 03/04/2024    4:09 AM  CBC  WBC 4.0 - 10.5 K/uL 7.0  7.1  7.7   Hemoglobin 12.0 - 15.0 g/dL 9.9  91.4  78.2   Hematocrit 36.0 - 46.0 % 30.3  31.0  35.5   Platelets 150 - 400 K/uL 210  224  228      Time spent: 45 minutes  Author: Loyce Dys, MD 03/06/2024 5:36 PM  For on call review [email or web address].

## 2024-03-06 NOTE — Progress Notes (Signed)
 [facility] CLINIC CARDIOLOGY PROGRESS NOTE       Patient ID: Daisy Mora MRN: [id number] DOB/AGE: 1926/09/16 89 y.o.  Admit date: 03/04/2024 Referring Physician Dr Chipper Herb Primary Physician Luciana Axe, NP Primary Cardiologist Gwen Pounds (retired) Reason for Consultation elevated trops  HPI: IANNA Mora is a 89 y.o. female with medical history significant of chronic HFpEF, moderate MR, moderate pulmonary hypertension, refractory HTN, CKD stage IIIb, hypothyroidism, presented with worsening of shortness of breath and leg swelling.   Interval history: -Patient seen and examined this AM, resting comfortably in bed.  -Denies any CP.  Shortness of breath continues to improve. -BP still elevated, denies headache.  -Only trace LE edema noted on exam today.   Review of systems complete and found to be negative unless listed above     Past Medical History:  Diagnosis Date   Hypertension     Past Surgical History:  Procedure Laterality Date   ABDOMINAL HYSTERECTOMY     APPENDECTOMY     BREAST SURGERY     INTRAMEDULLARY (IM) NAIL INTERTROCHANTERIC Left 12/23/2021   Procedure: INTRAMEDULLARY (IM) NAIL INTERTROCHANTRIC;  Surgeon: Juanell Fairly, MD;  Location: [facility] ORS;  Service: Orthopedics;  Laterality: Left;    Medications Prior to Admission  Medication Sig Dispense Refill Last Dose/Taking   acetaminophen (TYLENOL) 325 MG tablet Take 1-2 tablets (325-650 mg total) by mouth every 6 (six) hours as needed for mild pain (pain score 1-3 or temp > 100.5). 30 tablet 0 Taking As Needed   amLODipine (NORVASC) 10 MG tablet Take 10 mg by mouth daily.   03/03/2024   carvedilol (COREG) 25 MG tablet Take 25 mg by mouth 2 (two) times daily with a meal.   03/03/2024   Cholecalciferol 50 MCG (2000 UT) CAPS Take 1 capsule by mouth daily.   Taking   cloNIDine (CATAPRES) 0.1 MG tablet Take 0.1 mg by mouth 2 (two) times daily.   03/03/2024   cyanocobalamin 500 MCG tablet Take 500 mcg by mouth  daily.   03/03/2024   levothyroxine (SYNTHROID) 25 MCG tablet Take 25 mcg by mouth daily before breakfast.   03/03/2024   lisinopril (ZESTRIL) 40 MG tablet Take 40 mg by mouth daily.   03/03/2024   Multiple Vitamin (MULTIVITAMIN) capsule Take 1 capsule by mouth daily.   03/03/2024   omeprazole (PRILOSEC OTC) 20 MG tablet Take 20 mg by mouth daily.   03/03/2024   polyethylene glycol (MIRALAX / GLYCOLAX) 17 g packet Take 17 g by mouth daily as needed for mild constipation. 14 each 0 Past Week   RESTASIS 0.05 % ophthalmic emulsion Place 1 drop into both eyes 2 (two) times daily.   Taking   solifenacin (VESICARE) 5 MG tablet Take 5 mg by mouth daily.   03/03/2024   Turmeric 400 MG CAPS Take by mouth.   03/03/2024   Omega-3 Fatty Acids (FISH OIL PO) Take by mouth daily. (Patient not taking: Reported on 03/04/2024)   Not Taking   Social History   Socioeconomic History   Marital status: Widowed    Spouse name: Not on file   Number of children: Not on file   Years of education: Not on file   Highest education level: Not on file  Occupational History   Not on file  Tobacco Use   Smoking status: Never   Smokeless tobacco: Never  Vaping Use   Vaping status: Never Used  Substance and Sexual Activity   Alcohol use: No   Drug use: No  Sexual activity: Not Currently  Other Topics Concern   Not on file  Social History Narrative   Not on file   Social Drivers of Health   Financial Resource Strain: Low Risk  (06/12/2019)   Received from [facility]   Overall Financial Resource Strain (CARDIA)    Difficulty of Paying Living Expenses: Not hard at all  Food Insecurity: No Food Insecurity (03/05/2024)   Hunger Vital Sign    Worried About Running Out of Food in the Last Year: Never true    Ran Out of Food in the Last Year: Never true  Transportation Needs: No Transportation Needs (03/05/2024)   PRAPARE - Administrator, Civil Service (Medical): No    Lack of Transportation  (Non-Medical): No  Physical Activity: Sufficiently Active (06/12/2019)   Received from [facility]   Exercise Vital Sign    Days of Exercise per Week: 6 days    Minutes of Exercise per Session: 30 min  Stress: Not on file  Social Connections: Socially Isolated (03/05/2024)   Social Connection and Isolation Panel [NHANES]    Frequency of Communication with Friends and Family: Never    Frequency of Social Gatherings with Friends and Family: Never    Attends Religious Services: Never    Database administrator or Organizations: No    Attends Banker Meetings: Never    Marital Status: Widowed  Intimate Partner Violence: Not At Risk (03/05/2024)   Humiliation, Afraid, Rape, and Kick questionnaire    Fear of Current or Ex-Partner: No    Emotionally Abused: No    Physically Abused: No    Sexually Abused: No    History reviewed. No pertinent family history.   Vitals:   03/06/24 0048 03/06/24 0445 03/06/24 0631 03/06/24 0817  BP: (!) 161/49 (!) 168/64 (!) 163/56 (!) 172/70  Pulse: 93 90 88 85  Resp:  18    Temp:  98.9 F (37.2 C)  98.3 F (36.8 C)  TempSrc:      SpO2: 91% 95%  95%  Weight:      Height:        PHYSICAL EXAM General: awake, well nourished, in no acute distress. HEENT: Normocephalic and atraumatic. Neck: No JVD.  Lungs: Normal respiratory effort. Decreased breath sounds at bases. No wheezes, crackles, rhonchi.  Heart: HRRR. Normal S1 and S2 without gallops or murmurs.  Abdomen: Non-distended appearing.  Msk: Normal strength and tone for age. Extremities: Warm and well perfused. No clubbing, cyanosis.  Trace edema.  Neuro: Alert and oriented X 3. Psych: Answers questions appropriately.   Labs: Basic Metabolic Panel: Recent Labs    03/05/24 0304 03/06/24 0617  NA 138 139  K 4.1 3.9  CL 109 108  CO2 19* 21*  GLUCOSE 102* 95  BUN 40* 39*  CREATININE 1.62* 1.60*  CALCIUM 8.8* 8.9   Liver Function Tests: No results for  input(s): "AST", "ALT", "ALKPHOS", "BILITOT", "PROT", "ALBUMIN" in the last 72 hours. No results for input(s): "LIPASE", "AMYLASE" in the last 72 hours. CBC: Recent Labs    03/05/24 0304 03/06/24 0617  WBC 7.1 7.0  HGB 10.2* 9.9*  HCT 31.0* 30.3*  MCV 92.3 92.9  PLT 224 210   Cardiac Enzymes: Recent Labs    03/04/24 0409 03/04/24 0553 03/04/24 1000  TROPONINIHS 96* 244* 283*   BNP: Recent Labs    03/04/24 0409  BNP 1,003.2*   D-Dimer: No results for input(s): "DDIMER" in the  last 72 hours. Hemoglobin A1C: No results for input(s): "HGBA1C" in the last 72 hours. Fasting Lipid Panel: No results for input(s): "CHOL", "HDL", "LDLCALC", "TRIG", "CHOLHDL", "LDLDIRECT" in the last 72 hours. Thyroid Function Tests: Recent Labs    03/04/24 0553  TSH 4.862*   Anemia Panel: No results for input(s): "VITAMINB12", "FOLATE", "FERRITIN", "TIBC", "IRON", "RETICCTPCT" in the last 72 hours.   Radiology: DG Chest 1 View Result Date: 03/05/2024 CLINICAL DATA:  Congestive heart failure. EXAM: CHEST  1 VIEW COMPARISON:  03/04/2024 FINDINGS: Stable cardiac enlargement. Stable small bilateral pleural effusions. Degree of pulmonary interstitial edema may be mildly improved. No focal airspace consolidation or pneumothorax. IMPRESSION: Mild improvement in pulmonary interstitial edema. Stable small bilateral pleural effusions. Stable cardiac enlargement. Electronically Signed   By: Irish Lack M.D.   On: 03/05/2024 11:10   DG Chest Port 1 View Result Date: 03/04/2024 CLINICAL DATA:  [id number] with shortness of breath onset yesterday. EXAM: PORTABLE CHEST 1 VIEW COMPARISON:  Portable chest 12/22/2021 FINDINGS: The patient is rotated to the right exaggerating the mediastinum. The aorta is tortuous and calcified. The heart is moderately enlarged. The mitral ring is heavily calcified. There is perihilar vascular congestion, mild generalized interstitial consolidation consistent with edema and small  pleural effusions. Findings consistent with CHF or fluid overload. Scattered bilateral perihilar opacities are also noted, could be due to ground-glass edema or pneumonia. No other focal infiltrate is seen. There is slight thickening in the right horizontal fissure most likely due to fluid in the fissure. Additional right perihilar linear scar-like opacity. There is levoscoliosis and degenerative change of the spine with osteopenia. No new osseous findings. IMPRESSION: 1. Cardiomegaly with perihilar vascular congestion, mild generalized interstitial consolidation consistent with edema and small pleural effusions. Findings consistent with CHF or fluid overload. 2. Scattered bilateral perihilar opacities could be due to ground-glass edema or pneumonia. 3. Aortic atherosclerosis. 4. Osteopenia and degenerative change. Electronically Signed   By: Almira Bar M.D.   On: 03/04/2024 04:26    ECHO pending  TELEMETRY reviewed by me [facility]) 03/06/2024 : NSR rate 80s  EKG reviewed by me: NSR with PVCs  Data reviewed by me [facility]) 03/06/2024: last 24h vitals tele labs imaging I/O provider notes  Principal Problem:   Acute on chronic heart failure with preserved ejection fraction (HFpEF) (HCC) Active Problems:   Essential hypertension   CHF (congestive heart failure) (HCC)   Demand ischemia (HCC)   Chronic kidney disease (CKD), stage III (moderate) (HCC)   Physical deconditioning    ASSESSMENT AND PLAN:   Acute on chronic HFpEF decompensation Chronic MR Chronic moderate pulmonary hypertension -Transition to p.o. Lasix 20 mg tomorrow. -Monitor kidney function -increase imdur and hydralazine. Continue coreg and amlodipine.    Demand ischemia -recommend conservative management -no plan for invasive cardiac work-up -no need to further trend trops -maintain on telemetry -suspect trops elevated due to HTN emergency, DC heparin   HTN emergency -Increase Coreg to 12.5 mg twice daily.  Continue  amlodipine 10 mg daily, hydralazine 50 mg 3 times daily, Imdur 60 mg daily, lisinopril 40 mg daily. -Off nicardipine gtt   This patient's plan of care was discussed and created with Dr. Melton Alar and she is in agreement.    Signed: Gale Journey, PA-C 03/06/2024, 11:23 AM [facility] Cardiology

## 2024-03-07 DIAGNOSIS — I5033 Acute on chronic diastolic (congestive) heart failure: Secondary | ICD-10-CM | POA: Diagnosis not present

## 2024-03-07 LAB — BASIC METABOLIC PANEL
Anion gap: 8 (ref 5–15)
BUN: 49 mg/dL — ABNORMAL HIGH (ref 8–23)
CO2: 21 mmol/L — ABNORMAL LOW (ref 22–32)
Calcium: 8.7 mg/dL — ABNORMAL LOW (ref 8.9–10.3)
Chloride: 109 mmol/L (ref 98–111)
Creatinine, Ser: 2.04 mg/dL — ABNORMAL HIGH (ref 0.44–1.00)
GFR, Estimated: 22 mL/min — ABNORMAL LOW (ref >60)
Glucose, Bld: 101 mg/dL — ABNORMAL HIGH (ref 70–99)
Potassium: 3.8 mmol/L (ref 3.5–5.1)
Sodium: 138 mmol/L (ref 135–145)

## 2024-03-07 LAB — CBC WITH DIFFERENTIAL/PLATELET
Abs Immature Granulocytes: 0.03 10*3/uL (ref 0.00–0.07)
Basophils Absolute: 0.1 10*3/uL (ref 0.0–0.1)
Basophils Relative: 1 %
Eosinophils Absolute: 0.2 10*3/uL (ref 0.0–0.5)
Eosinophils Relative: 2 %
HCT: 29.3 % — ABNORMAL LOW (ref 36.0–46.0)
Hemoglobin: 9.8 g/dL — ABNORMAL LOW (ref 12.0–15.0)
Immature Granulocytes: 0 %
Lymphocytes Relative: 21 %
Lymphs Abs: 1.4 10*3/uL (ref 0.7–4.0)
MCH: 30.8 pg (ref 26.0–34.0)
MCHC: 33.4 g/dL (ref 30.0–36.0)
MCV: 92.1 fL (ref 80.0–100.0)
Monocytes Absolute: 1 10*3/uL (ref 0.1–1.0)
Monocytes Relative: 15 %
Neutro Abs: 4.2 10*3/uL (ref 1.7–7.7)
Neutrophils Relative %: 61 %
Platelets: 192 10*3/uL (ref 150–400)
RBC: 3.18 MIL/uL — ABNORMAL LOW (ref 3.87–5.11)
RDW: 13.3 % (ref 11.5–15.5)
WBC: 6.8 10*3/uL (ref 4.0–10.5)
nRBC: 0 % (ref 0.0–0.2)

## 2024-03-07 MED ORDER — FUROSEMIDE 20 MG PO TABS
20.0000 mg | ORAL_TABLET | Freq: Every day | ORAL | Status: DC
  Administered 2024-03-08: 20 mg via ORAL
  Filled 2024-03-07: qty 1

## 2024-03-07 NOTE — Progress Notes (Signed)
 PT Cancellation Note  Patient Details Name: Daisy Mora MRN: [id number] DOB: 1926-10-01   Cancelled Treatment:     PT attempt, 2nd attempt today. Pt reports just working with OT and requested not to get up or do more activity." Can we just wait till tomorrow?"    Rushie Chestnut 03/07/2024, 3:28 PM

## 2024-03-07 NOTE — Progress Notes (Signed)
 Progress Note   Patient: Daisy Mora ION:[id number] DOB: June 22, 1926 DOA: 03/04/2024     3 DOS: the patient was seen and examined on 03/07/2024      Brief hospital course: Taken from H&P.    Daisy Mora is a 89 y.o. female with medical history significant of chronic HFpEF, moderate MR, moderate pulmonary hypertension, refractory HTN, CKD stage IIIb, hypothyroidism, presented with worsening of shortness of breath and leg swelling.  She was also experiencing exertional dyspnea and orthopnea.   On presentation she had significantly elevated blood pressure at 220/90. Chest x-ray showed pulmonary Congestion and cardiomegaly, blood work showed hemoglobin 11.8, WBC 7.7, creatinine 1.4 compared to baseline 1.3-1.4, bicarb 19K 4.6.  Troponin trending 96> 244.  EKG showed frequent PVCs, sinus rhythm no acute ST changes.    Patient was started on IV diuresis and heparin infusion in ED.  Cardiology was consulted.   3/11: Blood pressure elevated at 170/60, restarting home amlodipine.  Hemoglobin decreased to 10.2 this morning with no evidence of bleeding.  TSH mildly elevated at 4.862, last recorded troponin at 283, BNP 1003. Cardene infusion has been discontinued. Echocardiogram pending.   Assessment and Plan: * Acute on chronic heart failure with preserved ejection fraction (HFpEF) (HCC) Patient with history of HFpEF, chronic MR and moderate pulmonary hypertension.  Cardiologist plans to start oral Lasix tomorrow Plan of care discussed with cardiologist Echo showing EF 60 to 65% with grade 2 diastolic dysfunction Monitor renal function closely Continue input and output monitoring Continue daily weighing   Demand ischemia (HCC) Slightly increase troponin with no chest pain likely demand ischemia with significantly elevated blood pressure and acute on chronic HFpEF. Cardiology was consulted-suggesting conservative management Initially started on heparin infusion which was discontinued by  cardiology. Echocardiogram pending   Chronic kidney disease (CKD), stage III (moderate) (HCC) Seems like underlying CKD stage IIIb.  Slight increase in creatinine today. Holding Lasix due to worsening renal function Monitor renal function closely   Essential hypertension Patient presented with significantly elevated blood pressure initially requiring Cardene infusion which was later discontinued. Blood pressure elevated this morning -Continuing Coreg and hydralazine -Cardiology started amlodipine and lisinopril which was initially held for concern of acute on chronic CHF. -We can restart home clonidine if needed   Physical deconditioning Have been seen by PT OT who recommended home health at discharge     Subjective:  Patient seen and examined at bedside this morning Admits to improvement in respiratory function Denies chest pain nausea vomiting abdominal pain   Physical Exam: General.  Frail elderly lady, in no acute distress. Pulmonary.  Lungs clear bilaterally, normal respiratory effort. CV.  Regular rate and rhythm, positive murmur Abdomen.  Soft, nontender, nondistended, BS positive. CNS.  Alert and oriented .  No focal neurologic deficit. Extremities.  Trace LE edema, no cyanosis, pulses intact and symmetrical.   Data Reviewed: Prior data reviewed.   Family Communication: Discussed with daughter at bedside   Disposition: Status is: Inpatient Remains inpatient appropriate because: Severity of illness  Planned Discharge Destination: Home health    Data Reviewed:    Latest Ref Rng & Units 03/07/2024    5:55 AM 03/06/2024    6:17 AM 03/05/2024    3:04 AM  CBC  WBC 4.0 - 10.5 K/uL 6.8  7.0  7.1   Hemoglobin 12.0 - 15.0 g/dL 9.8  9.9  24.4   Hematocrit 36.0 - 46.0 % 29.3  30.3  31.0   Platelets 150 - 400  K/uL 192  210  224        Latest Ref Rng & Units 03/07/2024    5:55 AM 03/06/2024    7:02 PM 03/06/2024    6:17 AM  BMP  Glucose 70 - 99 mg/dL 811  914  95    BUN 8 - 23 mg/dL 49  45  39   Creatinine 0.44 - 1.00 mg/dL 7.82  9.56  2.13   Sodium 135 - 145 mmol/L 138  137  139   Potassium 3.5 - 5.1 mmol/L 3.8  3.8  3.9   Chloride 98 - 111 mmol/L 109  107  108   CO2 22 - 32 mmol/L 21  20  21    Calcium 8.9 - 10.3 mg/dL 8.7  8.5  8.9      Vitals:   03/07/24 0500 03/07/24 0927 03/07/24 1126 03/07/24 1628  BP:  (!) 112/46 (!) 139/56 (!) 146/53  Pulse:  72 76 76  Resp:    17  Temp:  98.4 F (36.9 C) 98.8 F (37.1 C) 98.4 F (36.9 C)  TempSrc:    Oral  SpO2:  95% 93% 93%  Weight: 59.5 kg     Height:         Author: Loyce Dys, MD 03/07/2024 5:26 PM  For on call review [email or web address].

## 2024-03-07 NOTE — Care Management Important Message (Signed)
 Important Message  Patient Details  Name: Daisy Mora MRN: [id number] Date of Birth: [birth year]   Important Message Given:  Yes - Medicare IM     Cristela Blue, CMA 03/07/2024, 10:29 AM

## 2024-03-07 NOTE — Plan of Care (Signed)
   Problem: Education: Goal: Knowledge of General Education information will improve Description Including pain rating scale, medication(s)/side effects and non-pharmacologic comfort measures Outcome: Progressing

## 2024-03-07 NOTE — Plan of Care (Signed)

## 2024-03-07 NOTE — Progress Notes (Signed)
 Occupational Therapy Treatment Patient Details Name: Daisy Mora MRN: [id number] DOB: May 05, 1926 Today's Date: 03/07/2024   History of present illness Pt is a 89 y.o. female with medical history significant of chronic HFpEF, moderate MR, moderate pulmonary hypertension, refractory HTN, CKD stage IIIb, hypothyroidism, presented with worsening of shortness of breath and leg swelling. MD assessment includes: acute CHF, NSTEMI, HTN emergency, and deconditioning.   OT comments  Upon entering the room, pt supine in bed and agreeable to OT intervention. Pt requests assistance with toileting needs and then wanting to take a nap to rest. Pt performed bed mobility with supervision. Sit <>stand with RW and CGA. Pt initially displaying R lateral lean when standing and needing min A for safety but progresses to min guard with ambulation to bathroom. Pt able to void on toilet and performs hygiene and clothing management with CGA. Pt standing at sink for hand hygiene, washing face, and to brush teeth before returning to bed. Call bell and all needed items within reach upon exiting the room.       If plan is discharge home, recommend the following:  A little help with walking and/or transfers;A little help with bathing/dressing/bathroom;Assistance with cooking/housework;Assist for transportation;Help with stairs or ramp for entrance   Equipment Recommendations  Other (comment) (RW)       Precautions / Restrictions Precautions Precautions: Fall       Mobility Bed Mobility Overal bed mobility: Modified Independent Bed Mobility: Supine to Sit, Sit to Supine     Supine to sit: Supervision Sit to supine: Supervision        Transfers Overall transfer level: Needs assistance Equipment used: Rolling walker (2 wheels) Transfers: Sit to/from Stand Sit to Stand: Contact guard assist                 Balance Overall balance assessment: Needs assistance Sitting-balance support: Feet  unsupported Sitting balance-Leahy Scale: Normal     Standing balance support: Bilateral upper extremity supported, During functional activity, Reliant on assistive device for balance Standing balance-Leahy Scale: Good                             ADL either performed or assessed with clinical judgement   ADL Overall ADL's : Needs assistance/impaired     Grooming: Wash/dry hands;Wash/dry face;Brushing hair;Standing;Contact guard assist                   Toilet Transfer: Contact guard assist;Comfort height toilet;Rolling walker (2 wheels)   Toileting- Clothing Manipulation and Hygiene: Sitting/lateral lean       Functional mobility during ADLs: Contact guard assist;Rolling walker (2 wheels);Cueing for safety      Extremity/Trunk Assessment Upper Extremity Assessment Upper Extremity Assessment: Generalized weakness;Overall [facility] for tasks assessed   Lower Extremity Assessment Lower Extremity Assessment: Generalized weakness;Overall WFL for tasks assessed        Vision Patient Visual Report: No change from baseline           Communication Communication Communication: No apparent difficulties   Cognition Arousal: Alert Behavior During Therapy: WFL for tasks assessed/performed Cognition: No apparent impairments             OT - Cognition Comments: Pt a/o x4 throughout session                 Following commands: Intact        Cueing   Cueing Techniques: Verbal cues, Tactile cues, Visual cues  Exercises              Pertinent Vitals/ Pain       Pain Assessment Pain Assessment: No/denies pain         Frequency  Min 1X/week        Progress Toward Goals  OT Goals(current goals can now be found in the care plan section)  Progress towards OT goals: Progressing toward goals      AM-PAC OT "6 Clicks" Daily Activity     Outcome Measure   Help from another person eating meals?: None Help from another person taking care  of personal grooming?: A Lot Help from another person toileting, which includes using toliet, bedpan, or urinal?: A Little Help from another person bathing (including washing, rinsing, drying)?: A Lot Help from another person to put on and taking off regular upper body clothing?: None Help from another person to put on and taking off regular lower body clothing?: A Lot 6 Click Score: 17    End of Session Equipment Utilized During Treatment: Rolling walker (2 wheels)  OT Visit Diagnosis: Unsteadiness on feet (R26.81);Other abnormalities of gait and mobility (R26.89);Muscle weakness (generalized) (M62.81)   Activity Tolerance Patient tolerated treatment well   Patient Left in bed;with call bell/phone within reach;with family/visitor present   Nurse Communication Mobility status        Time: 1455-1510 OT Time Calculation (min): 15 min  Charges: OT General Charges $OT Visit: 1 Visit OT Treatments $Self Care/Home Management : 8-22 mins  Jackquline Denmark, MS, OTR/L , CBIS ascom [phone]  03/07/24, 3:57 PM

## 2024-03-07 NOTE — Progress Notes (Signed)
 [facility] CLINIC CARDIOLOGY PROGRESS NOTE       Patient ID: Daisy Mora MRN: [id number] DOB/AGE: [birth year] 89 y.o.  Admit date: 03/04/2024 Referring Physician Dr Chipper Herb Primary Physician Luciana Axe, NP Primary Cardiologist Gwen Pounds (retired) Reason for Consultation elevated trops  HPI: Daisy Mora is a 89 y.o. female with medical history significant of chronic HFpEF, moderate MR, moderate pulmonary hypertension, refractory HTN, CKD stage IIIb, hypothyroidism, presented with worsening of shortness of breath and leg swelling.   Interval history: -Patient seen and examined this AM, resting comfortably in bed.  -Denies any CP.  Also denies any shortness of breath today -BP significantly improved. -No appreciable lower extremity edema.  Review of systems complete and found to be negative unless listed above     Past Medical History:  Diagnosis Date   Hypertension     Past Surgical History:  Procedure Laterality Date   ABDOMINAL HYSTERECTOMY     APPENDECTOMY     BREAST SURGERY     INTRAMEDULLARY (IM) NAIL INTERTROCHANTERIC Left 12/23/2021   Procedure: INTRAMEDULLARY (IM) NAIL INTERTROCHANTRIC;  Surgeon: Juanell Fairly, MD;  Location: [facility] ORS;  Service: Orthopedics;  Laterality: Left;    Medications Prior to Admission  Medication Sig Dispense Refill Last Dose/Taking   acetaminophen (TYLENOL) 325 MG tablet Take 1-2 tablets (325-650 mg total) by mouth every 6 (six) hours as needed for mild pain (pain score 1-3 or temp > 100.5). 30 tablet 0 Taking As Needed   amLODipine (NORVASC) 10 MG tablet Take 10 mg by mouth daily.   03/03/2024   carvedilol (COREG) 25 MG tablet Take 25 mg by mouth 2 (two) times daily with a meal.   03/03/2024   Cholecalciferol 50 MCG (2000 UT) CAPS Take 1 capsule by mouth daily.   Taking   cloNIDine (CATAPRES) 0.1 MG tablet Take 0.1 mg by mouth 2 (two) times daily.   03/03/2024   cyanocobalamin 500 MCG tablet Take 500 mcg by mouth daily.   03/03/2024    levothyroxine (SYNTHROID) 25 MCG tablet Take 25 mcg by mouth daily before breakfast.   03/03/2024   lisinopril (ZESTRIL) 40 MG tablet Take 40 mg by mouth daily.   03/03/2024   Multiple Vitamin (MULTIVITAMIN) capsule Take 1 capsule by mouth daily.   03/03/2024   omeprazole (PRILOSEC OTC) 20 MG tablet Take 20 mg by mouth daily.   03/03/2024   polyethylene glycol (MIRALAX / GLYCOLAX) 17 g packet Take 17 g by mouth daily as needed for mild constipation. 14 each 0 Past Week   RESTASIS 0.05 % ophthalmic emulsion Place 1 drop into both eyes 2 (two) times daily.   Taking   solifenacin (VESICARE) 5 MG tablet Take 5 mg by mouth daily.   03/03/2024   Turmeric 400 MG CAPS Take by mouth.   03/03/2024   Omega-3 Fatty Acids (FISH OIL PO) Take by mouth daily. (Patient not taking: Reported on 03/04/2024)   Not Taking   Social History   Socioeconomic History   Marital status: Widowed    Spouse name: Not on file   Number of children: Not on file   Years of education: Not on file   Highest education level: Not on file  Occupational History   Not on file  Tobacco Use   Smoking status: Never   Smokeless tobacco: Never  Vaping Use   Vaping status: Never Used  Substance and Sexual Activity   Alcohol use: No   Drug use: No   Sexual activity: Not Currently  Other Topics Concern   Not on file  Social History Narrative   Not on file   Social Drivers of Health   Financial Resource Strain: Low Risk  (06/12/2019)   Received from [facility]   Overall Financial Resource Strain (CARDIA)    Difficulty of Paying Living Expenses: Not hard at all  Food Insecurity: No Food Insecurity (03/05/2024)   Hunger Vital Sign    Worried About Running Out of Food in the Last Year: Never true    Ran Out of Food in the Last Year: Never true  Transportation Needs: No Transportation Needs (03/05/2024)   PRAPARE - Administrator, Civil Service (Medical): No    Lack of Transportation (Non-Medical): No   Physical Activity: Sufficiently Active (06/12/2019)   Received from [facility]   Exercise Vital Sign    Days of Exercise per Week: 6 days    Minutes of Exercise per Session: 30 min  Stress: Not on file  Social Connections: Socially Isolated (03/05/2024)   Social Connection and Isolation Panel [NHANES]    Frequency of Communication with Friends and Family: Never    Frequency of Social Gatherings with Friends and Family: Never    Attends Religious Services: Never    Database administrator or Organizations: No    Attends Banker Meetings: Never    Marital Status: Widowed  Intimate Partner Violence: Not At Risk (03/05/2024)   Humiliation, Afraid, Rape, and Kick questionnaire    Fear of Current or Ex-Partner: No    Emotionally Abused: No    Physically Abused: No    Sexually Abused: No    History reviewed. No pertinent family history.   Vitals:   03/06/24 2340 03/07/24 0426 03/07/24 0500 03/07/24 0927  BP: (!) 139/51 (!) 163/61  (!) 112/46  Pulse: 79 78  72  Resp: 18 18    Temp: 99.2 F (37.3 C) 98 F (36.7 C)  98.4 F (36.9 C)  TempSrc:      SpO2: 96% 93%  95%  Weight:   59.5 kg   Height:        PHYSICAL EXAM General: awake, well nourished, in no acute distress. HEENT: Normocephalic and atraumatic. Neck: No JVD.  Lungs: Normal respiratory effort.  CTAB. Heart: HRRR. Normal S1 and S2 without gallops or murmurs.  Abdomen: Non-distended appearing.  Msk: Normal strength and tone for age. Extremities: Warm and well perfused. No clubbing, cyanosis.  No edema.  Neuro: Alert and oriented X 3. Psych: Answers questions appropriately.   Labs: Basic Metabolic Panel: Recent Labs    03/06/24 1902 03/07/24 0555  NA 137 138  K 3.8 3.8  CL 107 109  CO2 20* 21*  GLUCOSE 140* 101*  BUN 45* 49*  CREATININE 2.02* 2.04*  CALCIUM 8.5* 8.7*   Liver Function Tests: No results for input(s): "AST", "ALT", "ALKPHOS", "BILITOT", "PROT", "ALBUMIN" in the  last 72 hours. No results for input(s): "LIPASE", "AMYLASE" in the last 72 hours. CBC: Recent Labs    03/06/24 0617 03/07/24 0555  WBC 7.0 6.8  NEUTROABS  --  4.2  HGB 9.9* 9.8*  HCT 30.3* 29.3*  MCV 92.9 92.1  PLT 210 192   Cardiac Enzymes: No results for input(s): "CKTOTAL", "CKMB", "CKMBINDEX", "TROPONINIHS" in the last 72 hours.  BNP: No results for input(s): "BNP" in the last 72 hours.  D-Dimer: No results for input(s): "DDIMER" in the last 72 hours. Hemoglobin A1C: No results for input(s): "  HGBA1C" in the last 72 hours. Fasting Lipid Panel: No results for input(s): "CHOL", "HDL", "LDLCALC", "TRIG", "CHOLHDL", "LDLDIRECT" in the last 72 hours. Thyroid Function Tests: No results for input(s): "TSH", "T4TOTAL", "T3FREE", "THYROIDAB" in the last 72 hours.  Invalid input(s): "FREET3"  Anemia Panel: No results for input(s): "VITAMINB12", "FOLATE", "FERRITIN", "TIBC", "IRON", "RETICCTPCT" in the last 72 hours.   Radiology: ECHOCARDIOGRAM COMPLETE Result Date: 03/06/2024    ECHOCARDIOGRAM REPORT   Patient Name:   JAHNA LIEBERT Date of Exam: 03/05/2024 Medical Rec #:  [id number]        Height:       63.0 in Accession #:    [id number]       Weight:       140.0 lb Date of Birth:  June 02, 1926         BSA:          1.662 m Patient Age:    89 years         BP:           168/65 mmHg Patient Gender: F                HR:           93 bpm. Exam Location:  [facility] Procedure: 2D Echo, Cardiac Doppler and Color Doppler (Both Spectral and Color            Flow Doppler were utilized during procedure). Indications:     CHF- Acute Diastolic I50.31  History:         Patient has no prior history of Echocardiogram examinations.                  CHF, CKD, stage 3; Risk Factors:Hypertension.  Sonographer:     Lucendia Herrlich RCS Referring Phys:  [id number] Etheleen Valtierra Diagnosing Phys: Rozell Searing Custovic IMPRESSIONS  1. Left ventricular ejection fraction, by estimation, is 60 to 65%. The left ventricle has  normal function. The left ventricle has no regional wall motion abnormalities. Left ventricular diastolic parameters are consistent with Grade II diastolic dysfunction (pseudonormalization).  2. Right ventricular systolic function is normal. The right ventricular size is normal.  3. Left atrial size was severely dilated.  4. The mitral valve is normal in structure. Mild mitral valve regurgitation. No evidence of mitral stenosis.  5. The aortic valve is normal in structure. Aortic valve regurgitation is not visualized. No aortic stenosis is present.  6. The inferior vena cava is normal in size with greater than 50% respiratory variability, suggesting right atrial pressure of 3 mmHg. FINDINGS  Left Ventricle: Left ventricular ejection fraction, by estimation, is 60 to 65%. The left ventricle has normal function. The left ventricle has no regional wall motion abnormalities. The left ventricular internal cavity size was normal in size. There is  no left ventricular hypertrophy. Left ventricular diastolic parameters are consistent with Grade II diastolic dysfunction (pseudonormalization). Right Ventricle: The right ventricular size is normal. No increase in right ventricular wall thickness. Right ventricular systolic function is normal. Left Atrium: Left atrial size was severely dilated. Right Atrium: Right atrial size was normal in size. Pericardium: There is no evidence of pericardial effusion. Mitral Valve: The mitral valve is normal in structure. Mild mitral valve regurgitation. No evidence of mitral valve stenosis. MV peak gradient, 9.5 mmHg. The mean mitral valve gradient is 4.0 mmHg. Tricuspid Valve: The tricuspid valve is normal in structure. Tricuspid valve regurgitation is trivial. Aortic Valve: The aortic valve is normal in structure. Aortic  valve regurgitation is not visualized. No aortic stenosis is present. Aortic valve peak gradient measures 11.2 mmHg. Pulmonic Valve: The pulmonic valve was normal in  structure. Pulmonic valve regurgitation is not visualized. No evidence of pulmonic stenosis. Aorta: The aortic root is normal in size and structure. Venous: The inferior vena cava is normal in size with greater than 50% respiratory variability, suggesting right atrial pressure of 3 mmHg. IAS/Shunts: No atrial level shunt detected by color flow Doppler.  LEFT VENTRICLE PLAX 2D LVIDd:         3.12 cm   Diastology LVIDs:         1.94 cm   LV e' medial:    3.58 cm/s LV PW:         1.20 cm   LV E/e' medial:  22.5 LV IVS:        1.03 cm   LV e' lateral:   3.29 cm/s LVOT diam:     2.00 cm   LV E/e' lateral: 24.5 LV SV:         79 LV SV Index:   47 LVOT Area:     3.14 cm  RIGHT VENTRICLE             IVC RV S prime:     19.50 cm/s  IVC diam: 2.17 cm TAPSE (M-mode): 2.9 cm LEFT ATRIUM             Index        RIGHT ATRIUM           Index LA diam:        5.40 cm 3.25 cm/m   RA Area:     15.50 cm LA Vol (A2C):   76.1 ml 45.77 ml/m  RA Volume:   39.20 ml  23.59 ml/m LA Vol (A4C):   91.9 ml 55.28 ml/m LA Biplane Vol: 91.0 ml 54.76 ml/m  AORTIC VALVE AV Area (Vmax): 2.52 cm AV Vmax:        167.00 cm/s AV Peak Grad:   11.2 mmHg LVOT Vmax:      134.00 cm/s LVOT Vmean:     90.400 cm/s LVOT VTI:       0.250 m  AORTA Ao Root diam: 3.10 cm Ao Asc diam:  3.70 cm MITRAL VALVE                TRICUSPID VALVE MV Area (PHT): 4.10 cm     TR Peak grad:   8.3 mmHg MV Area VTI:   2.12 cm     TR Vmax:        144.00 cm/s MV Peak grad:  9.5 mmHg MV Mean grad:  4.0 mmHg     SHUNTS MV Vmax:       1.54 m/s     Systemic VTI:  0.25 m MV Vmean:      88.4 cm/s    Systemic Diam: 2.00 cm MV Decel Time: 185 msec MV E velocity: 80.50 cm/s MV A velocity: 170.00 cm/s MV E/A ratio:  0.47 Sabina Custovic Electronically signed by Clotilde Dieter Signature Date/Time: 03/06/2024/1:37:23 PM    Final    DG Chest 1 View Result Date: 03/05/2024 CLINICAL DATA:  Congestive heart failure. EXAM: CHEST  1 VIEW COMPARISON:  03/04/2024 FINDINGS: Stable cardiac  enlargement. Stable small bilateral pleural effusions. Degree of pulmonary interstitial edema may be mildly improved. No focal airspace consolidation or pneumothorax. IMPRESSION: Mild improvement in pulmonary interstitial edema. Stable small bilateral pleural effusions. Stable cardiac enlargement. Electronically  Signed   By: Irish Lack M.D.   On: 03/05/2024 11:10   DG Chest Port 1 View Result Date: 03/04/2024 CLINICAL DATA:  [id number] with shortness of breath onset yesterday. EXAM: PORTABLE CHEST 1 VIEW COMPARISON:  Portable chest 12/22/2021 FINDINGS: The patient is rotated to the right exaggerating the mediastinum. The aorta is tortuous and calcified. The heart is moderately enlarged. The mitral ring is heavily calcified. There is perihilar vascular congestion, mild generalized interstitial consolidation consistent with edema and small pleural effusions. Findings consistent with CHF or fluid overload. Scattered bilateral perihilar opacities are also noted, could be due to ground-glass edema or pneumonia. No other focal infiltrate is seen. There is slight thickening in the right horizontal fissure most likely due to fluid in the fissure. Additional right perihilar linear scar-like opacity. There is levoscoliosis and degenerative change of the spine with osteopenia. No new osseous findings. IMPRESSION: 1. Cardiomegaly with perihilar vascular congestion, mild generalized interstitial consolidation consistent with edema and small pleural effusions. Findings consistent with CHF or fluid overload. 2. Scattered bilateral perihilar opacities could be due to ground-glass edema or pneumonia. 3. Aortic atherosclerosis. 4. Osteopenia and degenerative change. Electronically Signed   By: Almira Bar M.D.   On: 03/04/2024 04:26    ECHO as above all  TELEMETRY reviewed by me [facility]) 03/07/2024 : NSR rate 80s  EKG reviewed by me: NSR with PVCs  Data reviewed by me [facility]) 03/07/2024: last 24h vitals tele labs imaging I/O  provider notes  Principal Problem:   Acute on chronic heart failure with preserved ejection fraction (HFpEF) (HCC) Active Problems:   Essential hypertension   CHF (congestive heart failure) (HCC)   Demand ischemia (HCC)   Chronic kidney disease (CKD), stage III (moderate) (HCC)   Physical deconditioning    ASSESSMENT AND PLAN:   Acute on chronic HFpEF decompensation Chronic MR Chronic moderate pulmonary hypertension -Transition to p.o. Lasix 20 mg tomorrow. -Monitor kidney function -Continue imdur, hydralazine, coreg and amlodipine.  BP improved.   Demand ischemia -recommend conservative management -no plan for invasive cardiac work-up -no need to further trend trops -maintain on telemetry -suspect trops elevated due to HTN emergency, DC heparin   HTN emergency -Continue Coreg 12.5 mg twice daily, amlodipine 10 mg daily, hydralazine 50 mg 3 times daily, Imdur 60 mg daily.  Lisinopril discontinued due to rising creatinine. -Off nicardipine gtt  If creatinine improves tomorrow, patient likely stable for discharge from cardiac perspective.   This patient's plan of care was discussed and created with Dr. Melton Alar and she is in agreement.    Signed: Gale Journey, PA-C 03/07/2024, 10:33 AM [facility] Cardiology

## 2024-03-08 DIAGNOSIS — I5033 Acute on chronic diastolic (congestive) heart failure: Secondary | ICD-10-CM | POA: Diagnosis not present

## 2024-03-08 LAB — CBC WITH DIFFERENTIAL/PLATELET
Abs Immature Granulocytes: 0.04 10*3/uL (ref 0.00–0.07)
Basophils Absolute: 0.1 10*3/uL (ref 0.0–0.1)
Basophils Relative: 1 %
Eosinophils Absolute: 0.3 10*3/uL (ref 0.0–0.5)
Eosinophils Relative: 5 %
HCT: 28.8 % — ABNORMAL LOW (ref 36.0–46.0)
Hemoglobin: 9.5 g/dL — ABNORMAL LOW (ref 12.0–15.0)
Immature Granulocytes: 1 %
Lymphocytes Relative: 21 %
Lymphs Abs: 1.4 10*3/uL (ref 0.7–4.0)
MCH: 31.4 pg (ref 26.0–34.0)
MCHC: 33 g/dL (ref 30.0–36.0)
MCV: 95 fL (ref 80.0–100.0)
Monocytes Absolute: 0.9 10*3/uL (ref 0.1–1.0)
Monocytes Relative: 12 %
Neutro Abs: 4.2 10*3/uL (ref 1.7–7.7)
Neutrophils Relative %: 60 %
Platelets: 184 10*3/uL (ref 150–400)
RBC: 3.03 MIL/uL — ABNORMAL LOW (ref 3.87–5.11)
RDW: 13.5 % (ref 11.5–15.5)
WBC: 6.9 10*3/uL (ref 4.0–10.5)
nRBC: 0 % (ref 0.0–0.2)

## 2024-03-08 LAB — BASIC METABOLIC PANEL
Anion gap: 11 (ref 5–15)
BUN: 64 mg/dL — ABNORMAL HIGH (ref 8–23)
CO2: 21 mmol/L — ABNORMAL LOW (ref 22–32)
Calcium: 8.7 mg/dL — ABNORMAL LOW (ref 8.9–10.3)
Chloride: 107 mmol/L (ref 98–111)
Creatinine, Ser: 2.23 mg/dL — ABNORMAL HIGH (ref 0.44–1.00)
GFR, Estimated: 20 mL/min — ABNORMAL LOW (ref >60)
Glucose, Bld: 102 mg/dL — ABNORMAL HIGH (ref 70–99)
Potassium: 4 mmol/L (ref 3.5–5.1)
Sodium: 139 mmol/L (ref 135–145)

## 2024-03-08 MED ORDER — CARVEDILOL 25 MG PO TABS: 12.5000 mg | ORAL_TABLET | Freq: Two times a day (BID) | ORAL | 0 refills | Status: AC

## 2024-03-08 MED ORDER — HYDRALAZINE HCL 50 MG PO TABS: 50.0000 mg | ORAL_TABLET | Freq: Three times a day (TID) | ORAL | 2 refills | Status: DC

## 2024-03-08 MED ORDER — FUROSEMIDE 20 MG PO TABS: 20.0000 mg | ORAL_TABLET | Freq: Every day | ORAL | 2 refills | Status: DC

## 2024-03-08 MED ORDER — ISOSORBIDE MONONITRATE ER 60 MG PO TB24: 60.0000 mg | ORAL_TABLET | Freq: Every day | ORAL | 2 refills | Status: AC

## 2024-03-08 NOTE — Plan of Care (Signed)
  Problem: Education: Goal: Knowledge of General Education information will improve Description: Including pain rating scale, medication(s)/side effects and non-pharmacologic comfort measures 03/08/2024 1133 by Serita Grit, RN Outcome: Completed/Met 03/08/2024 1021 by Serita Grit, RN Outcome: Progressing   Problem: Health Behavior/Discharge Planning: Goal: Ability to manage health-related needs will improve 03/08/2024 1133 by Serita Grit, RN Outcome: Completed/Met 03/08/2024 1021 by Serita Grit, RN Outcome: Progressing   Problem: Clinical Measurements: Goal: Ability to maintain clinical measurements within normal limits will improve 03/08/2024 1133 by Serita Grit, RN Outcome: Completed/Met 03/08/2024 1021 by Serita Grit, RN Outcome: Progressing Goal: Will remain free from infection 03/08/2024 1133 by Serita Grit, RN Outcome: Completed/Met 03/08/2024 1021 by Serita Grit, RN Outcome: Progressing Goal: Diagnostic test results will improve 03/08/2024 1133 by Serita Grit, RN Outcome: Completed/Met 03/08/2024 1021 by Serita Grit, RN Outcome: Progressing Goal: Respiratory complications will improve 03/08/2024 1133 by Serita Grit, RN Outcome: Completed/Met 03/08/2024 1021 by Serita Grit, RN Outcome: Progressing Goal: Cardiovascular complication will be avoided 03/08/2024 1133 by Serita Grit, RN Outcome: Completed/Met 03/08/2024 1021 by Serita Grit, RN Outcome: Progressing   Problem: Activity: Goal: Risk for activity intolerance will decrease 03/08/2024 1133 by Serita Grit, RN Outcome: Completed/Met 03/08/2024 1021 by Serita Grit, RN Outcome: Progressing   Problem: Nutrition: Goal: Adequate nutrition will be maintained 03/08/2024 1133 by Serita Grit, RN Outcome: Completed/Met 03/08/2024 1021 by Serita Grit, RN Outcome: Progressing   Problem: Coping: Goal: Level of anxiety will  decrease 03/08/2024 1133 by Serita Grit, RN Outcome: Completed/Met 03/08/2024 1021 by Serita Grit, RN Outcome: Progressing   Problem: Elimination: Goal: Will not experience complications related to bowel motility 03/08/2024 1133 by Serita Grit, RN Outcome: Completed/Met 03/08/2024 1021 by Serita Grit, RN Outcome: Progressing Goal: Will not experience complications related to urinary retention 03/08/2024 1133 by Serita Grit, RN Outcome: Completed/Met 03/08/2024 1021 by Serita Grit, RN Outcome: Progressing   Problem: Pain Managment: Goal: General experience of comfort will improve and/or be controlled 03/08/2024 1133 by Serita Grit, RN Outcome: Completed/Met 03/08/2024 1021 by Serita Grit, RN Outcome: Progressing   Problem: Safety: Goal: Ability to remain free from injury will improve 03/08/2024 1133 by Serita Grit, RN Outcome: Completed/Met 03/08/2024 1021 by Serita Grit, RN Outcome: Progressing   Problem: Skin Integrity: Goal: Risk for impaired skin integrity will decrease 03/08/2024 1133 by Serita Grit, RN Outcome: Completed/Met 03/08/2024 1021 by Serita Grit, RN Outcome: Progressing

## 2024-03-08 NOTE — Discharge Summary (Signed)
 Physician Discharge Summary   Patient: Daisy Mora MRN: [id number] DOB: 03-Jul-1926  Admit date:     03/04/2024  Discharge date: 03/08/24  Discharge Physician: Loyce Dys   PCP: Luciana Axe, NP   Recommendations at discharge:  Follow-up with cardiologist  Discharge Diagnoses: Acute on chronic heart failure with preserved ejection fraction (HFpEF) (HCC) Demand ischemia (HCC) Chronic kidney disease (CKD), stage III (moderate) (HCC) Essential hypertension Physical deconditioning  Hospital Course: [name] is a 89 y.o. female with medical history significant of chronic HFpEF, moderate MR, moderate pulmonary hypertension, refractory HTN, CKD stage IIIb, hypothyroidism, presented with worsening of shortness of breath and leg swelling.  She was also experiencing exertional dyspnea and orthopnea. Patient found to have CHF exacerbation requiring the input of cardiology.  Underwent diuresis with significant improvement in respiratory function back to baseline.  Did have a slight bump in renal function.  Patient has been advised to hold off lisinopril as well as Lasix and will follow-up with cardiology service with monitoring of renal function.  Consultants: Cardiology Procedures performed: Cardiology Disposition: Home Diet recommendation:  Cardiac diet   DISCHARGE MEDICATION: Allergies as of 03/08/2024       Reactions   Aspirin Tinitus   Losartan Potassium-hctz Other (See Comments)        Medication List     STOP taking these medications    FISH OIL PO   lisinopril 40 MG tablet Commonly known as: ZESTRIL       TAKE these medications    acetaminophen 325 MG tablet Commonly known as: TYLENOL Take 1-2 tablets (325-650 mg total) by mouth every 6 (six) hours as needed for mild pain (pain score 1-3 or temp > 100.5).   amLODipine 10 MG tablet Commonly known as: NORVASC Take 10 mg by mouth daily.   carvedilol 25 MG tablet Commonly known as: COREG Take  0.5 tablets (12.5 mg total) by mouth 2 (two) times daily with a meal. What changed: how much to take   Cholecalciferol 50 MCG (2000 UT) Caps Take 1 capsule by mouth daily.   cloNIDine 0.1 MG tablet Commonly known as: CATAPRES Take 0.1 mg by mouth 2 (two) times daily.   cyanocobalamin 500 MCG tablet Commonly known as: VITAMIN B12 Take 500 mcg by mouth daily.   hydrALAZINE 50 MG tablet Commonly known as: APRESOLINE Take 1 tablet (50 mg total) by mouth every 8 (eight) hours.   isosorbide mononitrate 60 MG 24 hr tablet Commonly known as: IMDUR Take 1 tablet (60 mg total) by mouth daily. Start taking on: March 09, 2024   levothyroxine 25 MCG tablet Commonly known as: SYNTHROID Take 25 mcg by mouth daily before breakfast.   multivitamin capsule Take 1 capsule by mouth daily.   omeprazole 20 MG tablet Commonly known as: PRILOSEC OTC Take 20 mg by mouth daily.   polyethylene glycol 17 g packet Commonly known as: MIRALAX / GLYCOLAX Take 17 g by mouth daily as needed for mild constipation.   Restasis 0.05 % ophthalmic emulsion Generic drug: cycloSPORINE Place 1 drop into both eyes 2 (two) times daily.   solifenacin 5 MG tablet Commonly known as: VESICARE Take 5 mg by mouth daily.   Turmeric 400 MG Caps Take by mouth.        Follow-up Information     Custovic, Rozell Searing, DO. Go in 1 week(s).   Specialty: Cardiology Contact information: [address] [place] [address] [phone]  Discharge Exam: Filed Weights   03/05/24 2152 03/07/24 0500 03/08/24 0500  Weight: 60.7 kg 59.5 kg 59 kg   General.  Frail elderly lady, in no acute distress. Pulmonary.  Lungs clear bilaterally, normal respiratory effort. CV.  Regular rate and rhythm, positive murmur Abdomen.  Soft, nontender, nondistended, BS positive. CNS.  Alert and oriented .  No focal neurologic deficit. Extremities.  Trace LE edema, no cyanosis, pulses intact and  symmetrical.  Condition at discharge: good  The results of significant diagnostics from this hospitalization (including imaging, microbiology, ancillary and laboratory) are listed below for reference.   Imaging Studies: ECHOCARDIOGRAM COMPLETE Result Date: 03/06/2024    ECHOCARDIOGRAM REPORT   Patient Name:   Daisy Mora Date of Exam: 03/05/2024 Medical Rec #:  [id number]        Height:       63.0 in Accession #:    [id number]       Weight:       140.0 lb Date of Birth:  July 30, 1926         BSA:          1.662 m Patient Age:    89 years         BP:           168/65 mmHg Patient Gender: F                HR:           93 bpm. Exam Location:  [facility] Procedure: 2D Echo, Cardiac Doppler and Color Doppler (Both Spectral and Color            Flow Doppler were utilized during procedure). Indications:     CHF- Acute Diastolic I50.31  History:         Patient has no prior history of Echocardiogram examinations.                  CHF, CKD, stage 3; Risk Factors:Hypertension.  Sonographer:     Lucendia Herrlich RCS Referring Phys:  [id number] CARALYN HUDSON Diagnosing Phys: Rozell Searing Custovic IMPRESSIONS  1. Left ventricular ejection fraction, by estimation, is 60 to 65%. The left ventricle has normal function. The left ventricle has no regional wall motion abnormalities. Left ventricular diastolic parameters are consistent with Grade II diastolic dysfunction (pseudonormalization).  2. Right ventricular systolic function is normal. The right ventricular size is normal.  3. Left atrial size was severely dilated.  4. The mitral valve is normal in structure. Mild mitral valve regurgitation. No evidence of mitral stenosis.  5. The aortic valve is normal in structure. Aortic valve regurgitation is not visualized. No aortic stenosis is present.  6. The inferior vena cava is normal in size with greater than 50% respiratory variability, suggesting right atrial pressure of 3 mmHg. FINDINGS  Left Ventricle: Left ventricular ejection  fraction, by estimation, is 60 to 65%. The left ventricle has normal function. The left ventricle has no regional wall motion abnormalities. The left ventricular internal cavity size was normal in size. There is  no left ventricular hypertrophy. Left ventricular diastolic parameters are consistent with Grade II diastolic dysfunction (pseudonormalization). Right Ventricle: The right ventricular size is normal. No increase in right ventricular wall thickness. Right ventricular systolic function is normal. Left Atrium: Left atrial size was severely dilated. Right Atrium: Right atrial size was normal in size. Pericardium: There is no evidence of pericardial effusion. Mitral Valve: The mitral valve is normal in structure. Mild mitral valve regurgitation. No evidence  of mitral valve stenosis. MV peak gradient, 9.5 mmHg. The mean mitral valve gradient is 4.0 mmHg. Tricuspid Valve: The tricuspid valve is normal in structure. Tricuspid valve regurgitation is trivial. Aortic Valve: The aortic valve is normal in structure. Aortic valve regurgitation is not visualized. No aortic stenosis is present. Aortic valve peak gradient measures 11.2 mmHg. Pulmonic Valve: The pulmonic valve was normal in structure. Pulmonic valve regurgitation is not visualized. No evidence of pulmonic stenosis. Aorta: The aortic root is normal in size and structure. Venous: The inferior vena cava is normal in size with greater than 50% respiratory variability, suggesting right atrial pressure of 3 mmHg. IAS/Shunts: No atrial level shunt detected by color flow Doppler.  LEFT VENTRICLE PLAX 2D LVIDd:         3.12 cm   Diastology LVIDs:         1.94 cm   LV e' medial:    3.58 cm/s LV PW:         1.20 cm   LV E/e' medial:  22.5 LV IVS:        1.03 cm   LV e' lateral:   3.29 cm/s LVOT diam:     2.00 cm   LV E/e' lateral: 24.5 LV SV:         79 LV SV Index:   47 LVOT Area:     3.14 cm  RIGHT VENTRICLE             IVC RV S prime:     19.50 cm/s  IVC diam: 2.17  cm TAPSE (M-mode): 2.9 cm LEFT ATRIUM             Index        RIGHT ATRIUM           Index LA diam:        5.40 cm 3.25 cm/m   RA Area:     15.50 cm LA Vol (A2C):   76.1 ml 45.77 ml/m  RA Volume:   39.20 ml  23.59 ml/m LA Vol (A4C):   91.9 ml 55.28 ml/m LA Biplane Vol: 91.0 ml 54.76 ml/m  AORTIC VALVE AV Area (Vmax): 2.52 cm AV Vmax:        167.00 cm/s AV Peak Grad:   11.2 mmHg LVOT Vmax:      134.00 cm/s LVOT Vmean:     90.400 cm/s LVOT VTI:       0.250 m  AORTA Ao Root diam: 3.10 cm Ao Asc diam:  3.70 cm MITRAL VALVE                TRICUSPID VALVE MV Area (PHT): 4.10 cm     TR Peak grad:   8.3 mmHg MV Area VTI:   2.12 cm     TR Vmax:        144.00 cm/s MV Peak grad:  9.5 mmHg MV Mean grad:  4.0 mmHg     SHUNTS MV Vmax:       1.54 m/s     Systemic VTI:  0.25 m MV Vmean:      88.4 cm/s    Systemic Diam: 2.00 cm MV Decel Time: 185 msec MV E velocity: 80.50 cm/s MV A velocity: 170.00 cm/s MV E/A ratio:  0.47 Sabina Custovic Electronically signed by Clotilde Dieter Signature Date/Time: 03/06/2024/1:37:23 PM    Final    DG Chest 1 View Result Date: 03/05/2024 CLINICAL DATA:  Congestive heart failure. EXAM: CHEST  1 VIEW COMPARISON:  03/04/2024  FINDINGS: Stable cardiac enlargement. Stable small bilateral pleural effusions. Degree of pulmonary interstitial edema may be mildly improved. No focal airspace consolidation or pneumothorax. IMPRESSION: Mild improvement in pulmonary interstitial edema. Stable small bilateral pleural effusions. Stable cardiac enlargement. Electronically Signed   By: Irish Lack M.D.   On: 03/05/2024 11:10   DG Chest Port 1 View Result Date: 03/04/2024 CLINICAL DATA:  [id number] with shortness of breath onset yesterday. EXAM: PORTABLE CHEST 1 VIEW COMPARISON:  Portable chest 12/22/2021 FINDINGS: The patient is rotated to the right exaggerating the mediastinum. The aorta is tortuous and calcified. The heart is moderately enlarged. The mitral ring is heavily calcified. There is  perihilar vascular congestion, mild generalized interstitial consolidation consistent with edema and small pleural effusions. Findings consistent with CHF or fluid overload. Scattered bilateral perihilar opacities are also noted, could be due to ground-glass edema or pneumonia. No other focal infiltrate is seen. There is slight thickening in the right horizontal fissure most likely due to fluid in the fissure. Additional right perihilar linear scar-like opacity. There is levoscoliosis and degenerative change of the spine with osteopenia. No new osseous findings. IMPRESSION: 1. Cardiomegaly with perihilar vascular congestion, mild generalized interstitial consolidation consistent with edema and small pleural effusions. Findings consistent with CHF or fluid overload. 2. Scattered bilateral perihilar opacities could be due to ground-glass edema or pneumonia. 3. Aortic atherosclerosis. 4. Osteopenia and degenerative change. Electronically Signed   By: Almira Bar M.D.   On: 03/04/2024 04:26    Microbiology: Results for orders placed or performed during the hospital encounter of 12/22/21  Resp Panel by RT-PCR (Flu A&B, Covid) Nasopharyngeal Swab     Status: None   Collection Time: 12/22/21  7:45 PM   Specimen: Nasopharyngeal Swab; Nasopharyngeal(NP) swabs in vial transport medium  Result Value Ref Range Status   SARS Coronavirus 2 by RT PCR NEGATIVE NEGATIVE Final    Comment: (NOTE) SARS-CoV-2 target nucleic acids are NOT DETECTED.  The SARS-CoV-2 RNA is generally detectable in upper respiratory specimens during the acute phase of infection. The lowest concentration of SARS-CoV-2 viral copies this assay can detect is 138 copies/mL. A negative result does not preclude SARS-Cov-2 infection and should not be used as the sole basis for treatment or other patient management decisions. A negative result may occur with  improper specimen collection/handling, submission of specimen other than  nasopharyngeal swab, presence of viral mutation(s) within the areas targeted by this assay, and inadequate number of viral copies(<138 copies/mL). A negative result must be combined with clinical observations, patient history, and epidemiological information. The expected result is Negative.  Fact Sheet for Patients:  BloggerCourse.com  Fact Sheet for Healthcare Providers:  SeriousBroker.it  This test is no t yet approved or cleared by the [place] FDA and  has been authorized for detection and/or diagnosis of SARS-CoV-2 by FDA under an Emergency Use Authorization (EUA). This EUA will remain  in effect (meaning this test can be used) for the duration of the COVID-19 declaration under Section 564(b)(1) of the Act, 21 U.S.C.section 360bbb-3(b)(1), unless the authorization is terminated  or revoked sooner.       Influenza A by PCR NEGATIVE NEGATIVE Final   Influenza B by PCR NEGATIVE NEGATIVE Final    Comment: (NOTE) The Xpert Xpress SARS-CoV-2/FLU/RSV plus assay is intended as an aid in the diagnosis of influenza from Nasopharyngeal swab specimens and should not be used as a sole basis for treatment. Nasal washings and aspirates are unacceptable for Xpert Xpress SARS-CoV-2/FLU/RSV testing.  Fact Sheet for Patients: BloggerCourse.com  Fact Sheet for Healthcare Providers: SeriousBroker.it  This test is not yet approved or cleared by the [place] FDA and has been authorized for detection and/or diagnosis of SARS-CoV-2 by FDA under an Emergency Use Authorization (EUA). This EUA will remain in effect (meaning this test can be used) for the duration of the COVID-19 declaration under Section 564(b)(1) of the Act, 21 U.S.C. section 360bbb-3(b)(1), unless the authorization is terminated or revoked.  Performed at [facility], [address] [place], [place]  [address]   Resp Panel by RT-PCR (Flu A&B, Covid) Nasopharyngeal Swab     Status: None   Collection Time: 12/28/21  2:25 PM   Specimen: Nasopharyngeal Swab; Nasopharyngeal(NP) swabs in vial transport medium  Result Value Ref Range Status   SARS Coronavirus 2 by RT PCR NEGATIVE NEGATIVE Final    Comment: (NOTE) SARS-CoV-2 target nucleic acids are NOT DETECTED.  The SARS-CoV-2 RNA is generally detectable in upper respiratory specimens during the acute phase of infection. The lowest concentration of SARS-CoV-2 viral copies this assay can detect is 138 copies/mL. A negative result does not preclude SARS-Cov-2 infection and should not be used as the sole basis for treatment or other patient management decisions. A negative result may occur with  improper specimen collection/handling, submission of specimen other than nasopharyngeal swab, presence of viral mutation(s) within the areas targeted by this assay, and inadequate number of viral copies(<138 copies/mL). A negative result must be combined with clinical observations, patient history, and epidemiological information. The expected result is Negative.  Fact Sheet for Patients:  BloggerCourse.com  Fact Sheet for Healthcare Providers:  SeriousBroker.it  This test is no t yet approved or cleared by the [place] FDA and  has been authorized for detection and/or diagnosis of SARS-CoV-2 by FDA under an Emergency Use Authorization (EUA). This EUA will remain  in effect (meaning this test can be used) for the duration of the COVID-19 declaration under Section 564(b)(1) of the Act, 21 U.S.C.section 360bbb-3(b)(1), unless the authorization is terminated  or revoked sooner.       Influenza A by PCR NEGATIVE NEGATIVE Final   Influenza B by PCR NEGATIVE NEGATIVE Final    Comment: (NOTE) The Xpert Xpress SARS-CoV-2/FLU/RSV plus assay is intended as an aid in the diagnosis of influenza from  Nasopharyngeal swab specimens and should not be used as a sole basis for treatment. Nasal washings and aspirates are unacceptable for Xpert Xpress SARS-CoV-2/FLU/RSV testing.  Fact Sheet for Patients: BloggerCourse.com  Fact Sheet for Healthcare Providers: SeriousBroker.it  This test is not yet approved or cleared by the [place] FDA and has been authorized for detection and/or diagnosis of SARS-CoV-2 by FDA under an Emergency Use Authorization (EUA). This EUA will remain in effect (meaning this test can be used) for the duration of the COVID-19 declaration under Section 564(b)(1) of the Act, 21 U.S.C. section 360bbb-3(b)(1), unless the authorization is terminated or revoked.  Performed at [facility], [address] [place], [place] [address]     Labs: CBC: Recent Labs  Lab 03/04/24 0409 03/05/24 0304 03/06/24 0617 03/07/24 0555 03/08/24 0520  WBC 7.7 7.1 7.0 6.8 6.9  NEUTROABS  --   --   --  4.2 4.2  HGB 11.8* 10.2* 9.9* 9.8* 9.5*  HCT 35.5* 31.0* 30.3* 29.3* 28.8*  MCV 92.2 92.3 92.9 92.1 95.0  PLT 228 224 210 192 184   Basic Metabolic Panel: Recent Labs  Lab 03/05/24 0304 03/06/24 0617  03/06/24 1902 03/07/24 0555 03/08/24 0520  NA 138 139 137 138 139  K 4.1 3.9 3.8 3.8 4.0  CL 109 108 107 109 107  CO2 19* 21* 20* 21* 21*  GLUCOSE 102* 95 140* 101* 102*  BUN 40* 39* 45* 49* 64*  CREATININE 1.62* 1.60* 2.02* 2.04* 2.23*  CALCIUM 8.8* 8.9 8.5* 8.7* 8.7*   Liver Function Tests: No results for input(s): "AST", "ALT", "ALKPHOS", "BILITOT", "PROT", "ALBUMIN" in the last 168 hours. CBG: No results for input(s): "GLUCAP" in the last 168 hours.  Discharge time spent:  32 minutes.  Signed: Loyce Dys, MD [facility] 03/08/2024

## 2024-03-08 NOTE — Plan of Care (Signed)

## 2024-03-08 NOTE — Progress Notes (Signed)
 Physical Therapy Treatment Patient Details Name: KODA DEFRANK MRN: [id number] DOB: [birth year] Today's Date: 03/08/2024   History of Present Illness Pt is a 89 y.o. female with medical history significant of chronic HFpEF, moderate MR, moderate pulmonary hypertension, refractory HTN, CKD stage IIIb, hypothyroidism, presented with worsening of shortness of breath and leg swelling. MD assessment includes: acute CHF, NSTEMI, HTN emergency, and deconditioning.    PT Comments  Pt was pleasant and motivated to participate during the session and put forth good effort throughout. Pt steady with both transfers and gait with a RW with no overt LOB and no cues needed for proper sequencing. Pt presented with no significant R lateral lean in sitting or standing this session.  During below unsupported standing balance training pt presented with occasional min instability requiring min A to correct most notably during tasks in semi-standing position.  Pt reported no adverse symptoms during the session with SpO2 and HR WNL throughout on room air.      If plan is discharge home, recommend the following: A little help with walking and/or transfers;A little help with bathing/dressing/bathroom;Assistance with cooking/housework;Direct supervision/assist for medications management;Assist for transportation   Can travel by private vehicle        Equipment Recommendations  Other (comment) (recommended RW with family stating they were going to attempt to obtain)    Recommendations for Other Services       Precautions / Restrictions Precautions Precautions: Fall Restrictions Weight Bearing Restrictions Per Provider Order: No     Mobility  Bed Mobility               General bed mobility comments: NT, in recliner    Transfers Overall transfer level: Needs assistance Equipment used: Rolling walker (2 wheels) Transfers: Sit to/from Stand Sit to Stand: Supervision           General transfer  comment: Good eccentric and concentric control and stability with no cues needed for hand placement    Ambulation/Gait Ambulation/Gait assistance: Supervision Gait Distance (Feet): 100 Feet Assistive device: Rolling walker (2 wheels) Gait Pattern/deviations: Narrow base of support, Step-through pattern, Decreased step length - right, Decreased step length - left Gait velocity: decreased     General Gait Details: Slow cadence with no significatn R lateral lean, generally steady with no overt LOB   Stairs             Wheelchair Mobility     Tilt Bed    Modified Rankin (Stroke Patients Only)       Balance Overall balance assessment: Needs assistance         Standing balance support: No upper extremity supported Standing balance-Leahy Scale: Fair                              Hotel manager: No apparent difficulties  Cognition Arousal: Alert Behavior During Therapy: WFL for tasks assessed/performed   PT - Cognitive impairments: No apparent impairments                         Following commands: Intact      Cueing Cueing Techniques: Verbal cues, Tactile cues, Visual cues  Exercises Other Exercises Other Exercises: Static standing balance training with feet apart, together, and semi-tandem with combinations of EO/EC and head still/head turns Other Exercises: Dynamic standing balance training with feet apart and together with reaching outside BOS    General Comments  Pertinent Vitals/Pain Pain Assessment Pain Assessment: No/denies pain    Home Living                          Prior Function            PT Goals (current goals can now be found in the care plan section) Progress towards PT goals: Progressing toward goals    Frequency    Min 2X/week      PT Plan      Co-evaluation              AM-PAC PT "6 Clicks" Mobility   Outcome Measure  Help needed turning from  your back to your side while in a flat bed without using bedrails?: A Little Help needed moving from lying on your back to sitting on the side of a flat bed without using bedrails?: A Little Help needed moving to and from a bed to a chair (including a wheelchair)?: A Little Help needed standing up from a chair using your arms (e.g., wheelchair or bedside chair)?: A Little Help needed to walk in hospital room?: A Little Help needed climbing 3-5 steps with a railing? : A Little 6 Click Score: 18    End of Session Equipment Utilized During Treatment: Gait belt Activity Tolerance: Patient tolerated treatment well Patient left: in chair;with call bell/phone within reach;with chair alarm set;with family/visitor present Nurse Communication: Mobility status PT Visit Diagnosis: Unsteadiness on feet (R26.81);Difficulty in walking, not elsewhere classified (R26.2);Muscle weakness (generalized) (M62.81)     Time: 1610-9604 PT Time Calculation (min) (ACUTE ONLY): 21 min  Charges:    $Therapeutic Exercise: 8-22 mins PT General Charges $$ ACUTE PT VISIT: 1 Visit                    D. Scott Charley Miske PT, DPT 03/08/24, 1:49 PM

## 2024-03-08 NOTE — Progress Notes (Signed)
 [name] CLINIC CARDIOLOGY PROGRESS NOTE       Patient ID: Daisy Mora MRN: [id number] DOB/AGE: Dec 02, 1926 89 y.o.  Admit date: 03/04/2024 Referring Physician Dr Chipper Herb Primary Physician Luciana Axe, NP Primary Cardiologist Gwen Pounds (retired) Reason for Consultation elevated trops  HPI: Daisy Mora is a 89 y.o. female with medical history significant of chronic HFpEF, moderate MR, moderate pulmonary hypertension, refractory HTN, CKD stage IIIb, hypothyroidism, presented with worsening of shortness of breath and leg swelling.   Interval history: -Patient seen and examined this AM, resting comfortably in bed.  -Denies any chest pain or shortness of breath symptoms. -BP slightly elevated this morning but overall much improved from when she first came in. -No appreciable lower extremity edema.  Review of systems complete and found to be negative unless listed above     Past Medical History:  Diagnosis Date   Hypertension     Past Surgical History:  Procedure Laterality Date   ABDOMINAL HYSTERECTOMY     APPENDECTOMY     BREAST SURGERY     INTRAMEDULLARY (IM) NAIL INTERTROCHANTERIC Left 12/23/2021   Procedure: INTRAMEDULLARY (IM) NAIL INTERTROCHANTRIC;  Surgeon: Juanell Fairly, MD;  Location: [facility] ORS;  Service: Orthopedics;  Laterality: Left;    Medications Prior to Admission  Medication Sig Dispense Refill Last Dose/Taking   acetaminophen (TYLENOL) 325 MG tablet Take 1-2 tablets (325-650 mg total) by mouth every 6 (six) hours as needed for mild pain (pain score 1-3 or temp > 100.5). 30 tablet 0 Taking As Needed   amLODipine (NORVASC) 10 MG tablet Take 10 mg by mouth daily.   03/03/2024   carvedilol (COREG) 25 MG tablet Take 25 mg by mouth 2 (two) times daily with a meal.   03/03/2024   Cholecalciferol 50 MCG (2000 UT) CAPS Take 1 capsule by mouth daily.   Taking   cloNIDine (CATAPRES) 0.1 MG tablet Take 0.1 mg by mouth 2 (two) times daily.   03/03/2024    cyanocobalamin 500 MCG tablet Take 500 mcg by mouth daily.   03/03/2024   levothyroxine (SYNTHROID) 25 MCG tablet Take 25 mcg by mouth daily before breakfast.   03/03/2024   lisinopril (ZESTRIL) 40 MG tablet Take 40 mg by mouth daily.   03/03/2024   Multiple Vitamin (MULTIVITAMIN) capsule Take 1 capsule by mouth daily.   03/03/2024   omeprazole (PRILOSEC OTC) 20 MG tablet Take 20 mg by mouth daily.   03/03/2024   polyethylene glycol (MIRALAX / GLYCOLAX) 17 g packet Take 17 g by mouth daily as needed for mild constipation. 14 each 0 Past Week   RESTASIS 0.05 % ophthalmic emulsion Place 1 drop into both eyes 2 (two) times daily.   Taking   solifenacin (VESICARE) 5 MG tablet Take 5 mg by mouth daily.   03/03/2024   Turmeric 400 MG CAPS Take by mouth.   03/03/2024   Omega-3 Fatty Acids (FISH OIL PO) Take by mouth daily. (Patient not taking: Reported on 03/04/2024)   Not Taking   Social History   Socioeconomic History   Marital status: Widowed    Spouse name: Not on file   Number of children: Not on file   Years of education: Not on file   Highest education level: Not on file  Occupational History   Not on file  Tobacco Use   Smoking status: Never   Smokeless tobacco: Never  Vaping Use   Vaping status: Never Used  Substance and Sexual Activity   Alcohol use: No  Drug use: No   Sexual activity: Not Currently  Other Topics Concern   Not on file  Social History Narrative   Not on file   Social Drivers of Health   Financial Resource Strain: Low Risk  (06/12/2019)   Received from [facility]   Overall Financial Resource Strain (CARDIA)    Difficulty of Paying Living Expenses: Not hard at all  Food Insecurity: No Food Insecurity (03/05/2024)   Hunger Vital Sign    Worried About Running Out of Food in the Last Year: Never true    Ran Out of Food in the Last Year: Never true  Transportation Needs: No Transportation Needs (03/05/2024)   PRAPARE - Scientist, research (physical sciences) (Medical): No    Lack of Transportation (Non-Medical): No  Physical Activity: Sufficiently Active (06/12/2019)   Received from [facility]   Exercise Vital Sign    Days of Exercise per Week: 6 days    Minutes of Exercise per Session: 30 min  Stress: Not on file  Social Connections: Socially Isolated (03/05/2024)   Social Connection and Isolation Panel [NHANES]    Frequency of Communication with Friends and Family: Never    Frequency of Social Gatherings with Friends and Family: Never    Attends Religious Services: Never    Database administrator or Organizations: No    Attends Banker Meetings: Never    Marital Status: Widowed  Intimate Partner Violence: Not At Risk (03/05/2024)   Humiliation, Afraid, Rape, and Kick questionnaire    Fear of Current or Ex-Partner: No    Emotionally Abused: No    Physically Abused: No    Sexually Abused: No    History reviewed. No pertinent family history.   Vitals:   03/07/24 2329 03/08/24 0355 03/08/24 0500 03/08/24 0833  BP: (!) 143/47 (!) 161/58  (!) 158/60  Pulse: 72 75  79  Resp: 18 18    Temp: 98.4 F (36.9 C) 98.6 F (37 C)  98.4 F (36.9 C)  TempSrc:    Oral  SpO2: 93% 93%  93%  Weight:   59 kg   Height:        PHYSICAL EXAM General: awake, well nourished, in no acute distress. HEENT: Normocephalic and atraumatic. Neck: No JVD.  Lungs: Normal respiratory effort.  CTAB. Heart: HRRR. Normal S1 and S2 without gallops or murmurs.  Abdomen: Non-distended appearing.  Msk: Normal strength and tone for age. Extremities: Warm and well perfused. No clubbing, cyanosis.  No edema.  Neuro: Alert and oriented X 3. Psych: Answers questions appropriately.   Labs: Basic Metabolic Panel: Recent Labs    03/07/24 0555 03/08/24 0520  NA 138 139  K 3.8 4.0  CL 109 107  CO2 21* 21*  GLUCOSE 101* 102*  BUN 49* 64*  CREATININE 2.04* 2.23*  CALCIUM 8.7* 8.7*   Liver Function Tests: No results  for input(s): "AST", "ALT", "ALKPHOS", "BILITOT", "PROT", "ALBUMIN" in the last 72 hours. No results for input(s): "LIPASE", "AMYLASE" in the last 72 hours. CBC: Recent Labs    03/07/24 0555 03/08/24 0520  WBC 6.8 6.9  NEUTROABS 4.2 4.2  HGB 9.8* 9.5*  HCT 29.3* 28.8*  MCV 92.1 95.0  PLT 192 184   Cardiac Enzymes: No results for input(s): "CKTOTAL", "CKMB", "CKMBINDEX", "TROPONINIHS" in the last 72 hours.  BNP: No results for input(s): "BNP" in the last 72 hours.  D-Dimer: No results for input(s): "DDIMER" in the last  72 hours. Hemoglobin A1C: No results for input(s): "HGBA1C" in the last 72 hours. Fasting Lipid Panel: No results for input(s): "CHOL", "HDL", "LDLCALC", "TRIG", "CHOLHDL", "LDLDIRECT" in the last 72 hours. Thyroid Function Tests: No results for input(s): "TSH", "T4TOTAL", "T3FREE", "THYROIDAB" in the last 72 hours.  Invalid input(s): "FREET3"  Anemia Panel: No results for input(s): "VITAMINB12", "FOLATE", "FERRITIN", "TIBC", "IRON", "RETICCTPCT" in the last 72 hours.   Radiology: ECHOCARDIOGRAM COMPLETE Result Date: 03/06/2024    ECHOCARDIOGRAM REPORT   Patient Name:   CHEVELLE COULSON Date of Exam: 03/05/2024 Medical Rec #:  [id number]        Height:       63.0 in Accession #:    [id number]       Weight:       140.0 lb Date of Birth:  09-07-1926         BSA:          1.662 m Patient Age:    89 years         BP:           168/65 mmHg Patient Gender: F                HR:           93 bpm. Exam Location:  [facility] Procedure: 2D Echo, Cardiac Doppler and Color Doppler (Both Spectral and Color            Flow Doppler were utilized during procedure). Indications:     CHF- Acute Diastolic I50.31  History:         Patient has no prior history of Echocardiogram examinations.                  CHF, CKD, stage 3; Risk Factors:Hypertension.  Sonographer:     Lucendia Herrlich RCS Referring Phys:  [id number] Maui Britten Diagnosing Phys: Rozell Searing Custovic IMPRESSIONS  1. Left ventricular  ejection fraction, by estimation, is 60 to 65%. The left ventricle has normal function. The left ventricle has no regional wall motion abnormalities. Left ventricular diastolic parameters are consistent with Grade II diastolic dysfunction (pseudonormalization).  2. Right ventricular systolic function is normal. The right ventricular size is normal.  3. Left atrial size was severely dilated.  4. The mitral valve is normal in structure. Mild mitral valve regurgitation. No evidence of mitral stenosis.  5. The aortic valve is normal in structure. Aortic valve regurgitation is not visualized. No aortic stenosis is present.  6. The inferior vena cava is normal in size with greater than 50% respiratory variability, suggesting right atrial pressure of 3 mmHg. FINDINGS  Left Ventricle: Left ventricular ejection fraction, by estimation, is 60 to 65%. The left ventricle has normal function. The left ventricle has no regional wall motion abnormalities. The left ventricular internal cavity size was normal in size. There is  no left ventricular hypertrophy. Left ventricular diastolic parameters are consistent with Grade II diastolic dysfunction (pseudonormalization). Right Ventricle: The right ventricular size is normal. No increase in right ventricular wall thickness. Right ventricular systolic function is normal. Left Atrium: Left atrial size was severely dilated. Right Atrium: Right atrial size was normal in size. Pericardium: There is no evidence of pericardial effusion. Mitral Valve: The mitral valve is normal in structure. Mild mitral valve regurgitation. No evidence of mitral valve stenosis. MV peak gradient, 9.5 mmHg. The mean mitral valve gradient is 4.0 mmHg. Tricuspid Valve: The tricuspid valve is normal in structure. Tricuspid valve regurgitation is trivial. Aortic Valve:  The aortic valve is normal in structure. Aortic valve regurgitation is not visualized. No aortic stenosis is present. Aortic valve peak gradient  measures 11.2 mmHg. Pulmonic Valve: The pulmonic valve was normal in structure. Pulmonic valve regurgitation is not visualized. No evidence of pulmonic stenosis. Aorta: The aortic root is normal in size and structure. Venous: The inferior vena cava is normal in size with greater than 50% respiratory variability, suggesting right atrial pressure of 3 mmHg. IAS/Shunts: No atrial level shunt detected by color flow Doppler.  LEFT VENTRICLE PLAX 2D LVIDd:         3.12 cm   Diastology LVIDs:         1.94 cm   LV e' medial:    3.58 cm/s LV PW:         1.20 cm   LV E/e' medial:  22.5 LV IVS:        1.03 cm   LV e' lateral:   3.29 cm/s LVOT diam:     2.00 cm   LV E/e' lateral: 24.5 LV SV:         79 LV SV Index:   47 LVOT Area:     3.14 cm  RIGHT VENTRICLE             IVC RV S prime:     19.50 cm/s  IVC diam: 2.17 cm TAPSE (M-mode): 2.9 cm LEFT ATRIUM             Index        RIGHT ATRIUM           Index LA diam:        5.40 cm 3.25 cm/m   RA Area:     15.50 cm LA Vol (A2C):   76.1 ml 45.77 ml/m  RA Volume:   39.20 ml  23.59 ml/m LA Vol (A4C):   91.9 ml 55.28 ml/m LA Biplane Vol: 91.0 ml 54.76 ml/m  AORTIC VALVE AV Area (Vmax): 2.52 cm AV Vmax:        167.00 cm/s AV Peak Grad:   11.2 mmHg LVOT Vmax:      134.00 cm/s LVOT Vmean:     90.400 cm/s LVOT VTI:       0.250 m  AORTA Ao Root diam: 3.10 cm Ao Asc diam:  3.70 cm MITRAL VALVE                TRICUSPID VALVE MV Area (PHT): 4.10 cm     TR Peak grad:   8.3 mmHg MV Area VTI:   2.12 cm     TR Vmax:        144.00 cm/s MV Peak grad:  9.5 mmHg MV Mean grad:  4.0 mmHg     SHUNTS MV Vmax:       1.54 m/s     Systemic VTI:  0.25 m MV Vmean:      88.4 cm/s    Systemic Diam: 2.00 cm MV Decel Time: 185 msec MV E velocity: 80.50 cm/s MV A velocity: 170.00 cm/s MV E/A ratio:  0.47 Sabina Custovic Electronically signed by Clotilde Dieter Signature Date/Time: 03/06/2024/1:37:23 PM    Final    DG Chest 1 View Result Date: 03/05/2024 CLINICAL DATA:  Congestive heart failure. EXAM:  CHEST  1 VIEW COMPARISON:  03/04/2024 FINDINGS: Stable cardiac enlargement. Stable small bilateral pleural effusions. Degree of pulmonary interstitial edema may be mildly improved. No focal airspace consolidation or pneumothorax. IMPRESSION: Mild improvement in pulmonary interstitial edema. Stable  small bilateral pleural effusions. Stable cardiac enlargement. Electronically Signed   By: Irish Lack M.D.   On: 03/05/2024 11:10   DG Chest Port 1 View Result Date: 03/04/2024 CLINICAL DATA:  [id number] with shortness of breath onset yesterday. EXAM: PORTABLE CHEST 1 VIEW COMPARISON:  Portable chest 12/22/2021 FINDINGS: The patient is rotated to the right exaggerating the mediastinum. The aorta is tortuous and calcified. The heart is moderately enlarged. The mitral ring is heavily calcified. There is perihilar vascular congestion, mild generalized interstitial consolidation consistent with edema and small pleural effusions. Findings consistent with CHF or fluid overload. Scattered bilateral perihilar opacities are also noted, could be due to ground-glass edema or pneumonia. No other focal infiltrate is seen. There is slight thickening in the right horizontal fissure most likely due to fluid in the fissure. Additional right perihilar linear scar-like opacity. There is levoscoliosis and degenerative change of the spine with osteopenia. No new osseous findings. IMPRESSION: 1. Cardiomegaly with perihilar vascular congestion, mild generalized interstitial consolidation consistent with edema and small pleural effusions. Findings consistent with CHF or fluid overload. 2. Scattered bilateral perihilar opacities could be due to ground-glass edema or pneumonia. 3. Aortic atherosclerosis. 4. Osteopenia and degenerative change. Electronically Signed   By: Almira Bar M.D.   On: 03/04/2024 04:26    ECHO as above   TELEMETRY reviewed by me [facility]) 03/08/2024 : NSR rate 70s  EKG reviewed by me: NSR with PVCs  Data reviewed  by me [facility]) 03/08/2024: last 24h vitals tele labs imaging I/O provider notes  Principal Problem:   Acute on chronic heart failure with preserved ejection fraction (HFpEF) (HCC) Active Problems:   Essential hypertension   CHF (congestive heart failure) (HCC)   Demand ischemia (HCC)   Chronic kidney disease (CKD), stage III (moderate) (HCC)   Physical deconditioning    ASSESSMENT AND PLAN:   Acute on chronic HFpEF decompensation Chronic MR Chronic moderate pulmonary hypertension -Continue to hold Lasix due to renal function. -Monitor kidney function -Continue imdur, hydralazine, coreg and amlodipine.  BP improved.   Demand ischemia -recommend conservative management -no plan for invasive cardiac work-up -no need to further trend trops -maintain on telemetry -suspect trops elevated due to HTN emergency, DC heparin   HTN emergency -Continue Coreg 12.5 mg twice daily, amlodipine 10 mg daily, hydralazine 50 mg 3 times daily, Imdur 60 mg daily.  Lisinopril discontinued due to rising creatinine. -Off nicardipine gtt  Patient overall stable from cardiac perspective for discharge.  Creatinine still remains elevated, primary team recommending discharge with close follow-up for repeat labs.  Will plan for follow-up in our clinic in 1 to 2 weeks.   This patient's plan of care was discussed and created with Dr. Melton Alar and she is in agreement.    Signed: Gale Journey, PA-C 03/08/2024, 9:15 AM [facility] Cardiology

## 2024-03-08 NOTE — Progress Notes (Signed)
 AVS given and reviewed with patient and daughter. PIV removed. All questions answered

## 2024-03-08 NOTE — TOC Transition Note (Signed)
 Transition of Care [facility]) - Discharge Note   Patient Details  Name: Daisy Mora MRN: [id number] Date of Birth: 25-Mar-1926  [facility] of [facility]) CM/SW Contact:  Truddie Hidden, RN Phone Number: 03/08/2024, 11:54 AM   Clinical Narrative:    Spoke with patient regarding PT recommendation for [facility] PT/OT. She is agreeable and request to speak with her daughter, Rinaldo Cloud.  Spoke with Rinaldo Cloud. She was provided choices for [facility], and [facility]. She does not have a preference of an agency. Patient advised the accepting agency will contact her directly to scheduled SOC within 48 post discharge. Patient daughter request she be contacted  for scheduling.   Referral sent and accepted by  [place] from [place].  TOC signing off.             Patient Goals and CMS Choice            Discharge Placement                       Discharge Plan and Services Additional resources added to the After Visit Summary for                                       Social Drivers of Health (SDOH) Interventions SDOH Screenings   Food Insecurity: No Food Insecurity (03/05/2024)  Housing: Low Risk  (03/05/2024)  Transportation Needs: No Transportation Needs (03/05/2024)  Utilities: Not At Risk (03/05/2024)  Financial Resource Strain: Low Risk  (06/12/2019)   Received from [facility]  Physical Activity: Sufficiently Active (06/12/2019)   Received from [facility]  Social Connections: Socially Isolated (03/05/2024)  Tobacco Use: Low Risk  (03/04/2024)     Readmission Risk Interventions    12/24/2021    1:37 PM  Readmission Risk Prevention Plan  Transportation Screening Complete  PCP or Specialist Appt within 5-7 Days Complete  Home Care Screening Complete  Medication Review (RN CM) Complete

## 2024-03-09 ENCOUNTER — Observation Stay
Admission: EM | Admit: 2024-03-09 | Discharge: 2024-03-12 | Disposition: A | Discharge status: Home / Self Care | Attending: Hospitalist | Admitting: Hospitalist

## 2024-03-09 ENCOUNTER — Encounter: Payer: Self-pay | Admitting: Emergency Medicine

## 2024-03-09 ENCOUNTER — Other Ambulatory Visit: Payer: Self-pay

## 2024-03-09 DIAGNOSIS — Z79899 Other long term (current) drug therapy: Secondary | ICD-10-CM | POA: Diagnosis not present

## 2024-03-09 DIAGNOSIS — I5030 Unspecified diastolic (congestive) heart failure: Secondary | ICD-10-CM | POA: Insufficient documentation

## 2024-03-09 DIAGNOSIS — N179 Acute kidney failure, unspecified: Principal | ICD-10-CM | POA: Insufficient documentation

## 2024-03-09 DIAGNOSIS — E860 Dehydration: Secondary | ICD-10-CM

## 2024-03-09 DIAGNOSIS — R55 Syncope and collapse: Secondary | ICD-10-CM

## 2024-03-09 DIAGNOSIS — N189 Chronic kidney disease, unspecified: Secondary | ICD-10-CM | POA: Diagnosis not present

## 2024-03-09 DIAGNOSIS — R2681 Unsteadiness on feet: Secondary | ICD-10-CM | POA: Insufficient documentation

## 2024-03-09 DIAGNOSIS — N1832 Chronic kidney disease, stage 3b: Secondary | ICD-10-CM | POA: Diagnosis not present

## 2024-03-09 DIAGNOSIS — I10 Essential (primary) hypertension: Secondary | ICD-10-CM | POA: Diagnosis present

## 2024-03-09 DIAGNOSIS — I959 Hypotension, unspecified: Secondary | ICD-10-CM | POA: Diagnosis not present

## 2024-03-09 DIAGNOSIS — K219 Gastro-esophageal reflux disease without esophagitis: Secondary | ICD-10-CM | POA: Insufficient documentation

## 2024-03-09 DIAGNOSIS — I130 Hypertensive heart and chronic kidney disease with heart failure and stage 1 through stage 4 chronic kidney disease, or unspecified chronic kidney disease: Secondary | ICD-10-CM | POA: Diagnosis not present

## 2024-03-09 DIAGNOSIS — R001 Bradycardia, unspecified: Secondary | ICD-10-CM | POA: Diagnosis not present

## 2024-03-09 LAB — URINALYSIS, ROUTINE W REFLEX MICROSCOPIC
Bilirubin Urine: NEGATIVE
Glucose, UA: NEGATIVE mg/dL
Hgb urine dipstick: NEGATIVE
Ketones, ur: NEGATIVE mg/dL
Nitrite: NEGATIVE
Protein, ur: NEGATIVE mg/dL
Specific Gravity, Urine: 1.015 (ref 1.005–1.030)
WBC, UA: >50 WBC/hpf (ref 0–5)
pH: 5 (ref 5.0–8.0)

## 2024-03-09 LAB — CBC
HCT: 30.5 % — ABNORMAL LOW (ref 36.0–46.0)
Hemoglobin: 9.7 g/dL — ABNORMAL LOW (ref 12.0–15.0)
MCH: 30.3 pg (ref 26.0–34.0)
MCHC: 31.8 g/dL (ref 30.0–36.0)
MCV: 95.3 fL (ref 80.0–100.0)
Platelets: 199 10*3/uL (ref 150–400)
RBC: 3.2 MIL/uL — ABNORMAL LOW (ref 3.87–5.11)
RDW: 13.2 % (ref 11.5–15.5)
WBC: 7.2 10*3/uL (ref 4.0–10.5)
nRBC: 0 % (ref 0.0–0.2)

## 2024-03-09 LAB — COMPREHENSIVE METABOLIC PANEL
ALT: 12 U/L (ref 0–44)
AST: 16 U/L (ref 15–41)
Albumin: 3.4 g/dL — ABNORMAL LOW (ref 3.5–5.0)
Alkaline Phosphatase: 47 U/L (ref 38–126)
Anion gap: 8 (ref 5–15)
BUN: 73 mg/dL — ABNORMAL HIGH (ref 8–23)
CO2: 22 mmol/L (ref 22–32)
Calcium: 8.5 mg/dL — ABNORMAL LOW (ref 8.9–10.3)
Chloride: 106 mmol/L (ref 98–111)
Creatinine, Ser: 2.96 mg/dL — ABNORMAL HIGH (ref 0.44–1.00)
GFR, Estimated: 14 mL/min — ABNORMAL LOW (ref >60)
Glucose, Bld: 134 mg/dL — ABNORMAL HIGH (ref 70–99)
Potassium: 4.6 mmol/L (ref 3.5–5.1)
Sodium: 136 mmol/L (ref 135–145)
Total Bilirubin: 0.7 mg/dL (ref 0.0–1.2)
Total Protein: 6.3 g/dL — ABNORMAL LOW (ref 6.5–8.1)

## 2024-03-09 LAB — CBG MONITORING, ED: Glucose-Capillary: 129 mg/dL — ABNORMAL HIGH (ref 70–99)

## 2024-03-09 LAB — TROPONIN I (HIGH SENSITIVITY): Troponin I (High Sensitivity): 24 ng/L — ABNORMAL HIGH (ref <18)

## 2024-03-09 MED ORDER — ACETAMINOPHEN 325 MG PO TABS
650.0000 mg | ORAL_TABLET | Freq: Four times a day (QID) | ORAL | Status: DC | PRN
  Administered 2024-03-10 – 2024-03-12 (×4): 650 mg via ORAL
  Filled 2024-03-09 (×4): qty 2

## 2024-03-09 MED ORDER — SODIUM CHLORIDE 0.9 % IV SOLN: INTRAVENOUS | Status: AC

## 2024-03-09 MED ORDER — ONDANSETRON HCL 4 MG PO TABS: 4.0000 mg | ORAL_TABLET | Freq: Four times a day (QID) | ORAL | Status: DC | PRN

## 2024-03-09 MED ORDER — SODIUM CHLORIDE 0.9 % IV BOLUS
250.0000 mL | Freq: Once | INTRAVENOUS | Status: AC
  Administered 2024-03-09: 250 mL via INTRAVENOUS

## 2024-03-09 MED ORDER — ONDANSETRON HCL 4 MG/2ML IJ SOLN: 4.0000 mg | Freq: Four times a day (QID) | INTRAMUSCULAR | Status: DC | PRN

## 2024-03-09 MED ORDER — ENOXAPARIN SODIUM 30 MG/0.3ML IJ SOSY
30.0000 mg | PREFILLED_SYRINGE | INTRAMUSCULAR | Status: DC
  Administered 2024-03-09 – 2024-03-11 (×3): 30 mg via SUBCUTANEOUS
  Filled 2024-03-09 (×3): qty 0.3

## 2024-03-09 MED ORDER — ENOXAPARIN SODIUM 40 MG/0.4ML IJ SOSY: 40.0000 mg | PREFILLED_SYRINGE | INTRAMUSCULAR | Status: DC

## 2024-03-09 NOTE — Assessment & Plan Note (Signed)
 BP stable  Minimize BP regimen for now in setting of syncopal event and dehydration  Monitor

## 2024-03-09 NOTE — ED Triage Notes (Signed)
 Pt BIB EMS from home. Was sitting at breakfast table and had a witnessed syncopal episode and slumped forward. Daughter states was unconscious for 2 minutes. Daughter assisted pt to floor and pt started to respond. No injury. Pt complains of lightheaded no other complaints. Pt was recently discharged from hospital.

## 2024-03-09 NOTE — ED Notes (Signed)
 Pt is alert and oriented x 4. Pt denies any pain. States was nauseated some this morning but not any longer.

## 2024-03-09 NOTE — ED Provider Notes (Signed)
 [facility] Provider Note    Event Date/Time   First MD Initiated Contact with Patient 03/09/24 1223     (approximate)   History   Loss of Consciousness   HPI  Daisy Mora is a 89 y.o. female with a history of CHF, CKD who presents after a syncopal episode while sitting at the table today.  Daughter noticed her slumped over and called 911.  She helped her to the floor and patient gradually recovered.  No neurodeficits.  No chest pain, no palpitations.  Patient was just discharged from the hospital per review of records yesterday for CHF exacerbation     Physical Exam   Triage Vital Signs: ED Triage Vitals  Encounter Vitals Group     BP 03/09/24 1210 (!) 135/53     Systolic BP Percentile --      Diastolic BP Percentile --      Pulse Rate 03/09/24 1210 60     Resp 03/09/24 1210 15     Temp 03/09/24 1221 97.6 F (36.4 C)     Temp Source 03/09/24 1221 Oral     SpO2 03/09/24 1210 99 %     Weight 03/09/24 1207 59 kg (130 lb 1.1 oz)     Height 03/09/24 1207 1.6 m (5\' 3" )     Head Circumference --      Peak Flow --      Pain Score 03/09/24 1207 0     Pain Loc --      Pain Education --      Exclude from Growth Chart --     Most recent vital signs: Vitals:   03/09/24 1221 03/09/24 1436  BP:  (!) 133/51  Pulse:  (!) 59  Resp:  15  Temp: 97.6 F (36.4 C)   SpO2:  98%     General: Awake, no distress.  CV:  Good peripheral perfusion.  Regular rate and rhythm Resp:  Normal effort.  Clear to auscultation bilaterally Abd:  No distention.  Other:     ED Results / Procedures / Treatments   Labs (all labs ordered are listed, but only abnormal results are displayed) Labs Reviewed  CBC - Abnormal; Notable for the following components:      Result Value   RBC 3.20 (*)    Hemoglobin 9.7 (*)    HCT 30.5 (*)    All other components within normal limits  COMPREHENSIVE METABOLIC PANEL - Abnormal; Notable for the following components:    Glucose, Bld 134 (*)    BUN 73 (*)    Creatinine, Ser 2.96 (*)    Calcium 8.5 (*)    Total Protein 6.3 (*)    Albumin 3.4 (*)    GFR, Estimated 14 (*)    All other components within normal limits  CBG MONITORING, ED - Abnormal; Notable for the following components:   Glucose-Capillary 129 (*)    All other components within normal limits  TROPONIN I (HIGH SENSITIVITY) - Abnormal; Notable for the following components:   Troponin I (High Sensitivity) 24 (*)    All other components within normal limits  URINALYSIS, ROUTINE W REFLEX MICROSCOPIC     EKG  ED ECG REPORT I, Jene Every, the attending physician, personally viewed and interpreted this ECG.  Date: 03/09/2024  Rhythm: normal sinus rhythm QRS Axis: normal Intervals: normal ST/T Wave abnormalities: normal Narrative Interpretation: no evidence of acute ischemia    RADIOLOGY     PROCEDURES:  Critical Care  performed:   Procedures   MEDICATIONS ORDERED IN ED: Medications  0.9 %  sodium chloride infusion ( Intravenous New Bag/Given 03/09/24 1435)  ondansetron (ZOFRAN) tablet 4 mg (has no administration in time range)    Or  ondansetron (ZOFRAN) injection 4 mg (has no administration in time range)  acetaminophen (TYLENOL) tablet 650 mg (has no administration in time range)  enoxaparin (LOVENOX) injection 30 mg (has no administration in time range)  sodium chloride 0.9 % bolus 250 mL (0 mLs Intravenous Stopped 03/09/24 1433)     IMPRESSION / MDM / ASSESSMENT AND PLAN / ED COURSE  I reviewed the triage vital signs and the nursing notes. Patient's presentation is most consistent with acute presentation with potential threat to life or bodily function.  Patient presents after syncopal episode as detailed above, differential is extensive including dehydration, arrhythmia, vasovagal syncope, orthostatics  Will check labs, placed on cardiac monitor  EKG, high sensitive troponin are reassuring, chronically  elevated troponin.  Patient's creatinine is significantly elevated at 2.96 with a BUN of 73, suspect overdiuresis/dehydration is the cause of her syncopal episode.  I have discussed with the admitting hospitalist        FINAL CLINICAL IMPRESSION(S) / ED DIAGNOSES   Final diagnoses:  AKI (acute kidney injury) (HCC)  Dehydration     Rx / DC Orders   ED Discharge Orders     None        Note:  This document was prepared using Dragon voice recognition software and may include unintentional dictation errors.   Jene Every, MD 03/09/24 [phone]

## 2024-03-09 NOTE — Assessment & Plan Note (Signed)
 Noted syncopal event today with noted systolic pressures in the 90s and as well as acute on chronic kidney injury and dehydration Clinically dry on exam Orthostatics pending IV fluid hydration Otherwise monitor

## 2024-03-09 NOTE — Assessment & Plan Note (Addendum)
 2D echo March 2025 with a EF of 60 to 65% and grade 2 diastolic dysfunction Appears clinically dry with secondary hypotension Titrate regimen with hemodynamics Wt today 59kg Otherwise monitor closely

## 2024-03-09 NOTE — H&P (Addendum)
 History and Physical    Patient: Daisy Mora CZY:[id number] DOB: 1926-02-29 DOA: 03/09/2024 DOS: the patient was seen and examined on 03/09/2024 PCP: Luciana Axe, NP  Patient coming from: Home  Chief Complaint:  Chief Complaint  Patient presents with   Loss of Consciousness   HPI: Daisy Mora is a 89 y.o. female with medical history significant of chronic HFpEF, moderate MR, moderate pulmonary hypertension, refractory HTN, CKD stage IIIb, hypothyroidism chronic kidney disease, syncope.  History from patient as well as daughter at the bedside.  Per report, patient was eating a late breakfast when patient felt extremely weak and fatigued and had a transient episode of syncope at the breakfast table.  Mild generalized confusion.  No chest pain.  No focal hemiparesis.  Noted to have been admitted March 10 of March 14 for acute on chronic HFpEF with aggressive diuresis.  Family's been compliant with discharge regimen though has not taken Lasix in 24 hours.  No abdominal pain or diarrhea.  Patient does report decreased p.o. intake over the past 24 hours.  No reported falls or head trauma.  Syncopal episode/symptoms lasted roughly 2 to 3 minutes. Presented to the ER afebrile, hemodynamically stable.  Satting well on room air.  White count 7.2, hemoglobin 9.7, platelets 199, creatinine 2.96, glucose 134, troponin 24.  EKG normal sinus rhythm. Review of Systems: As mentioned in the history of present illness. All other systems reviewed and are negative. Past Medical History:  Diagnosis Date   Hypertension    Past Surgical History:  Procedure Laterality Date   ABDOMINAL HYSTERECTOMY     APPENDECTOMY     BREAST SURGERY     INTRAMEDULLARY (IM) NAIL INTERTROCHANTERIC Left 12/23/2021   Procedure: INTRAMEDULLARY (IM) NAIL INTERTROCHANTRIC;  Surgeon: Juanell Fairly, MD;  Location: [facility] ORS;  Service: Orthopedics;  Laterality: Left;   Social History:  reports that she has never smoked. She  has never used smokeless tobacco. She reports that she does not drink alcohol and does not use drugs.  Allergies  Allergen Reactions   Aspirin Tinitus   Losartan Potassium-Hctz Other (See Comments)    History reviewed. No pertinent family history.  Prior to Admission medications   Medication Sig Start Date End Date Taking? Authorizing Provider  acetaminophen (TYLENOL) 325 MG tablet Take 1-2 tablets (325-650 mg total) by mouth every 6 (six) hours as needed for mild pain (pain score 1-3 or temp > 100.5). 12/28/21   Azucena Fallen, MD  amLODipine (NORVASC) 10 MG tablet Take 10 mg by mouth daily.    [provider]  carvedilol (COREG) 25 MG tablet Take 0.5 tablets (12.5 mg total) by mouth 2 (two) times daily with a meal. 03/08/24   Loyce Dys, MD  Cholecalciferol 50 MCG (2000 UT) CAPS Take 1 capsule by mouth daily. 02/14/23   [provider]  cloNIDine (CATAPRES) 0.1 MG tablet Take 0.1 mg by mouth 2 (two) times daily.    [provider]  cyanocobalamin 500 MCG tablet Take 500 mcg by mouth daily.    [provider]  hydrALAZINE (APRESOLINE) 50 MG tablet Take 1 tablet (50 mg total) by mouth every 8 (eight) hours. 03/08/24   Loyce Dys, MD  isosorbide mononitrate (IMDUR) 60 MG 24 hr tablet Take 1 tablet (60 mg total) by mouth daily. 03/09/24   Loyce Dys, MD  levothyroxine (SYNTHROID) 25 MCG tablet Take 25 mcg by mouth daily before breakfast. 02/12/24   [provider]  Multiple  Vitamin (MULTIVITAMIN) capsule Take 1 capsule by mouth daily.    [provider]  omeprazole (PRILOSEC OTC) 20 MG tablet Take 20 mg by mouth daily.    [provider]  polyethylene glycol (MIRALAX / GLYCOLAX) 17 g packet Take 17 g by mouth daily as needed for mild constipation. 12/28/21   Azucena Fallen, MD  RESTASIS 0.05 % ophthalmic emulsion Place 1 drop into both eyes 2 (two) times daily. 05/31/21   [provider]  solifenacin  (VESICARE) 5 MG tablet Take 5 mg by mouth daily. 01/26/24   [provider]  Turmeric 400 MG CAPS Take by mouth.    [provider]    Physical Exam: Vitals:   03/09/24 1207 03/09/24 1210 03/09/24 1221  BP:  (!) 135/53   Pulse:  60   Resp:  15   Temp:   97.6 F (36.4 C)  TempSrc:   Oral  SpO2:  99%   Weight: 59 kg    Height: 5\' 3"  (1.6 m)     Physical Exam Constitutional:      Appearance: She is normal weight.  HENT:     Head: Normocephalic and atraumatic.     Nose: Nose normal.     Mouth/Throat:     Mouth: Mucous membranes are moist.  Eyes:     Pupils: Pupils are equal, round, and reactive to light.  Cardiovascular:     Rate and Rhythm: Normal rate and regular rhythm.     Pulses: Normal pulses.  Pulmonary:     Effort: Pulmonary effort is normal.  Abdominal:     General: Bowel sounds are normal.  Musculoskeletal:        General: Normal range of motion.  Skin:    General: Skin is warm.  Neurological:     General: No focal deficit present.  Psychiatric:        Mood and Affect: Mood normal.     Data Reviewed:  There are no new results to review at this time.   Lab Results  Component Value Date   WBC 7.2 03/09/2024   HGB 9.7 (L) 03/09/2024   HCT 30.5 (L) 03/09/2024   MCV 95.3 03/09/2024   PLT 199 03/09/2024   Last metabolic panel Lab Results  Component Value Date   GLUCOSE 134 (H) 03/09/2024   NA 136 03/09/2024   K 4.6 03/09/2024   CL 106 03/09/2024   CO2 22 03/09/2024   BUN 73 (H) 03/09/2024   CREATININE 2.96 (H) 03/09/2024   GFRNONAA 14 (L) 03/09/2024   CALCIUM 8.5 (L) 03/09/2024   PROT 6.3 (L) 03/09/2024   ALBUMIN 3.4 (L) 03/09/2024   BILITOT 0.7 03/09/2024   ALKPHOS 47 03/09/2024   AST 16 03/09/2024   ALT 12 03/09/2024   ANIONGAP 8 03/09/2024    Assessment and Plan: * Acute kidney injury superimposed on chronic kidney disease (HCC) Creatinine 2.96 today with baseline creatinine around 1.5 Suspect overdiuresis in  setting of recent episode of volume overload with secondary syncopal event from hypotension Will hold diuretics for now Gentle IV fluid hydration Check FEUN to correlate Renal ultrasound Monitor  Syncope Noted syncopal event today with noted systolic pressures in the 90s and as well as acute on chronic kidney injury and dehydration Clinically dry on exam Orthostatics pending IV fluid hydration Otherwise monitor  Essential hypertension BP stable  Minimize BP regimen for now in setting of syncopal event and dehydration  Monitor   (HFpEF) heart failure with preserved ejection  fraction [facility]) 2D echo March 2025 with a EF of 60 to 65% and grade 2 diastolic dysfunction Appears clinically dry with secondary hypotension Titrate regimen with hemodynamics Wt today 59kg Otherwise monitor closely  GERD (gastroesophageal reflux disease) PPI      Advance Care Planning:   Code Status: Limited: Do not attempt resuscitation (DNR) -DNR-LIMITED -Do Not Intubate/DNI    Consults: None   Family Communication: Daughter at the bedside   Severity of Illness: The appropriate patient status for this patient is OBSERVATION. Observation status is judged to be reasonable and necessary in order to provide the required intensity of service to ensure the patient's safety. The patient's presenting symptoms, physical exam findings, and initial radiographic and laboratory data in the context of their medical condition is felt to place them at decreased risk for further clinical deterioration. Furthermore, it is anticipated that the patient will be medically stable for discharge from the hospital within 2 midnights of admission.   Author: Floydene Flock, MD 03/09/2024 2:14 PM  For on call review [email or web address].

## 2024-03-09 NOTE — Assessment & Plan Note (Signed)
 Creatinine 2.96 today with baseline creatinine around 1.5 Suspect overdiuresis in setting of recent episode of volume overload with secondary syncopal event from hypotension Will hold diuretics for now Gentle IV fluid hydration Check FEUN to correlate Renal ultrasound Monitor

## 2024-03-09 NOTE — Assessment & Plan Note (Signed)
 PPI ?

## 2024-03-10 DIAGNOSIS — N189 Chronic kidney disease, unspecified: Secondary | ICD-10-CM | POA: Diagnosis not present

## 2024-03-10 DIAGNOSIS — N179 Acute kidney failure, unspecified: Secondary | ICD-10-CM | POA: Diagnosis not present

## 2024-03-10 LAB — CBC
HCT: 26.2 % — ABNORMAL LOW (ref 36.0–46.0)
Hemoglobin: 8.6 g/dL — ABNORMAL LOW (ref 12.0–15.0)
MCH: 30.4 pg (ref 26.0–34.0)
MCHC: 32.8 g/dL (ref 30.0–36.0)
MCV: 92.6 fL (ref 80.0–100.0)
Platelets: 183 10*3/uL (ref 150–400)
RBC: 2.83 MIL/uL — ABNORMAL LOW (ref 3.87–5.11)
RDW: 13.1 % (ref 11.5–15.5)
WBC: 6.1 10*3/uL (ref 4.0–10.5)
nRBC: 0 % (ref 0.0–0.2)

## 2024-03-10 LAB — COMPREHENSIVE METABOLIC PANEL
ALT: 12 U/L (ref 0–44)
AST: 13 U/L — ABNORMAL LOW (ref 15–41)
Albumin: 3 g/dL — ABNORMAL LOW (ref 3.5–5.0)
Alkaline Phosphatase: 43 U/L (ref 38–126)
Anion gap: 7 (ref 5–15)
BUN: 74 mg/dL — ABNORMAL HIGH (ref 8–23)
CO2: 19 mmol/L — ABNORMAL LOW (ref 22–32)
Calcium: 8.1 mg/dL — ABNORMAL LOW (ref 8.9–10.3)
Chloride: 109 mmol/L (ref 98–111)
Creatinine, Ser: 2.41 mg/dL — ABNORMAL HIGH (ref 0.44–1.00)
GFR, Estimated: 18 mL/min — ABNORMAL LOW (ref >60)
Glucose, Bld: 96 mg/dL (ref 70–99)
Potassium: 4.4 mmol/L (ref 3.5–5.1)
Sodium: 135 mmol/L (ref 135–145)
Total Bilirubin: 0.9 mg/dL (ref 0.0–1.2)
Total Protein: 5.7 g/dL — ABNORMAL LOW (ref 6.5–8.1)

## 2024-03-10 LAB — SODIUM, URINE, RANDOM: Sodium, Ur: 23 mmol/L

## 2024-03-10 LAB — CREATININE, URINE, RANDOM: Creatinine, Urine: 118 mg/dL

## 2024-03-10 MED ORDER — ALUM & MAG HYDROXIDE-SIMETH 200-200-20 MG/5ML PO SUSP
15.0000 mL | Freq: Four times a day (QID) | ORAL | Status: DC | PRN
  Administered 2024-03-10: 15 mL via ORAL
  Filled 2024-03-10 (×2): qty 30

## 2024-03-10 NOTE — Evaluation (Signed)
 Physical Therapy Evaluation Patient Details Name: Daisy Mora MRN: [id number] DOB: Sep 17, 1926 Today's Date: 03/10/2024  History of Present Illness  Daisy Mora is a 89 y.o. female with medical history significant of chronic HFpEF, moderate MR, moderate pulmonary hypertension, refractory HTN, CKD stage IIIb, hypothyroidism chronic kidney disease, syncope.  History from patient as well as daughter at the bedside.  Per report, patient was eating a late breakfast when patient felt extremely weak and fatigued and had a transient episode of syncope at the breakfast table.  Mild generalized confusion.  No chest pain.  No focal hemiparesis.  Noted to have been admitted March 10 of March 14 for acute on chronic HFpEF with aggressive diuresis.   Clinical Impression  Patient received in bed, daughter at bedside. Patient is agreeable to PT session. She reports she does not have any dizziness with mobility. Patient required min A for bed mobility. Transferred to [facility] due to being soiled in bed. Then ambulated with RW 100 feet and supervision. She appears to be close to baseline. She will continue to benefit from skilled PT while here to improve mobility and strength.          If plan is discharge home, recommend the following: A little help with walking and/or transfers;A little help with bathing/dressing/bathroom;Assistance with cooking/housework;Direct supervision/assist for medications management;Assist for transportation   Can travel by private vehicle    yes    Equipment Recommendations None recommended by PT  Recommendations for Other Services       Functional Status Assessment Patient has had a recent decline in their functional status and demonstrates the ability to make significant improvements in function in a reasonable and predictable amount of time.     Precautions / Restrictions Precautions Precaution/Restrictions Comments: mod fall Restrictions Weight Bearing Restrictions Per  Provider Order: No      Mobility  Bed Mobility   Bed Mobility: Supine to Sit     Supine to sit: Min assist          Transfers Overall transfer level: Needs assistance Equipment used: Rolling walker (2 wheels) Transfers: Sit to/from Stand Sit to Stand: Supervision           General transfer comment: Good eccentric and concentric control and stability with no cues needed for hand placement    Ambulation/Gait Ambulation/Gait assistance: Supervision Gait Distance (Feet): 100 Feet Assistive device: Rolling walker (2 wheels) Gait Pattern/deviations: Decreased step length - right, Decreased step length - left, Decreased stride length Gait velocity: decreased     General Gait Details: No LOB, good safety awareness  Stairs            Wheelchair Mobility     Tilt Bed    Modified Rankin (Stroke Patients Only)       Balance Overall balance assessment: Needs assistance Sitting-balance support: Feet supported Sitting balance-Leahy Scale: Normal                                       Pertinent Vitals/Pain Pain Assessment Pain Assessment: No/denies pain    Home Living Family/patient expects to be discharged to:: Private residence Living Arrangements: Children (daughters are going to provide 24/7 assist when discharged) Available Help at Discharge: Family;Available 24 hours/day Type of Home: House Home Access: Level entry       Home Layout: One level Home Equipment: BSC/3in1;Cane - quad;Rollator (4 wheels) Additional Comments: Has a bed  rail    Prior Function Prior Level of Function : Independent/Modified Independent             Mobility Comments: Mod Ind amb with a QC/RW in the home and with a rollator in the community, no fall history ADLs Comments: Ind with ADLs     Extremity/Trunk Assessment   Upper Extremity Assessment Upper Extremity Assessment: Overall WFL for tasks assessed    Lower Extremity Assessment Lower  Extremity Assessment: Overall WFL for tasks assessed    Cervical / Trunk Assessment Cervical / Trunk Assessment: Normal  Communication   Communication Communication: No apparent difficulties Factors Affecting Communication: Hearing impaired    Cognition Arousal: Alert Behavior During Therapy: WFL for tasks assessed/performed   PT - Cognitive impairments: No apparent impairments                         Following commands: Intact       Cueing Cueing Techniques: Verbal cues     General Comments      Exercises     Assessment/Plan    PT Assessment Patient needs continued PT services  PT Problem List Decreased mobility;Decreased strength       PT Treatment Interventions DME instruction;Gait training;Functional mobility training;Therapeutic activities;Therapeutic exercise;Balance training;Neuromuscular re-education;Patient/family education    PT Goals (Current goals can be found in the Care Plan section)  Acute Rehab PT Goals Patient Stated Goal: To return home PT Goal Formulation: With patient/family Time For Goal Achievement: 03/18/24 Potential to Achieve Goals: Good    Frequency Min 2X/week     Co-evaluation               AM-PAC PT "6 Clicks" Mobility  Outcome Measure Help needed turning from your back to your side while in a flat bed without using bedrails?: A Little Help needed moving from lying on your back to sitting on the side of a flat bed without using bedrails?: A Little Help needed moving to and from a bed to a chair (including a wheelchair)?: A Little Help needed standing up from a chair using your arms (e.g., wheelchair or bedside chair)?: A Little Help needed to walk in hospital room?: A Little Help needed climbing 3-5 steps with a railing? : A Little 6 Click Score: 18    End of Session Equipment Utilized During Treatment: Gait belt Activity Tolerance: Patient tolerated treatment well Patient left: in chair;with call bell/phone  within reach;with family/visitor present Nurse Communication: Mobility status PT Visit Diagnosis: Muscle weakness (generalized) (M62.81);Difficulty in walking, not elsewhere classified (R26.2)    Time: 1610-9604 PT Time Calculation (min) (ACUTE ONLY): 18 min   Charges:   PT Evaluation $PT Eval Moderate Complexity: 1 Mod   PT General Charges $$ ACUTE PT VISIT: 1 Visit         Holman Bonsignore, PT, GCS 03/10/24,12:35 PM

## 2024-03-10 NOTE — Care Management Obs Status (Signed)
 MEDICARE OBSERVATION STATUS NOTIFICATION   Patient Details  Name: Daisy Mora MRN: [id number] Date of Birth: 02/02/1926   Medicare Observation Status Notification Given:  Yes    Oshae Simmering E Cyla Haluska, LCSW 03/10/2024, 10:50 AM

## 2024-03-10 NOTE — TOC Initial Note (Addendum)
 Transition of Care [facility]) - Initial/Assessment Note    Patient Details  Name: Daisy Mora MRN: [id number] Date of Birth: 1926-06-19  Transition of Care [facility]) CM/SW Contact:    Liliana Cline, LCSW Phone Number: 03/10/2024, 10:57 AM  Clinical Narrative:                 CSW met with patient at bedside. Patient was just discharged on 3/14 with [facility]. Patient states they have not had the chance to come out yet. TOC to notify [facility] when patient discharges from this stay. Notified [facility] Rep Laurelyn Sickle of patient being back at hospital.  Expected Discharge Plan: Home w Home Health Services Barriers to Discharge: Continued Medical Work up   Patient Goals and CMS Choice   CMS Medicare.gov Compare Post Acute Care list provided to:: Patient Choice offered to / list presented to : Patient [place] ownership interest in [facility].provided to:: Patient    Expected Discharge Plan and Services       Living arrangements for the past 2 months: Single Family Home                             HH Agency: [facility]        Prior Living Arrangements/Services Living arrangements for the past 2 months: Single Family Home Lives with:: Self Patient language and need for interpreter reviewed:: Yes Do you feel safe going back to the place where you live?: Yes      Need for Family Participation in Patient Care: Yes (Comment) Care giver support system in place?: Yes (comment) Current home services: DME Criminal Activity/Legal Involvement Pertinent to Current Situation/Hospitalization: No - Comment as needed  Activities of Daily Living   ADL Screening (condition at time of admission) Independently performs ADLs?: Yes (appropriate for developmental age) Is the patient deaf or have difficulty hearing?: No Does the patient have difficulty seeing, even when wearing glasses/contacts?: No Does the patient have difficulty concentrating, remembering, or  making decisions?: No  Permission Sought/Granted Permission sought to share information with : Facility Medical sales representative, Family Supports Permission granted to share information with : Yes, Verbal Permission Granted     Permission granted to share info w AGENCY: [facility]  Permission granted to share info w Relationship: daughter     Emotional Assessment       Orientation: : Oriented to Self, Oriented to Place, Oriented to  Time, Oriented to Situation Alcohol / Substance Use: Not Applicable Psych Involvement: No (comment)  Admission diagnosis:  Dehydration [E86.0] AKI (acute kidney injury) (HCC) [N17.9] Patient Active Problem List   Diagnosis Date Noted   Acute kidney injury superimposed on chronic kidney disease (HCC) 03/09/2024   Syncope 03/09/2024   (HFpEF) heart failure with preserved ejection fraction (HCC) 03/09/2024   Demand ischemia (HCC) 03/05/2024   Chronic kidney disease (CKD), stage III (moderate) (HCC) 03/05/2024   Physical deconditioning 03/05/2024   Acute on chronic heart failure with preserved ejection fraction (HFpEF) (HCC) 03/04/2024   CHF (congestive heart failure) (HCC) 03/04/2024   Acquired hypothyroidism 12/23/2021   Femur fracture, left (HCC) 12/22/2021   Essential hypertension 12/22/2021   GERD (gastroesophageal reflux disease) 12/22/2021   PCP:  Luciana Axe, NP Pharmacy:   [facility] - [place], [place] - [address] [place] [address] Phone: [phone] Fax: [phone]     Social Drivers of Health (  SDOH) Social History: SDOH Screenings   Food Insecurity: No Food Insecurity (03/09/2024)  Housing: Low Risk  (03/09/2024)  Transportation Needs: No Transportation Needs (03/09/2024)  Utilities: Not At Risk (03/09/2024)  Financial Resource Strain: Low Risk  (06/12/2019)   Received from [facility]  Physical Activity: Sufficiently Active (06/12/2019)   Received from [facility]   Social Connections: Socially Isolated (03/09/2024)  Tobacco Use: Low Risk  (03/09/2024)   SDOH Interventions:     Readmission Risk Interventions    12/24/2021    1:37 PM  Readmission Risk Prevention Plan  Transportation Screening Complete  PCP or Specialist Appt within 5-7 Days Complete  Home Care Screening Complete  Medication Review (RN CM) Complete

## 2024-03-10 NOTE — Progress Notes (Signed)
 Progress Note   Patient: Daisy Mora ZOX:[id number] DOB: 03-Apr-1926 DOA: 03/09/2024     0 DOS: the patient was seen and examined on 03/10/2024   Brief hospital course:  89 y.o. female with medical history significant of chronic HFpEF, moderate MR, moderate pulmonary hypertension, refractory HTN, CKD stage IIIb, hypothyroidism chronic kidney disease, syncope.  History from patient as well as daughter at the bedside.  Per report, patient was eating a late breakfast when patient felt extremely weak and fatigued and had a transient episode of syncope at the breakfast table.  Mild generalized confusion.  No chest pain.  No focal hemiparesis.  Noted to have been admitted March 10 of March 14 for acute on chronic HFpEF with aggressive diuresis.  Family's been compliant with discharge regimen though has not taken Lasix in 24 hours.  No abdominal pain or diarrhea.  Patient does report decreased p.o. intake over the past 24 hours.  No reported falls or head trauma.  Syncopal episode/symptoms lasted roughly 2 to 3 minutes. Presented to the ER afebrile, hemodynamically stable.  Satting well on room air.  White count 7.2, hemoglobin 9.7, platelets 199, creatinine 2.96, glucose 134, troponin 24.  EKG normal sinus rhythm.  3/16 : Patient had improvement in creatinine now 2.41.  Baseline around 1.5.  Patient asymptomatic with active no complaint.  Assessment and Plan: Acute kidney injury superimposed on chronic kidney disease Presents with creatinine 2.96  with baseline creatinine around 1.5 Likely from diuresis. Will hold diuretics  Gentle IV fluid hydration Renal ultrasound- pending Will continue to monitor with serial BMP  Syncope Noted syncopal event today with noted systolic pressures in the 90s and as well as acute on chronic kidney injury and dehydration Clinically dry on exam Orthostatics pending IV fluid hydration Otherwise monitor  Essential hypertension BP stable  Minimize BP regimen for  now in setting of syncopal event and dehydration  Monitor   (HFpEF) heart failure with preserved ejection fraction  2D echo March 2025 with a EF of 60 to 65% and grade 2 diastolic dysfunction Appears clinically dry with secondary hypotension Titrate regimen with hemodynamics Wt today 59kg Otherwise monitor closely  GERD (gastroesophageal reflux disease) PPI     Subjective: Patient seen and examined morning rounds.  No overnight events.  Had improvement in creatinine.   Physical Exam: Vitals:   03/09/24 1941 03/09/24 2139 03/10/24 0403 03/10/24 0748  BP:  (!) 150/50 (!) 155/50 (!) 163/55  Pulse:  66 72 73  Resp: 18 20  18   Temp: 97.8 F (36.6 C) 98 F (36.7 C) 98.5 F (36.9 C) 98.2 F (36.8 C)  TempSrc: Oral Oral    SpO2:  96% 93% 93%  Weight:      Height:       Physical Exam HENT:     Head: Normocephalic and atraumatic.     Nose: Nose normal.     Mouth/Throat:     Mouth: Mucous membranes are moist.  Eyes:     Extraocular Movements: Extraocular movements intact.     Pupils: Pupils are equal, round, and reactive to light.  Cardiovascular:     Rate and Rhythm: Normal rate.  Pulmonary:     Effort: Pulmonary effort is normal.  Abdominal:     Palpations: Abdomen is soft.  Musculoskeletal:        General: Normal range of motion.     Cervical back: Normal range of motion.  Skin:    General: Skin is warm.  Neurological:  General: No focal deficit present.     Mental Status: She is alert and oriented to person, place, and time. Mental status is at baseline.  Psychiatric:        Mood and Affect: Mood normal.     Data Reviewed:  There are no new results to review at this time.  Family Communication: Daughter updated about  Disposition: Status is: Observation The patient remains OBS appropriate and will d/c before 2 midnights.  Planned Discharge Destination: Home    Time spent: 32 minutes minutes  Author: Kirstie Peri, MD 03/10/2024 12:06 PM  For  on call review [email or web address].

## 2024-03-10 NOTE — Evaluation (Signed)
 Occupational Therapy Evaluation Patient Details Name: Daisy Mora MRN: [id number] DOB: [birth year] Today's Date: 03/10/2024   History of Present Illness   DARIEL PELLECCHIA is a 89 y.o. female with medical history significant of chronic HFpEF, moderate MR, moderate pulmonary hypertension, refractory HTN, CKD stage IIIb, hypothyroidism chronic kidney disease, syncope. Per report, patient was eating a late breakfast when patient felt extremely weak and fatigued and had a transient episode of syncope at the breakfast table. Noted to have been admitted March 10 of March 14 for acute on chronic HFpEF with aggressive diuresis.    Clinical Impressions Ms Pelto was seen for OT evaluation this date. Prior to hospital admission, pt was MOD I using RW. Pt lives alone, daughters available for ~3 weeks. Pt currently requires MIN A exit bed, assist 2/2 stool incontinence. SUPERVISION don underwear in sitting. CGA + RW for toilet t/f. Pt would benefit from skilled OT to address noted impairments and functional limitations (see below for any additional details). Upon hospital discharge, recommend OT follow up and assistance at home with IADLs.     If plan is discharge home, recommend the following:   A little help with walking and/or transfers;A little help with bathing/dressing/bathroom;Assistance with cooking/housework;Assist for transportation;Help with stairs or ramp for entrance     Functional Status Assessment   Patient has had a recent decline in their functional status and demonstrates the ability to make significant improvements in function in a reasonable and predictable amount of time.     Equipment Recommendations   None recommended by OT     Recommendations for Other Services         Precautions/Restrictions   Precautions Precautions: Fall Recall of Precautions/Restrictions: Intact Restrictions Weight Bearing Restrictions Per Provider Order: No     Mobility Bed  Mobility Overal bed mobility: Needs Assistance Bed Mobility: Supine to Sit     Supine to sit: Min assist          Transfers Overall transfer level: Needs assistance Equipment used: Rolling walker (2 wheels) Transfers: Sit to/from Stand Sit to Stand: Supervision                  Balance Overall balance assessment: Needs assistance Sitting-balance support: Feet supported Sitting balance-Leahy Scale: Normal     Standing balance support: No upper extremity supported Standing balance-Leahy Scale: Fair                             ADL either performed or assessed with clinical judgement   ADL Overall ADL's : Needs assistance/impaired                                       General ADL Comments: SUPERVISION don underwear in sitting. CGA + RW for toilet t/f                  Pertinent Vitals/Pain Pain Assessment Pain Assessment: No/denies pain     Extremity/Trunk Assessment Upper Extremity Assessment Upper Extremity Assessment: Overall WFL for tasks assessed   Lower Extremity Assessment Lower Extremity Assessment: Overall WFL for tasks assessed   Cervical / Trunk Assessment Cervical / Trunk Assessment: Normal   Communication Communication Communication: No apparent difficulties Factors Affecting Communication: Hearing impaired   Cognition Arousal: Alert Behavior During Therapy: WFL for tasks assessed/performed Cognition: No apparent impairments  Following commands: Intact       Cueing  General Comments   Cueing Techniques: Verbal cues      Exercises     Shoulder Instructions      Home Living Family/patient expects to be discharged to:: Private residence Living Arrangements: Alone Available Help at Discharge: Family;Available 24 hours/day Type of Home: House Home Access: Level entry     Home Layout: One level     Bathroom Shower/Tub: Contractor: Handicapped height     Home Equipment: BSC/3in1;Cane - quad;Rollator (4 wheels)   Additional Comments: 3 daughters report plan to provide 24/7 for ~3 weeks      Prior Functioning/Environment Prior Level of Function : Independent/Modified Independent             Mobility Comments: Mod Ind amb with a QC/RW in the home and with a rollator in the community, no fall history ADLs Comments: Ind with ADLs    OT Problem List: Decreased strength;Decreased activity tolerance;Impaired balance (sitting and/or standing);Decreased coordination;Decreased knowledge of use of DME or AE;Decreased safety awareness   OT Treatment/Interventions: Self-care/ADL training;DME and/or AE instruction;Energy conservation;Therapeutic activities      OT Goals(Current goals can be found in the care plan section)   Acute Rehab OT Goals Patient Stated Goal: to go home OT Goal Formulation: With patient/family Time For Goal Achievement: 03/24/24 Potential to Achieve Goals: Good ADL Goals Pt Will Perform Grooming: with modified independence;standing Pt Will Perform Lower Body Dressing: with modified independence;sit to/from stand Pt Will Transfer to Toilet: with modified independence;ambulating;regular height toilet   OT Frequency:  Min 2X/week    Co-evaluation PT/OT/SLP Co-Evaluation/Treatment: Yes Reason for Co-Treatment: For patient/therapist safety PT goals addressed during session: Mobility/safety with mobility OT goals addressed during session: ADL's and self-care      AM-PAC OT "6 Clicks" Daily Activity     Outcome Measure Help from another person eating meals?: None Help from another person taking care of personal grooming?: A Little Help from another person toileting, which includes using toliet, bedpan, or urinal?: A Little Help from another person bathing (including washing, rinsing, drying)?: A Lot Help from another person to put on and taking off regular upper body clothing?:  None Help from another person to put on and taking off regular lower body clothing?: A Little 6 Click Score: 19   End of Session Equipment Utilized During Treatment: Rolling walker (2 wheels) Nurse Communication: Mobility status  Activity Tolerance: Patient tolerated treatment well Patient left: in chair;with call bell/phone within reach;with family/visitor present  OT Visit Diagnosis: Unsteadiness on feet (R26.81);Other abnormalities of gait and mobility (R26.89);Muscle weakness (generalized) (M62.81)                Time: 5784-6962 OT Time Calculation (min): 21 min Charges:  OT General Charges $OT Visit: 1 Visit OT Evaluation $OT Eval Low Complexity: 1 Low  Kathie Dike, M.S. OTR/L  03/10/24, 1:29 PM  ascom [phone]

## 2024-03-10 NOTE — Plan of Care (Signed)
  Problem: Education: Goal: Knowledge of General Education information will improve Description: Including pain rating scale, medication(s)/side effects and non-pharmacologic comfort measures Outcome: Not Progressing   Problem: Health Behavior/Discharge Planning: Goal: Ability to manage health-related needs will improve Outcome: Not Progressing   Problem: Clinical Measurements: Goal: Ability to maintain clinical measurements within normal limits will improve Outcome: Not Progressing Goal: Will remain free from infection Outcome: Not Progressing Goal: Diagnostic test results will improve Outcome: Not Progressing Goal: Respiratory complications will improve Outcome: Not Progressing Goal: Cardiovascular complication will be avoided Outcome: Not Progressing   Problem: Elimination: Goal: Will not experience complications related to bowel motility Outcome: Not Progressing Goal: Will not experience complications related to urinary retention Outcome: Not Progressing   Problem: Pain Managment: Goal: General experience of comfort will improve and/or be controlled Outcome: Not Progressing

## 2024-03-11 DIAGNOSIS — I959 Hypotension, unspecified: Secondary | ICD-10-CM | POA: Diagnosis not present

## 2024-03-11 DIAGNOSIS — R55 Syncope and collapse: Secondary | ICD-10-CM | POA: Diagnosis not present

## 2024-03-11 DIAGNOSIS — N189 Chronic kidney disease, unspecified: Secondary | ICD-10-CM | POA: Diagnosis not present

## 2024-03-11 DIAGNOSIS — N179 Acute kidney failure, unspecified: Secondary | ICD-10-CM | POA: Diagnosis not present

## 2024-03-11 DIAGNOSIS — I5030 Unspecified diastolic (congestive) heart failure: Secondary | ICD-10-CM | POA: Diagnosis not present

## 2024-03-11 DIAGNOSIS — I10 Essential (primary) hypertension: Secondary | ICD-10-CM | POA: Diagnosis not present

## 2024-03-11 LAB — BASIC METABOLIC PANEL
Anion gap: 8 (ref 5–15)
BUN: 70 mg/dL — ABNORMAL HIGH (ref 8–23)
CO2: 19 mmol/L — ABNORMAL LOW (ref 22–32)
Calcium: 8.4 mg/dL — ABNORMAL LOW (ref 8.9–10.3)
Chloride: 111 mmol/L (ref 98–111)
Creatinine, Ser: 1.98 mg/dL — ABNORMAL HIGH (ref 0.44–1.00)
GFR, Estimated: 23 mL/min — ABNORMAL LOW (ref >60)
Glucose, Bld: 85 mg/dL (ref 70–99)
Potassium: 4.6 mmol/L (ref 3.5–5.1)
Sodium: 138 mmol/L (ref 135–145)

## 2024-03-11 LAB — UREA NITROGEN, URINE: Urea Nitrogen, Ur: 632 mg/dL

## 2024-03-11 MED ORDER — AMLODIPINE BESYLATE 10 MG PO TABS
10.0000 mg | ORAL_TABLET | Freq: Every day | ORAL | Status: DC
  Administered 2024-03-11 – 2024-03-12 (×2): 10 mg via ORAL
  Filled 2024-03-11 (×2): qty 1

## 2024-03-11 MED ORDER — LEVOTHYROXINE SODIUM 50 MCG PO TABS
25.0000 ug | ORAL_TABLET | Freq: Every day | ORAL | Status: DC
  Administered 2024-03-11 – 2024-03-12 (×2): 25 ug via ORAL
  Filled 2024-03-11 (×2): qty 1

## 2024-03-11 MED ORDER — HYDRALAZINE HCL 50 MG PO TABS
50.0000 mg | ORAL_TABLET | Freq: Three times a day (TID) | ORAL | Status: DC
  Administered 2024-03-11 – 2024-03-12 (×4): 50 mg via ORAL
  Filled 2024-03-11 (×4): qty 1

## 2024-03-11 MED ORDER — PANTOPRAZOLE SODIUM 40 MG PO TBEC
40.0000 mg | DELAYED_RELEASE_TABLET | Freq: Every day | ORAL | Status: DC
  Administered 2024-03-11 – 2024-03-12 (×2): 40 mg via ORAL
  Filled 2024-03-11 (×2): qty 1

## 2024-03-11 MED ORDER — CARVEDILOL 12.5 MG PO TABS
12.5000 mg | ORAL_TABLET | Freq: Two times a day (BID) | ORAL | Status: DC
  Administered 2024-03-11 – 2024-03-12 (×3): 12.5 mg via ORAL
  Filled 2024-03-11 (×3): qty 1

## 2024-03-11 MED ORDER — ISOSORBIDE MONONITRATE ER 30 MG PO TB24
60.0000 mg | ORAL_TABLET | Freq: Every day | ORAL | Status: DC
  Administered 2024-03-11 – 2024-03-12 (×2): 60 mg via ORAL
  Filled 2024-03-11 (×2): qty 2

## 2024-03-11 NOTE — Progress Notes (Signed)
 Physical Therapy Treatment Patient Details Name: Daisy Mora MRN: [id number] DOB: 21-Jun-1926 Today's Date: 03/11/2024   History of Present Illness Daisy Mora is a 89 y.o. female with medical history significant of chronic HFpEF, moderate MR, moderate pulmonary hypertension, refractory HTN, CKD stage IIIb, hypothyroidism chronic kidney disease, syncope.  History from patient as well as daughter at the bedside.  Per report, patient was eating a late breakfast when patient felt extremely weak and fatigued and had a transient episode of syncope at the breakfast table.  Mild generalized confusion.  No chest pain.  No focal hemiparesis.  Noted to have been admitted March 10 of March 14 for acute on chronic HFpEF with aggressive diuresis.    PT Comments  Patient is agreeable to PT session. Supportive family at the bedside. Increased gait distance this session. Patient walked 145ft with rolling walker with supervision for safety. Encouraged patient to continue using rolling walker for safety and fall prevention. No dizziness with hallway ambulation. Recommend to continue PT to maximize independence and facilitate return to prior level of function.    If plan is discharge home, recommend the following: A little help with walking and/or transfers;A little help with bathing/dressing/bathroom;Assistance with cooking/housework;Direct supervision/assist for medications management;Assist for transportation   Can travel by private vehicle        Equipment Recommendations  None recommended by PT    Recommendations for Other Services       Precautions / Restrictions Precautions Precautions: Fall Recall of Precautions/Restrictions: Intact Restrictions Weight Bearing Restrictions Per Provider Order: No     Mobility  Bed Mobility Overal bed mobility: Needs Assistance Bed Mobility: Supine to Sit     Supine to sit: Contact guard Sit to supine: Min assist   General bed mobility comments:  assistance for LE support to return to bed    Transfers Overall transfer level: Needs assistance Equipment used: Rolling walker (2 wheels) Transfers: Sit to/from Stand Sit to Stand: Supervision           General transfer comment: good safety awareness with transfers    Ambulation/Gait Ambulation/Gait assistance: Supervision Gait Distance (Feet): 120 Feet Assistive device: Rolling walker (2 wheels) Gait Pattern/deviations: Step-through pattern Gait velocity: decreased     General Gait Details: slow but steady with no loss of balance   Stairs             Wheelchair Mobility     Tilt Bed    Modified Rankin (Stroke Patients Only)       Balance Overall balance assessment: Needs assistance Sitting-balance support: Feet supported Sitting balance-Leahy Scale: Normal     Standing balance support: No upper extremity supported Standing balance-Leahy Scale: Fair Standing balance comment: using rolling walker for support in standing                            Communication Communication Communication: No apparent difficulties Factors Affecting Communication: Hearing impaired  Cognition Arousal: Alert Behavior During Therapy: WFL for tasks assessed/performed   PT - Cognitive impairments: No apparent impairments                         Following commands: Intact      Cueing Cueing Techniques: Verbal cues  Exercises      General Comments        Pertinent Vitals/Pain Pain Assessment Pain Assessment: No/denies pain    Home Living  Prior Function            PT Goals (current goals can now be found in the care plan section) Acute Rehab PT Goals Patient Stated Goal: To return home PT Goal Formulation: With patient/family Time For Goal Achievement: 03/18/24 Potential to Achieve Goals: Good Progress towards PT goals: Progressing toward goals    Frequency    Min 2X/week      PT Plan       Co-evaluation              AM-PAC PT "6 Clicks" Mobility   Outcome Measure  Help needed turning from your back to your side while in a flat bed without using bedrails?: A Little Help needed moving from lying on your back to sitting on the side of a flat bed without using bedrails?: A Little Help needed moving to and from a bed to a chair (including a wheelchair)?: A Little Help needed standing up from a chair using your arms (e.g., wheelchair or bedside chair)?: A Little Help needed to walk in hospital room?: A Little Help needed climbing 3-5 steps with a railing? : A Little 6 Click Score: 18    End of Session   Activity Tolerance: Patient tolerated treatment well Patient left: in chair;with call bell/phone within reach;with chair alarm set Nurse Communication: Mobility status PT Visit Diagnosis: Muscle weakness (generalized) (M62.81);Difficulty in walking, not elsewhere classified (R26.2)     Time: 1430-1440 PT Time Calculation (min) (ACUTE ONLY): 10 min  Charges:    $Therapeutic Activity: 8-22 mins PT General Charges $$ ACUTE PT VISIT: 1 Visit                     Donna Bernard, PT, MPT    Ina Homes 03/11/2024, 2:59 PM

## 2024-03-11 NOTE — Progress Notes (Signed)
 PROGRESS NOTE    Daisy Mora  EPP:[id number] DOB: August 20, 1926 DOA: 03/09/2024 PCP: Luciana Axe, NP  [id number]  LOS: 0 days   Brief hospital course:   Assessment & Plan: 89 y.o. female with medical history significant of chronic HFpEF, moderate MR, moderate pulmonary hypertension, refractory HTN, CKD stage IIIb, hypothyroidism chronic kidney disease, syncope.  History from patient as well as daughter at the bedside.  Per report, patient was eating a late breakfast when patient felt extremely weak and fatigued and had a transient episode of syncope at the breakfast table.  Mild generalized confusion.  No chest pain.  No focal hemiparesis.  Noted to have been admitted March 10 of March 14 for acute on chronic HFpEF with aggressive diuresis.  Family's been compliant with discharge regimen though has not taken Lasix in 24 hours.  No abdominal pain or diarrhea.  Patient does report decreased p.o. intake over the past 24 hours.  No reported falls or head trauma.  Syncopal episode/symptoms lasted roughly 2 to 3 minutes. Presented to the ER afebrile, hemodynamically stable.  Satting well on room air.  White count 7.2, hemoglobin 9.7, platelets 199, creatinine 2.96, glucose 134, troponin 24.  EKG normal sinus rhythm.   3/16 : Patient had improvement in creatinine now 2.41.  Baseline around 1.5.  Patient asymptomatic with active no complaint.   Acute kidney injury superimposed on  CKD 3b Presents with creatinine 2.96  with baseline creatinine around 1.5 Likely from diuresis during recent hospitalization, though was not discharged on diuretic. --Cr improved with gentle MIVF --oral hydration now   Syncope noted systolic pressures in the 90s and as well as acute on chronic kidney injury and dehydration.  Clinically dry on exam.  Syncope presumed due to hypotension. --cont tele  Essential hypertension BP elevated this morning --resume home amlodipine, coreg, hydralazine and Imdur    Chronic heart failure with preserved ejection fraction  2D echo March 2025 with a EF of 60 to 65% and grade 2 diastolic dysfunction Appears clinically dry with secondary hypotension --cardio saw pt per daughter's request   GERD (gastroesophageal reflux disease) PPI    DVT prophylaxis: Lovenox SQ Code Status: DNR  Family Communication: daughter updated at bedside today Level of care: Telemetry Medical Dispo:   The patient is from: home Anticipated d/c is to: home Anticipated d/c date is: tomorrow   Subjective and Interval History:  Pt reported feeling better, no dyspnea.  Pt's daughter had called pt's cardio clinic to request pt be seen.   Objective: Vitals:   03/11/24 0233 03/11/24 0730 03/11/24 0731 03/11/24 1510  BP: (!) 178/66 (!) 173/58  (!) 156/56  Pulse: 73 70 69 68  Resp: 17 17    Temp: 99.7 F (37.6 C) 98 F (36.7 C)  97.9 F (36.6 C)  TempSrc: Oral Oral  Oral  SpO2: 95%  94% 96%  Weight:      Height:       No intake or output data in the 24 hours ending 03/11/24 1753 Filed Weights   03/09/24 1207  Weight: 59 kg    Examination:   Constitutional: NAD, AAOx3 HEENT: conjunctivae and lids normal, EOMI CV: No cyanosis.   RESP: normal respiratory effort, on RA Neuro: II - XII grossly intact.   Psych: Normal mood and affect.  Appropriate judgement and reason   Data Reviewed: I have personally reviewed labs and imaging studies  Time spent: 50 minutes  Darlin Priestly, MD [facility] If 7PM-7AM, please  contact night-coverage 03/11/2024, 5:53 PM

## 2024-03-11 NOTE — Plan of Care (Signed)

## 2024-03-11 NOTE — Consult Note (Signed)
 [facility] CLINIC CARDIOLOGY CONSULT NOTE       Patient ID: Daisy Mora MRN: [id number] DOB/AGE: 1926/04/25 89 y.o.  Admit date: 03/09/2024 Referring Physician None - patient's daughter called and asked for her to be seen by our group during admission Primary Physician Luciana Axe, NP  Primary Cardiologist Dr. Gwen Pounds (last seen 2021) Reason for Consultation medication recommendations  HPI: Daisy Mora is a 89 y.o. female  with a past medical history of chronic HFpEF, moderate MR, moderate pulmonary hypertension, refractory HTN, CKD stage IIIb, hypothyroidism who presented to the ED on 03/09/2024 for syncope.  Patient's daughter contacted our office and asked for her to be seen while she was in the hospital.   Patient was discharged home on 3/14 after hospitalization for hypertensive emergency, acute on chronic HFpEF, demand ischemia.  Creatinine had been uptrending.  She was brought back to the ED the following day after syncopal episode at home witnessed by her daughter.  She was found to be hypotensive and was given IV fluids in the ED.  Other notable lab work on admission includes creatinine 2.96, potassium 4.6, BUN 73, hemoglobin 9.7, WBC 7.2.  After fluid resuscitation her BP has trended upwards.  She was restarted on most home BP medications earlier this morning.  At the time of my evaluation she is resting comfortably in hospital bed with daughter present at bedside.  We discussed her syncopal episode in further detail.  She states that she is feeling much better now.  Renal function has been improving.  Denies any dizziness, lightheadedness.  Also denies any chest pain or shortness of breath symptoms at this time.  Appears euvolemic on exam.  Review of systems complete and found to be negative unless listed above    Past Medical History:  Diagnosis Date   Hypertension     Past Surgical History:  Procedure Laterality Date   ABDOMINAL HYSTERECTOMY     APPENDECTOMY      BREAST SURGERY     INTRAMEDULLARY (IM) NAIL INTERTROCHANTERIC Left 12/23/2021   Procedure: INTRAMEDULLARY (IM) NAIL INTERTROCHANTRIC;  Surgeon: Juanell Fairly, MD;  Location: [facility] ORS;  Service: Orthopedics;  Laterality: Left;    Medications Prior to Admission  Medication Sig Dispense Refill Last Dose/Taking   acetaminophen (TYLENOL) 325 MG tablet Take 1-2 tablets (325-650 mg total) by mouth every 6 (six) hours as needed for mild pain (pain score 1-3 or temp > 100.5). 30 tablet 0 Taking As Needed   amLODipine (NORVASC) 5 MG tablet Take 5 mg by mouth daily.   03/09/2024 at  9:00 AM   carvedilol (COREG) 25 MG tablet Take 0.5 tablets (12.5 mg total) by mouth 2 (two) times daily with a meal. 60 tablet 0 03/09/2024 at  9:00 AM   Cholecalciferol 50 MCG (2000 UT) CAPS Take 1 capsule by mouth daily.   03/09/2024 at  9:00 AM   cloNIDine (CATAPRES) 0.1 MG tablet Take 0.1 mg by mouth 2 (two) times daily.   03/09/2024 at  9:00 AM   cyanocobalamin 500 MCG tablet Take 500 mcg by mouth daily.   03/09/2024 at  9:00 AM   hydrALAZINE (APRESOLINE) 50 MG tablet Take 1 tablet (50 mg total) by mouth every 8 (eight) hours. 90 tablet 2 03/09/2024 at  9:00 AM   isosorbide mononitrate (IMDUR) 60 MG 24 hr tablet Take 1 tablet (60 mg total) by mouth daily. 30 tablet 2 03/09/2024 at  9:00 AM   levothyroxine (SYNTHROID) 25 MCG tablet Take 25 mcg  by mouth daily before breakfast.   03/09/2024 at  8:30 AM   Multiple Vitamin (MULTIVITAMIN) capsule Take 1 capsule by mouth daily.   03/09/2024 Morning   omeprazole (PRILOSEC) 20 MG capsule Take 20 mg by mouth daily.   03/09/2024 Morning   polyethylene glycol (MIRALAX / GLYCOLAX) 17 g packet Take 17 g by mouth daily as needed for mild constipation. 14 each 0 Taking As Needed   RESTASIS 0.05 % ophthalmic emulsion Place 1 drop into both eyes 2 (two) times daily.   03/09/2024 Morning   solifenacin (VESICARE) 5 MG tablet Take 5 mg by mouth daily.   03/09/2024 Morning   Social History    Socioeconomic History   Marital status: Widowed    Spouse name: Not on file   Number of children: Not on file   Years of education: Not on file   Highest education level: Not on file  Occupational History   Not on file  Tobacco Use   Smoking status: Never   Smokeless tobacco: Never  Vaping Use   Vaping status: Never Used  Substance and Sexual Activity   Alcohol use: No   Drug use: No   Sexual activity: Not Currently  Other Topics Concern   Not on file  Social History Narrative   Not on file   Social Drivers of Health   Financial Resource Strain: Low Risk  (06/12/2019)   Received from [facility]   Overall Financial Resource Strain (CARDIA)    Difficulty of Paying Living Expenses: Not hard at all  Food Insecurity: No Food Insecurity (03/09/2024)   Hunger Vital Sign    Worried About Running Out of Food in the Last Year: Never true    Ran Out of Food in the Last Year: Never true  Transportation Needs: No Transportation Needs (03/09/2024)   PRAPARE - Administrator, Civil Service (Medical): No    Lack of Transportation (Non-Medical): No  Physical Activity: Sufficiently Active (06/12/2019)   Received from [facility]   Exercise Vital Sign    Days of Exercise per Week: 6 days    Minutes of Exercise per Session: 30 min  Stress: Not on file  Social Connections: Socially Isolated (03/09/2024)   Social Connection and Isolation Panel [NHANES]    Frequency of Communication with Friends and Family: Never    Frequency of Social Gatherings with Friends and Family: Never    Attends Religious Services: Never    Database administrator or Organizations: No    Attends Banker Meetings: Never    Marital Status: Widowed  Intimate Partner Violence: Not At Risk (03/09/2024)   Humiliation, Afraid, Rape, and Kick questionnaire    Fear of Current or Ex-Partner: No    Emotionally Abused: No    Physically Abused: No    Sexually  Abused: No    History reviewed. No pertinent family history.   Vitals:   03/10/24 1928 03/11/24 0233 03/11/24 0730 03/11/24 0731  BP: (!) 170/61 (!) 178/66 (!) 173/58   Pulse: 74 73 70 69  Resp: 19 17 17    Temp: 100.2 F (37.9 C) 99.7 F (37.6 C) 98 F (36.7 C)   TempSrc: Oral Oral Oral   SpO2: 95% 95%  94%  Weight:      Height:        PHYSICAL EXAM General: Chronically ill-appearing elderly female, well nourished, in no acute distress. HEENT: Normocephalic and atraumatic. Neck: No JVD.  Lungs: Normal  respiratory effort on room air. Clear bilaterally to auscultation. No wheezes, crackles, rhonchi.  Heart: HRRR. Normal S1 and S2 without gallops or murmurs.  Abdomen: Non-distended appearing.  Msk: Normal strength and tone for age. Extremities: Warm and well perfused. No clubbing, cyanosis.  No edema.  Neuro: Alert and oriented X 3. Psych: Answers questions appropriately.   Labs: Basic Metabolic Panel: Recent Labs    03/10/24 0520 03/11/24 0558  NA 135 138  K 4.4 4.6  CL 109 111  CO2 19* 19*  GLUCOSE 96 85  BUN 74* 70*  CREATININE 2.41* 1.98*  CALCIUM 8.1* 8.4*   Liver Function Tests: Recent Labs    03/09/24 1227 03/10/24 0520  AST 16 13*  ALT 12 12  ALKPHOS 47 43  BILITOT 0.7 0.9  PROT 6.3* 5.7*  ALBUMIN 3.4* 3.0*   No results for input(s): "LIPASE", "AMYLASE" in the last 72 hours. CBC: Recent Labs    03/09/24 1227 03/10/24 0520  WBC 7.2 6.1  HGB 9.7* 8.6*  HCT 30.5* 26.2*  MCV 95.3 92.6  PLT 199 183   Cardiac Enzymes: Recent Labs    03/09/24 1227  TROPONINIHS 24*   BNP: No results for input(s): "BNP" in the last 72 hours. D-Dimer: No results for input(s): "DDIMER" in the last 72 hours. Hemoglobin A1C: No results for input(s): "HGBA1C" in the last 72 hours. Fasting Lipid Panel: No results for input(s): "CHOL", "HDL", "LDLCALC", "TRIG", "CHOLHDL", "LDLDIRECT" in the last 72 hours. Thyroid Function Tests: No results for input(s): "TSH",  "T4TOTAL", "T3FREE", "THYROIDAB" in the last 72 hours.  Invalid input(s): "FREET3" Anemia Panel: No results for input(s): "VITAMINB12", "FOLATE", "FERRITIN", "TIBC", "IRON", "RETICCTPCT" in the last 72 hours.   Radiology: ECHOCARDIOGRAM COMPLETE Result Date: 03/06/2024    ECHOCARDIOGRAM REPORT   Patient Name:   Daisy Mora Date of Exam: 03/05/2024 Medical Rec #:  [id number]        Height:       63.0 in Accession #:    [id number]       Weight:       140.0 lb Date of Birth:  [birth year]         BSA:          1.662 m Patient Age:    89 years         BP:           168/65 mmHg Patient Gender: F                HR:           93 bpm. Exam Location:  [facility] Procedure: 2D Echo, Cardiac Doppler and Color Doppler (Both Spectral and Color            Flow Doppler were utilized during procedure). Indications:     CHF- Acute Diastolic I50.31  History:         Patient has no prior history of Echocardiogram examinations.                  CHF, CKD, stage 3; Risk Factors:Hypertension.  Sonographer:     Daisy Herrlich RCS Referring Phys:  [id number] Graylin Sperling Diagnosing Phys: Rozell Searing Custovic IMPRESSIONS  1. Left ventricular ejection fraction, by estimation, is 60 to 65%. The left ventricle has normal function. The left ventricle has no regional wall motion abnormalities. Left ventricular diastolic parameters are consistent with Grade II diastolic dysfunction (pseudonormalization).  2. Right ventricular systolic function is normal. The right ventricular size is normal.  3. Left atrial size was severely dilated.  4. The mitral valve is normal in structure. Mild mitral valve regurgitation. No evidence of mitral stenosis.  5. The aortic valve is normal in structure. Aortic valve regurgitation is not visualized. No aortic stenosis is present.  6. The inferior vena cava is normal in size with greater than 50% respiratory variability, suggesting right atrial pressure of 3 mmHg. FINDINGS  Left Ventricle: Left ventricular ejection  fraction, by estimation, is 60 to 65%. The left ventricle has normal function. The left ventricle has no regional wall motion abnormalities. The left ventricular internal cavity size was normal in size. There is  no left ventricular hypertrophy. Left ventricular diastolic parameters are consistent with Grade II diastolic dysfunction (pseudonormalization). Right Ventricle: The right ventricular size is normal. No increase in right ventricular wall thickness. Right ventricular systolic function is normal. Left Atrium: Left atrial size was severely dilated. Right Atrium: Right atrial size was normal in size. Pericardium: There is no evidence of pericardial effusion. Mitral Valve: The mitral valve is normal in structure. Mild mitral valve regurgitation. No evidence of mitral valve stenosis. MV peak gradient, 9.5 mmHg. The mean mitral valve gradient is 4.0 mmHg. Tricuspid Valve: The tricuspid valve is normal in structure. Tricuspid valve regurgitation is trivial. Aortic Valve: The aortic valve is normal in structure. Aortic valve regurgitation is not visualized. No aortic stenosis is present. Aortic valve peak gradient measures 11.2 mmHg. Pulmonic Valve: The pulmonic valve was normal in structure. Pulmonic valve regurgitation is not visualized. No evidence of pulmonic stenosis. Aorta: The aortic root is normal in size and structure. Venous: The inferior vena cava is normal in size with greater than 50% respiratory variability, suggesting right atrial pressure of 3 mmHg. IAS/Shunts: No atrial level shunt detected by color flow Doppler.  LEFT VENTRICLE PLAX 2D LVIDd:         3.12 cm   Diastology LVIDs:         1.94 cm   LV e' medial:    3.58 cm/s LV PW:         1.20 cm   LV E/e' medial:  22.5 LV IVS:        1.03 cm   LV e' lateral:   3.29 cm/s LVOT diam:     2.00 cm   LV E/e' lateral: 24.5 LV SV:         79 LV SV Index:   47 LVOT Area:     3.14 cm  RIGHT VENTRICLE             IVC RV S prime:     19.50 cm/s  IVC diam: 2.17  cm TAPSE (M-mode): 2.9 cm LEFT ATRIUM             Index        RIGHT ATRIUM           Index LA diam:        5.40 cm 3.25 cm/m   RA Area:     15.50 cm LA Vol (A2C):   76.1 ml 45.77 ml/m  RA Volume:   39.20 ml  23.59 ml/m LA Vol (A4C):   91.9 ml 55.28 ml/m LA Biplane Vol: 91.0 ml 54.76 ml/m  AORTIC VALVE AV Area (Vmax): 2.52 cm AV Vmax:        167.00 cm/s AV Peak Grad:   11.2 mmHg LVOT Vmax:      134.00 cm/s LVOT Vmean:     90.400 cm/s LVOT VTI:  0.250 m  AORTA Ao Root diam: 3.10 cm Ao Asc diam:  3.70 cm MITRAL VALVE                TRICUSPID VALVE MV Area (PHT): 4.10 cm     TR Peak grad:   8.3 mmHg MV Area VTI:   2.12 cm     TR Vmax:        144.00 cm/s MV Peak grad:  9.5 mmHg MV Mean grad:  4.0 mmHg     SHUNTS MV Vmax:       1.54 m/s     Systemic VTI:  0.25 m MV Vmean:      88.4 cm/s    Systemic Diam: 2.00 cm MV Decel Time: 185 msec MV E velocity: 80.50 cm/s MV A velocity: 170.00 cm/s MV E/A ratio:  0.47 Sabina Custovic Electronically signed by Clotilde Dieter Signature Date/Time: 03/06/2024/1:37:23 PM    Final    DG Chest 1 View Result Date: 03/05/2024 CLINICAL DATA:  Congestive heart failure. EXAM: CHEST  1 VIEW COMPARISON:  03/04/2024 FINDINGS: Stable cardiac enlargement. Stable small bilateral pleural effusions. Degree of pulmonary interstitial edema may be mildly improved. No focal airspace consolidation or pneumothorax. IMPRESSION: Mild improvement in pulmonary interstitial edema. Stable small bilateral pleural effusions. Stable cardiac enlargement. Electronically Signed   By: Irish Lack M.D.   On: 03/05/2024 11:10   DG Chest Port 1 View Result Date: 03/04/2024 CLINICAL DATA:  [id number] with shortness of breath onset yesterday. EXAM: PORTABLE CHEST 1 VIEW COMPARISON:  Portable chest 12/22/2021 FINDINGS: The patient is rotated to the right exaggerating the mediastinum. The aorta is tortuous and calcified. The heart is moderately enlarged. The mitral ring is heavily calcified. There is  perihilar vascular congestion, mild generalized interstitial consolidation consistent with edema and small pleural effusions. Findings consistent with CHF or fluid overload. Scattered bilateral perihilar opacities are also noted, could be due to ground-glass edema or pneumonia. No other focal infiltrate is seen. There is slight thickening in the right horizontal fissure most likely due to fluid in the fissure. Additional right perihilar linear scar-like opacity. There is levoscoliosis and degenerative change of the spine with osteopenia. No new osseous findings. IMPRESSION: 1. Cardiomegaly with perihilar vascular congestion, mild generalized interstitial consolidation consistent with edema and small pleural effusions. Findings consistent with CHF or fluid overload. 2. Scattered bilateral perihilar opacities could be due to ground-glass edema or pneumonia. 3. Aortic atherosclerosis. 4. Osteopenia and degenerative change. Electronically Signed   By: Almira Bar M.D.   On: 03/04/2024 04:26    ECHO as above  TELEMETRY reviewed by me 03/11/2024: Sinus rhythm rate 60s  EKG reviewed by me: Sinus bradycardia rate 59 bpm  Data reviewed by me 03/11/2024: last 24h vitals tele labs imaging I/O ED provider note, admission H&P  Principal Problem:   Acute kidney injury superimposed on chronic kidney disease (HCC) Active Problems:   Essential hypertension   GERD (gastroesophageal reflux disease)   Syncope   (HFpEF) heart failure with preserved ejection fraction (HCC)    ASSESSMENT AND PLAN:  Daisy Mora is a 89 y.o. female  with a past medical history of chronic HFpEF, moderate MR, moderate pulmonary hypertension, refractory HTN, CKD stage IIIb, hypothyroidism who presented to the ED on 03/09/2024 for syncope.  Patient's daughter contacted our office and asked for her to be seen while she was in the hospital.   # Syncope # Hypotension # AKI on CKD # Chronic HFpEF # Refractory hypertension Patient  with history of chronic HFpEF and refractory hypertension on multiple medications presented to the ED after syncopal episode witnessed by her daughter.  Found to be hypotensive and there was concern for dehydration as creatinine was elevated to 2.9.  She was given IV fluids and has been doing better, now BP is elevated.  Appears euvolemic on exam. -Agree with resuming home hydralazine 50 mg 3 times daily, Imdur 60 mg daily, amlodipine 10 mg daily, carvedilol 12.5 mg twice daily. -Continue to hold lisinopril and Lasix given AKI.  Can consider resuming these medications outpatient. -Plan to reassess BP tomorrow and make any adjustments to medications as needed.    This patient's plan of care was discussed and created with Dr. Corky Sing and he is in agreement.  Signed: Gale Journey, PA-C  03/11/2024, 1:13 PM [facility] Cardiology

## 2024-03-12 DIAGNOSIS — I5030 Unspecified diastolic (congestive) heart failure: Secondary | ICD-10-CM | POA: Diagnosis not present

## 2024-03-12 DIAGNOSIS — I10 Essential (primary) hypertension: Secondary | ICD-10-CM | POA: Diagnosis not present

## 2024-03-12 DIAGNOSIS — R55 Syncope and collapse: Secondary | ICD-10-CM | POA: Diagnosis not present

## 2024-03-12 DIAGNOSIS — I959 Hypotension, unspecified: Secondary | ICD-10-CM | POA: Diagnosis not present

## 2024-03-12 DIAGNOSIS — N189 Chronic kidney disease, unspecified: Secondary | ICD-10-CM | POA: Diagnosis not present

## 2024-03-12 DIAGNOSIS — N179 Acute kidney failure, unspecified: Secondary | ICD-10-CM | POA: Diagnosis not present

## 2024-03-12 LAB — CBC
HCT: 27.7 % — ABNORMAL LOW (ref 36.0–46.0)
Hemoglobin: 9.1 g/dL — ABNORMAL LOW (ref 12.0–15.0)
MCH: 30.5 pg (ref 26.0–34.0)
MCHC: 32.9 g/dL (ref 30.0–36.0)
MCV: 93 fL (ref 80.0–100.0)
Platelets: 216 10*3/uL (ref 150–400)
RBC: 2.98 MIL/uL — ABNORMAL LOW (ref 3.87–5.11)
RDW: 13.1 % (ref 11.5–15.5)
WBC: 6.2 10*3/uL (ref 4.0–10.5)
nRBC: 0 % (ref 0.0–0.2)

## 2024-03-12 LAB — BASIC METABOLIC PANEL
Anion gap: 6 (ref 5–15)
BUN: 76 mg/dL — ABNORMAL HIGH (ref 8–23)
CO2: 19 mmol/L — ABNORMAL LOW (ref 22–32)
Calcium: 8.8 mg/dL — ABNORMAL LOW (ref 8.9–10.3)
Chloride: 110 mmol/L (ref 98–111)
Creatinine, Ser: 2.12 mg/dL — ABNORMAL HIGH (ref 0.44–1.00)
GFR, Estimated: 21 mL/min — ABNORMAL LOW (ref >60)
Glucose, Bld: 88 mg/dL (ref 70–99)
Potassium: 4.7 mmol/L (ref 3.5–5.1)
Sodium: 135 mmol/L (ref 135–145)

## 2024-03-12 LAB — MAGNESIUM: Magnesium: 2.3 mg/dL (ref 1.7–2.4)

## 2024-03-12 MED ORDER — AMLODIPINE BESYLATE 10 MG PO TABS: 10.0000 mg | ORAL_TABLET | Freq: Every day | ORAL | 2 refills | Status: AC

## 2024-03-12 MED ORDER — HYDRALAZINE HCL 50 MG PO TABS
75.0000 mg | ORAL_TABLET | Freq: Three times a day (TID) | ORAL | 2 refills | Status: AC
Start: 2024-03-12 — End: ?

## 2024-03-12 MED ORDER — HYDRALAZINE HCL 50 MG PO TABS: 75.0000 mg | ORAL_TABLET | Freq: Three times a day (TID) | ORAL | Status: DC

## 2024-03-12 MED ORDER — SODIUM CHLORIDE 0.9 % IV SOLN: INTRAVENOUS | Status: AC

## 2024-03-12 NOTE — Progress Notes (Signed)
 [facility] CLINIC CARDIOLOGY PROGRESS NOTE       Patient ID: Daisy Mora MRN: [id number] DOB/AGE: 08-05-1926 89 y.o.  Admit date: 03/09/2024 Referring Physician None - patient's daughter called and asked for her to be seen by our group during admission Primary Physician Luciana Axe, NP  Primary Cardiologist Dr. Gwen Pounds (last seen 2021) Reason for Consultation medication recommendations  HPI: Daisy Mora is a 89 y.o. female  with a past medical history of chronic HFpEF, moderate MR, moderate pulmonary hypertension, refractory HTN, CKD stage IIIb, hypothyroidism who presented to the ED on 03/09/2024 for syncope.  Patient's daughter contacted our office and asked for her to be seen while she was in the hospital.   Interval history: -Patient seen and examined this morning, resting comfortably in hospital bed. -She denies any chest pain or shortness of breath symptoms. -BP overall is improved but still slightly elevated. -Creatinine slightly uptrending today.  Review of systems complete and found to be negative unless listed above    Past Medical History:  Diagnosis Date   Hypertension     Past Surgical History:  Procedure Laterality Date   ABDOMINAL HYSTERECTOMY     APPENDECTOMY     BREAST SURGERY     INTRAMEDULLARY (IM) NAIL INTERTROCHANTERIC Left 12/23/2021   Procedure: INTRAMEDULLARY (IM) NAIL INTERTROCHANTRIC;  Surgeon: Juanell Fairly, MD;  Location: [facility] ORS;  Service: Orthopedics;  Laterality: Left;    Medications Prior to Admission  Medication Sig Dispense Refill Last Dose/Taking   acetaminophen (TYLENOL) 325 MG tablet Take 1-2 tablets (325-650 mg total) by mouth every 6 (six) hours as needed for mild pain (pain score 1-3 or temp > 100.5). 30 tablet 0 Taking As Needed   carvedilol (COREG) 25 MG tablet Take 0.5 tablets (12.5 mg total) by mouth 2 (two) times daily with a meal. 60 tablet 0 03/09/2024 at  9:00 AM   Cholecalciferol 50 MCG (2000 UT) CAPS Take 1  capsule by mouth daily.   03/09/2024 at  9:00 AM   cloNIDine (CATAPRES) 0.1 MG tablet Take 0.1 mg by mouth 2 (two) times daily.   03/09/2024 at  9:00 AM   cyanocobalamin 500 MCG tablet Take 500 mcg by mouth daily.   03/09/2024 at  9:00 AM   isosorbide mononitrate (IMDUR) 60 MG 24 hr tablet Take 1 tablet (60 mg total) by mouth daily. 30 tablet 2 03/09/2024 at  9:00 AM   levothyroxine (SYNTHROID) 25 MCG tablet Take 25 mcg by mouth daily before breakfast.   03/09/2024 at  8:30 AM   Multiple Vitamin (MULTIVITAMIN) capsule Take 1 capsule by mouth daily.   03/09/2024 Morning   omeprazole (PRILOSEC) 20 MG capsule Take 20 mg by mouth daily.   03/09/2024 Morning   polyethylene glycol (MIRALAX / GLYCOLAX) 17 g packet Take 17 g by mouth daily as needed for mild constipation. 14 each 0 Taking As Needed   RESTASIS 0.05 % ophthalmic emulsion Place 1 drop into both eyes 2 (two) times daily.   03/09/2024 Morning   solifenacin (VESICARE) 5 MG tablet Take 5 mg by mouth daily.   03/09/2024 Morning   [DISCONTINUED] amLODipine (NORVASC) 5 MG tablet Take 5 mg by mouth daily.   03/09/2024 at  9:00 AM   [DISCONTINUED] hydrALAZINE (APRESOLINE) 50 MG tablet Take 1 tablet (50 mg total) by mouth every 8 (eight) hours. 90 tablet 2 03/09/2024 at  9:00 AM   Social History   Socioeconomic History   Marital status: Widowed    Spouse  name: Not on file   Number of children: Not on file   Years of education: Not on file   Highest education level: Not on file  Occupational History   Not on file  Tobacco Use   Smoking status: Never   Smokeless tobacco: Never  Vaping Use   Vaping status: Never Used  Substance and Sexual Activity   Alcohol use: No   Drug use: No   Sexual activity: Not Currently  Other Topics Concern   Not on file  Social History Narrative   Not on file   Social Drivers of Health   Financial Resource Strain: Low Risk  (06/12/2019)   Received from [facility]   Overall Financial Resource Strain  (CARDIA)    Difficulty of Paying Living Expenses: Not hard at all  Food Insecurity: No Food Insecurity (03/09/2024)   Hunger Vital Sign    Worried About Running Out of Food in the Last Year: Never true    Ran Out of Food in the Last Year: Never true  Transportation Needs: No Transportation Needs (03/09/2024)   PRAPARE - Administrator, Civil Service (Medical): No    Lack of Transportation (Non-Medical): No  Physical Activity: Sufficiently Active (06/12/2019)   Received from [facility]   Exercise Vital Sign    Days of Exercise per Week: 6 days    Minutes of Exercise per Session: 30 min  Stress: Not on file  Social Connections: Socially Isolated (03/09/2024)   Social Connection and Isolation Panel [NHANES]    Frequency of Communication with Friends and Family: Never    Frequency of Social Gatherings with Friends and Family: Never    Attends Religious Services: Never    Database administrator or Organizations: No    Attends Banker Meetings: Never    Marital Status: Widowed  Intimate Partner Violence: Not At Risk (03/09/2024)   Humiliation, Afraid, Rape, and Kick questionnaire    Fear of Current or Ex-Partner: No    Emotionally Abused: No    Physically Abused: No    Sexually Abused: No    History reviewed. No pertinent family history.   Vitals:   03/11/24 1510 03/11/24 2005 03/12/24 0342 03/12/24 0922  BP: (!) 156/56 (!) 143/51 (!) 158/52 (!) 169/52  Pulse: 68 71 73 71  Resp:  20 18 (!) 22  Temp: 97.9 F (36.6 C) 98.4 F (36.9 C) 98.4 F (36.9 C) 97.8 F (36.6 C)  TempSrc: Oral Oral Oral Oral  SpO2: 96% 95% 94% 96%  Weight:      Height:        PHYSICAL EXAM General: Chronically ill-appearing elderly female, well nourished, in no acute distress. HEENT: Normocephalic and atraumatic. Neck: No JVD.  Lungs: Normal respiratory effort on room air. Clear bilaterally to auscultation. No wheezes, crackles, rhonchi.  Heart: HRRR. Normal S1  and S2 without gallops or murmurs.  Abdomen: Non-distended appearing.  Msk: Normal strength and tone for age. Extremities: Warm and well perfused. No clubbing, cyanosis.  No edema.  Neuro: Alert and oriented X 3. Psych: Answers questions appropriately.   Labs: Basic Metabolic Panel: Recent Labs    03/11/24 0558 03/12/24 0611  NA 138 135  K 4.6 4.7  CL 111 110  CO2 19* 19*  GLUCOSE 85 88  BUN 70* 76*  CREATININE 1.98* 2.12*  CALCIUM 8.4* 8.8*  MG  --  2.3   Liver Function Tests: Recent Labs    03/09/24  1227 03/10/24 0520  AST 16 13*  ALT 12 12  ALKPHOS 47 43  BILITOT 0.7 0.9  PROT 6.3* 5.7*  ALBUMIN 3.4* 3.0*   No results for input(s): "LIPASE", "AMYLASE" in the last 72 hours. CBC: Recent Labs    03/10/24 0520 03/12/24 0611  WBC 6.1 6.2  HGB 8.6* 9.1*  HCT 26.2* 27.7*  MCV 92.6 93.0  PLT 183 216   Cardiac Enzymes: Recent Labs    03/09/24 1227  TROPONINIHS 24*   BNP: No results for input(s): "BNP" in the last 72 hours. D-Dimer: No results for input(s): "DDIMER" in the last 72 hours. Hemoglobin A1C: No results for input(s): "HGBA1C" in the last 72 hours. Fasting Lipid Panel: No results for input(s): "CHOL", "HDL", "LDLCALC", "TRIG", "CHOLHDL", "LDLDIRECT" in the last 72 hours. Thyroid Function Tests: No results for input(s): "TSH", "T4TOTAL", "T3FREE", "THYROIDAB" in the last 72 hours.  Invalid input(s): "FREET3" Anemia Panel: No results for input(s): "VITAMINB12", "FOLATE", "FERRITIN", "TIBC", "IRON", "RETICCTPCT" in the last 72 hours.   Radiology: ECHOCARDIOGRAM COMPLETE Result Date: 03/06/2024    ECHOCARDIOGRAM REPORT   Patient Name:   HATTIE PINE Date of Exam: 03/05/2024 Medical Rec #:  [id number]        Height:       63.0 in Accession #:    [id number]       Weight:       140.0 lb Date of Birth:  [birth year]         BSA:          1.662 m Patient Age:    89 years         BP:           168/65 mmHg Patient Gender: F                HR:           93  bpm. Exam Location:  [facility] Procedure: 2D Echo, Cardiac Doppler and Color Doppler (Both Spectral and Color            Flow Doppler were utilized during procedure). Indications:     CHF- Acute Diastolic I50.31  History:         Patient has no prior history of Echocardiogram examinations.                  CHF, CKD, stage 3; Risk Factors:Hypertension.  Sonographer:     Lucendia Herrlich RCS Referring Phys:  [id number] Domitila Stetler Diagnosing Phys: Rozell Searing Custovic IMPRESSIONS  1. Left ventricular ejection fraction, by estimation, is 60 to 65%. The left ventricle has normal function. The left ventricle has no regional wall motion abnormalities. Left ventricular diastolic parameters are consistent with Grade II diastolic dysfunction (pseudonormalization).  2. Right ventricular systolic function is normal. The right ventricular size is normal.  3. Left atrial size was severely dilated.  4. The mitral valve is normal in structure. Mild mitral valve regurgitation. No evidence of mitral stenosis.  5. The aortic valve is normal in structure. Aortic valve regurgitation is not visualized. No aortic stenosis is present.  6. The inferior vena cava is normal in size with greater than 50% respiratory variability, suggesting right atrial pressure of 3 mmHg. FINDINGS  Left Ventricle: Left ventricular ejection fraction, by estimation, is 60 to 65%. The left ventricle has normal function. The left ventricle has no regional wall motion abnormalities. The left ventricular internal cavity size was normal in size. There is  no left ventricular hypertrophy. Left  ventricular diastolic parameters are consistent with Grade II diastolic dysfunction (pseudonormalization). Right Ventricle: The right ventricular size is normal. No increase in right ventricular wall thickness. Right ventricular systolic function is normal. Left Atrium: Left atrial size was severely dilated. Right Atrium: Right atrial size was normal in size. Pericardium: There is no  evidence of pericardial effusion. Mitral Valve: The mitral valve is normal in structure. Mild mitral valve regurgitation. No evidence of mitral valve stenosis. MV peak gradient, 9.5 mmHg. The mean mitral valve gradient is 4.0 mmHg. Tricuspid Valve: The tricuspid valve is normal in structure. Tricuspid valve regurgitation is trivial. Aortic Valve: The aortic valve is normal in structure. Aortic valve regurgitation is not visualized. No aortic stenosis is present. Aortic valve peak gradient measures 11.2 mmHg. Pulmonic Valve: The pulmonic valve was normal in structure. Pulmonic valve regurgitation is not visualized. No evidence of pulmonic stenosis. Aorta: The aortic root is normal in size and structure. Venous: The inferior vena cava is normal in size with greater than 50% respiratory variability, suggesting right atrial pressure of 3 mmHg. IAS/Shunts: No atrial level shunt detected by color flow Doppler.  LEFT VENTRICLE PLAX 2D LVIDd:         3.12 cm   Diastology LVIDs:         1.94 cm   LV e' medial:    3.58 cm/s LV PW:         1.20 cm   LV E/e' medial:  22.5 LV IVS:        1.03 cm   LV e' lateral:   3.29 cm/s LVOT diam:     2.00 cm   LV E/e' lateral: 24.5 LV SV:         79 LV SV Index:   47 LVOT Area:     3.14 cm  RIGHT VENTRICLE             IVC RV S prime:     19.50 cm/s  IVC diam: 2.17 cm TAPSE (M-mode): 2.9 cm LEFT ATRIUM             Index        RIGHT ATRIUM           Index LA diam:        5.40 cm 3.25 cm/m   RA Area:     15.50 cm LA Vol (A2C):   76.1 ml 45.77 ml/m  RA Volume:   39.20 ml  23.59 ml/m LA Vol (A4C):   91.9 ml 55.28 ml/m LA Biplane Vol: 91.0 ml 54.76 ml/m  AORTIC VALVE AV Area (Vmax): 2.52 cm AV Vmax:        167.00 cm/s AV Peak Grad:   11.2 mmHg LVOT Vmax:      134.00 cm/s LVOT Vmean:     90.400 cm/s LVOT VTI:       0.250 m  AORTA Ao Root diam: 3.10 cm Ao Asc diam:  3.70 cm MITRAL VALVE                TRICUSPID VALVE MV Area (PHT): 4.10 cm     TR Peak grad:   8.3 mmHg MV Area VTI:   2.12  cm     TR Vmax:        144.00 cm/s MV Peak grad:  9.5 mmHg MV Mean grad:  4.0 mmHg     SHUNTS MV Vmax:       1.54 m/s     Systemic VTI:  0.25 m MV Vmean:  88.4 cm/s    Systemic Diam: 2.00 cm MV Decel Time: 185 msec MV E velocity: 80.50 cm/s MV A velocity: 170.00 cm/s MV E/A ratio:  0.47 Sabina Custovic Electronically signed by Clotilde Dieter Signature Date/Time: 03/06/2024/1:37:23 PM    Final    DG Chest 1 View Result Date: 03/05/2024 CLINICAL DATA:  Congestive heart failure. EXAM: CHEST  1 VIEW COMPARISON:  03/04/2024 FINDINGS: Stable cardiac enlargement. Stable small bilateral pleural effusions. Degree of pulmonary interstitial edema may be mildly improved. No focal airspace consolidation or pneumothorax. IMPRESSION: Mild improvement in pulmonary interstitial edema. Stable small bilateral pleural effusions. Stable cardiac enlargement. Electronically Signed   By: Irish Lack M.D.   On: 03/05/2024 11:10   DG Chest Port 1 View Result Date: 03/04/2024 CLINICAL DATA:  [id number] with shortness of breath onset yesterday. EXAM: PORTABLE CHEST 1 VIEW COMPARISON:  Portable chest 12/22/2021 FINDINGS: The patient is rotated to the right exaggerating the mediastinum. The aorta is tortuous and calcified. The heart is moderately enlarged. The mitral ring is heavily calcified. There is perihilar vascular congestion, mild generalized interstitial consolidation consistent with edema and small pleural effusions. Findings consistent with CHF or fluid overload. Scattered bilateral perihilar opacities are also noted, could be due to ground-glass edema or pneumonia. No other focal infiltrate is seen. There is slight thickening in the right horizontal fissure most likely due to fluid in the fissure. Additional right perihilar linear scar-like opacity. There is levoscoliosis and degenerative change of the spine with osteopenia. No new osseous findings. IMPRESSION: 1. Cardiomegaly with perihilar vascular congestion, mild  generalized interstitial consolidation consistent with edema and small pleural effusions. Findings consistent with CHF or fluid overload. 2. Scattered bilateral perihilar opacities could be due to ground-glass edema or pneumonia. 3. Aortic atherosclerosis. 4. Osteopenia and degenerative change. Electronically Signed   By: Almira Bar M.D.   On: 03/04/2024 04:26    ECHO as above  TELEMETRY reviewed by me 03/12/2024: Sinus rhythm rate 60-70s  EKG reviewed by me: Sinus bradycardia rate 59 bpm  Data reviewed by me 03/12/2024: last 24h vitals tele labs imaging I/O hospitalist progress note  Principal Problem:   Acute kidney injury superimposed on chronic kidney disease (HCC) Active Problems:   Essential hypertension   GERD (gastroesophageal reflux disease)   Syncope   (HFpEF) heart failure with preserved ejection fraction (HCC)    ASSESSMENT AND PLAN:  TONEE SILVERSTEIN is a 89 y.o. female  with a past medical history of chronic HFpEF, moderate MR, moderate pulmonary hypertension, refractory HTN, CKD stage IIIb, hypothyroidism who presented to the ED on 03/09/2024 for syncope.  Patient's daughter contacted our office and asked for her to be seen while she was in the hospital.   # Syncope # Hypotension # AKI on CKD # Chronic HFpEF # Refractory hypertension Patient with history of chronic HFpEF and refractory hypertension on multiple medications presented to the ED after syncopal episode witnessed by her daughter.  Found to be hypotensive and there was concern for dehydration as creatinine was elevated to 2.9.  She was given IV fluids and has been doing better, now BP is elevated.  Appears euvolemic on exam. -Increase hydralazine to 75 mg 3 times daily.  Continue Imdur 60 mg daily, amlodipine 10 mg daily, carvedilol 12.5 mg twice daily. -Continue to hold lisinopril and Lasix given AKI.  Can consider resuming these medications outpatient.  Overall stable for discharge today from cardiac  perspective.  Will plan for follow-up in our clinic in 1  to 2 weeks.  This patient's plan of care was discussed and created with Dr. Corky Sing and he is in agreement.  Signed: Gale Journey, PA-C  03/12/2024, 9:48 AM [facility]

## 2024-03-12 NOTE — Discharge Summary (Addendum)
 Physician Discharge Summary   Daisy Mora  female DOB: [birth year]  ONG:[id number]  PCP: Luciana Axe, NP  Admit date: 03/09/2024 Discharge date: 03/12/2024  Admitted From: home Disposition:  home Family updated at bedside prior to discharge. CODE STATUS: DNR  Discharge Instructions     Diet - low sodium heart healthy   Complete by: As directed       Hospital Course:  For full details, please see H&P, progress notes, consult notes and ancillary notes.  Briefly,  Daisy Mora is a 89 y.o. female with medical history significant of chronic HFpEF, moderate MR, moderate pulmonary hypertension, refractory HTN, CKD stage IIIb, hypothyroidism chronic kidney disease, syncope who presented the day after discharge due to a syncope event.  Per report, patient was eating a late breakfast when patient felt extremely weak and fatigued and had a transient episode of syncope at the breakfast table.  Noted to have been admitted March 10 of March 14 for acute on chronic HFpEF with aggressive diuresis.    Patient did report decreased p.o. intake over the past 24 hours.  No reported falls or head trauma.  Syncopal episode/symptoms lasted roughly 2 to 3 minutes.  Syncope noted systolic pressures in the 90s and as well as acute on chronic kidney injury and dehydration.  Clinically dry on exam.  Syncope presumed due to hypotension.  Acute kidney injury superimposed on  CKD 3b Presents with creatinine 2.96 with baseline creatinine around 1.5.  Cr was already trending up during last hospitalization and was 2.23 prior to last discharge (the day before current admission).  AKI likely from diuresis during last hospitalization. --Cr improved with gentle MIVF, Cr 2.12 prior to current discharge. --hold diuretic   Essential hypertension BP elevated morning of 3/17 --Increased home amlodipine 5 mg to 10 mg daily, increased hydralazine from 50 mg to 75 mg TID. --cont home coreg and  Imdur --D/c home clonidine   Chronic heart failure with preserved ejection fraction  2D echo March 2025 with a EF of 60 to 65% and grade 2 diastolic dysfunction Appears clinically dry with secondary hypotension --cardio saw pt per daughter's request.  Pt was discharged without diuretics and will f/u with cardio as outpatient.   GERD (gastroesophageal reflux disease) PPI    Discharge Diagnoses:  Principal Problem:   Acute kidney injury superimposed on chronic kidney disease (HCC) Active Problems:   Syncope   Essential hypertension   GERD (gastroesophageal reflux disease)   (HFpEF) heart failure with preserved ejection fraction (HCC)   30 Day Unplanned Readmission Risk Score    Flowsheet Row ED to Hosp-Admission (Discharged) from 03/04/2024 in [facility] PCU  30 Day Unplanned Readmission Risk Score (%) 16.22 Filed at 03/08/2024 1200       This score is the patient's risk of an unplanned readmission within 30 days of being discharged (0 -100%). The score is based on dignosis, age, lab data, medications, orders, and past utilization.   Low:  0-14.9   Medium: 15-21.9   High: 22-29.9   Extreme: 30 and above         Discharge Instructions:  Allergies as of 03/12/2024       Reactions   Aspirin Tinitus   Losartan Potassium-hctz Other (See Comments)        Medication List     STOP taking these medications    cloNIDine 0.1 MG tablet Commonly known as: CATAPRES       TAKE these medications  acetaminophen 325 MG tablet Commonly known as: TYLENOL Take 1-2 tablets (325-650 mg total) by mouth every 6 (six) hours as needed for mild pain (pain score 1-3 or temp > 100.5).   amLODipine 10 MG tablet Commonly known as: NORVASC Take 1 tablet (10 mg total) by mouth daily. Increased from 5 mg. What changed:  medication strength how much to take additional instructions   carvedilol 25 MG tablet Commonly known as: COREG Take 0.5 tablets (12.5 mg  total) by mouth 2 (two) times daily with a meal.   Cholecalciferol 50 MCG (2000 UT) Caps Take 1 capsule by mouth daily.   cyanocobalamin 500 MCG tablet Commonly known as: VITAMIN B12 Take 500 mcg by mouth daily.   hydrALAZINE 50 MG tablet Commonly known as: APRESOLINE Take 1.5 tablets (75 mg total) by mouth 3 (three) times daily. Increased from 50 mg. What changed:  how much to take when to take this additional instructions   isosorbide mononitrate 60 MG 24 hr tablet Commonly known as: IMDUR Take 1 tablet (60 mg total) by mouth daily.   levothyroxine 25 MCG tablet Commonly known as: SYNTHROID Take 25 mcg by mouth daily before breakfast.   multivitamin capsule Take 1 capsule by mouth daily.   omeprazole 20 MG capsule Commonly known as: PRILOSEC Take 20 mg by mouth daily.   polyethylene glycol 17 g packet Commonly known as: MIRALAX / GLYCOLAX Take 17 g by mouth daily as needed for mild constipation.   Restasis 0.05 % ophthalmic emulsion Generic drug: cycloSPORINE Place 1 drop into both eyes 2 (two) times daily.   solifenacin 5 MG tablet Commonly known as: VESICARE Take 5 mg by mouth daily.         Follow-up Information     Custovic, Rozell Searing, DO. Go in 1 week(s).   Specialty: Cardiology Contact information: [address] [place] [address] [phone]                 Allergies  Allergen Reactions   Aspirin Tinitus   Losartan Potassium-Hctz Other (See Comments)     The results of significant diagnostics from this hospitalization (including imaging, microbiology, ancillary and laboratory) are listed below for reference.   Consultations:   Procedures/Studies: ECHOCARDIOGRAM COMPLETE Result Date: 03/06/2024    ECHOCARDIOGRAM REPORT   Patient Name:   Daisy Mora Date of Exam: 03/05/2024 Medical Rec #:  [id number]        Height:       63.0 in Accession #:    [id number]       Weight:       140.0 lb Date of Birth:  24-Oct-1926          BSA:          1.662 m Patient Age:    89 years         BP:           168/65 mmHg Patient Gender: F                HR:           93 bpm. Exam Location:  [facility] Procedure: 2D Echo, Cardiac Doppler and Color Doppler (Both Spectral and Color            Flow Doppler were utilized during procedure). Indications:     CHF- Acute Diastolic I50.31  History:         Patient has no prior history of Echocardiogram examinations.  CHF, CKD, stage 3; Risk Factors:Hypertension.  Sonographer:     Lucendia Herrlich RCS Referring Phys:  [id number] CARALYN HUDSON Diagnosing Phys: Rozell Searing Custovic IMPRESSIONS  1. Left ventricular ejection fraction, by estimation, is 60 to 65%. The left ventricle has normal function. The left ventricle has no regional wall motion abnormalities. Left ventricular diastolic parameters are consistent with Grade II diastolic dysfunction (pseudonormalization).  2. Right ventricular systolic function is normal. The right ventricular size is normal.  3. Left atrial size was severely dilated.  4. The mitral valve is normal in structure. Mild mitral valve regurgitation. No evidence of mitral stenosis.  5. The aortic valve is normal in structure. Aortic valve regurgitation is not visualized. No aortic stenosis is present.  6. The inferior vena cava is normal in size with greater than 50% respiratory variability, suggesting right atrial pressure of 3 mmHg. FINDINGS  Left Ventricle: Left ventricular ejection fraction, by estimation, is 60 to 65%. The left ventricle has normal function. The left ventricle has no regional wall motion abnormalities. The left ventricular internal cavity size was normal in size. There is  no left ventricular hypertrophy. Left ventricular diastolic parameters are consistent with Grade II diastolic dysfunction (pseudonormalization). Right Ventricle: The right ventricular size is normal. No increase in right ventricular wall thickness. Right ventricular systolic function is normal.  Left Atrium: Left atrial size was severely dilated. Right Atrium: Right atrial size was normal in size. Pericardium: There is no evidence of pericardial effusion. Mitral Valve: The mitral valve is normal in structure. Mild mitral valve regurgitation. No evidence of mitral valve stenosis. MV peak gradient, 9.5 mmHg. The mean mitral valve gradient is 4.0 mmHg. Tricuspid Valve: The tricuspid valve is normal in structure. Tricuspid valve regurgitation is trivial. Aortic Valve: The aortic valve is normal in structure. Aortic valve regurgitation is not visualized. No aortic stenosis is present. Aortic valve peak gradient measures 11.2 mmHg. Pulmonic Valve: The pulmonic valve was normal in structure. Pulmonic valve regurgitation is not visualized. No evidence of pulmonic stenosis. Aorta: The aortic root is normal in size and structure. Venous: The inferior vena cava is normal in size with greater than 50% respiratory variability, suggesting right atrial pressure of 3 mmHg. IAS/Shunts: No atrial level shunt detected by color flow Doppler.  LEFT VENTRICLE PLAX 2D LVIDd:         3.12 cm   Diastology LVIDs:         1.94 cm   LV e' medial:    3.58 cm/s LV PW:         1.20 cm   LV E/e' medial:  22.5 LV IVS:        1.03 cm   LV e' lateral:   3.29 cm/s LVOT diam:     2.00 cm   LV E/e' lateral: 24.5 LV SV:         79 LV SV Index:   47 LVOT Area:     3.14 cm  RIGHT VENTRICLE             IVC RV S prime:     19.50 cm/s  IVC diam: 2.17 cm TAPSE (M-mode): 2.9 cm LEFT ATRIUM             Index        RIGHT ATRIUM           Index LA diam:        5.40 cm 3.25 cm/m   RA Area:     15.50 cm LA Vol (  A2C):   76.1 ml 45.77 ml/m  RA Volume:   39.20 ml  23.59 ml/m LA Vol (A4C):   91.9 ml 55.28 ml/m LA Biplane Vol: 91.0 ml 54.76 ml/m  AORTIC VALVE AV Area (Vmax): 2.52 cm AV Vmax:        167.00 cm/s AV Peak Grad:   11.2 mmHg LVOT Vmax:      134.00 cm/s LVOT Vmean:     90.400 cm/s LVOT VTI:       0.250 m  AORTA Ao Root diam: 3.10 cm Ao Asc  diam:  3.70 cm MITRAL VALVE                TRICUSPID VALVE MV Area (PHT): 4.10 cm     TR Peak grad:   8.3 mmHg MV Area VTI:   2.12 cm     TR Vmax:        144.00 cm/s MV Peak grad:  9.5 mmHg MV Mean grad:  4.0 mmHg     SHUNTS MV Vmax:       1.54 m/s     Systemic VTI:  0.25 m MV Vmean:      88.4 cm/s    Systemic Diam: 2.00 cm MV Decel Time: 185 msec MV E velocity: 80.50 cm/s MV A velocity: 170.00 cm/s MV E/A ratio:  0.47 Sabina Custovic Electronically signed by Clotilde Dieter Signature Date/Time: 03/06/2024/1:37:23 PM    Final    DG Chest 1 View Result Date: 03/05/2024 CLINICAL DATA:  Congestive heart failure. EXAM: CHEST  1 VIEW COMPARISON:  03/04/2024 FINDINGS: Stable cardiac enlargement. Stable small bilateral pleural effusions. Degree of pulmonary interstitial edema may be mildly improved. No focal airspace consolidation or pneumothorax. IMPRESSION: Mild improvement in pulmonary interstitial edema. Stable small bilateral pleural effusions. Stable cardiac enlargement. Electronically Signed   By: Irish Lack M.D.   On: 03/05/2024 11:10   DG Chest Port 1 View Result Date: 03/04/2024 CLINICAL DATA:  [id number] with shortness of breath onset yesterday. EXAM: PORTABLE CHEST 1 VIEW COMPARISON:  Portable chest 12/22/2021 FINDINGS: The patient is rotated to the right exaggerating the mediastinum. The aorta is tortuous and calcified. The heart is moderately enlarged. The mitral ring is heavily calcified. There is perihilar vascular congestion, mild generalized interstitial consolidation consistent with edema and small pleural effusions. Findings consistent with CHF or fluid overload. Scattered bilateral perihilar opacities are also noted, could be due to ground-glass edema or pneumonia. No other focal infiltrate is seen. There is slight thickening in the right horizontal fissure most likely due to fluid in the fissure. Additional right perihilar linear scar-like opacity. There is levoscoliosis and degenerative  change of the spine with osteopenia. No new osseous findings. IMPRESSION: 1. Cardiomegaly with perihilar vascular congestion, mild generalized interstitial consolidation consistent with edema and small pleural effusions. Findings consistent with CHF or fluid overload. 2. Scattered bilateral perihilar opacities could be due to ground-glass edema or pneumonia. 3. Aortic atherosclerosis. 4. Osteopenia and degenerative change. Electronically Signed   By: Almira Bar M.D.   On: 03/04/2024 04:26      Labs: BNP (last 3 results) Recent Labs    03/04/24 0409  BNP 1,003.2*   Basic Metabolic Panel: Recent Labs  Lab 03/08/24 0520 03/09/24 1227 03/10/24 0520 03/11/24 0558 03/12/24 0611  NA 139 136 135 138 135  K 4.0 4.6 4.4 4.6 4.7  CL 107 106 109 111 110  CO2 21* 22 19* 19* 19*  GLUCOSE 102* 134* 96 85 88  BUN 64* 73*  74* 70* 76*  CREATININE 2.23* 2.96* 2.41* 1.98* 2.12*  CALCIUM 8.7* 8.5* 8.1* 8.4* 8.8*  MG  --   --   --   --  2.3   Liver Function Tests: Recent Labs  Lab 03/09/24 1227 03/10/24 0520  AST 16 13*  ALT 12 12  ALKPHOS 47 43  BILITOT 0.7 0.9  PROT 6.3* 5.7*  ALBUMIN 3.4* 3.0*   No results for input(s): "LIPASE", "AMYLASE" in the last 168 hours. No results for input(s): "AMMONIA" in the last 168 hours. CBC: Recent Labs  Lab 03/07/24 0555 03/08/24 0520 03/09/24 1227 03/10/24 0520 03/12/24 0611  WBC 6.8 6.9 7.2 6.1 6.2  NEUTROABS 4.2 4.2  --   --   --   HGB 9.8* 9.5* 9.7* 8.6* 9.1*  HCT 29.3* 28.8* 30.5* 26.2* 27.7*  MCV 92.1 95.0 95.3 92.6 93.0  PLT 192 184 199 183 216   Cardiac Enzymes: No results for input(s): "CKTOTAL", "CKMB", "CKMBINDEX", "TROPONINI" in the last 168 hours. BNP: Invalid input(s): "POCBNP" CBG: Recent Labs  Lab 03/09/24 1213  GLUCAP 129*   D-Dimer No results for input(s): "DDIMER" in the last 72 hours. Hgb A1c No results for input(s): "HGBA1C" in the last 72 hours. Lipid Profile No results for input(s): "CHOL", "HDL",  "LDLCALC", "TRIG", "CHOLHDL", "LDLDIRECT" in the last 72 hours. Thyroid function studies No results for input(s): "TSH", "T4TOTAL", "T3FREE", "THYROIDAB" in the last 72 hours.  Invalid input(s): "FREET3" Anemia work up No results for input(s): "VITAMINB12", "FOLATE", "FERRITIN", "TIBC", "IRON", "RETICCTPCT" in the last 72 hours. Urinalysis    Component Value Date/Time   COLORURINE YELLOW (A) 03/09/2024 1938   APPEARANCEUR CLOUDY (A) 03/09/2024 1938   APPEARANCEUR CLOUDY 07/08/2013 1708   LABSPEC 1.015 03/09/2024 1938   LABSPEC 1.015 07/08/2013 1708   PHURINE 5.0 03/09/2024 1938   GLUCOSEU NEGATIVE 03/09/2024 1938   GLUCOSEU NEGATIVE 07/08/2013 1708   HGBUR NEGATIVE 03/09/2024 1938   BILIRUBINUR NEGATIVE 03/09/2024 1938   BILIRUBINUR NEGATIVE 07/08/2013 1708   KETONESUR NEGATIVE 03/09/2024 1938   PROTEINUR NEGATIVE 03/09/2024 1938   NITRITE NEGATIVE 03/09/2024 1938   LEUKOCYTESUR LARGE (A) 03/09/2024 1938   LEUKOCYTESUR 3+ 07/08/2013 1708   Sepsis Labs Recent Labs  Lab 03/08/24 0520 03/09/24 1227 03/10/24 0520 03/12/24 0611  WBC 6.9 7.2 6.1 6.2   Microbiology No results found for this or any previous visit (from the past 240 hours).   Total time spend on discharging this patient, including the last patient exam, discussing the hospital stay, instructions for ongoing care as it relates to all pertinent caregivers, as well as preparing the medical discharge records, prescriptions, and/or referrals as applicable, is 45 minutes.    Darlin Priestly, MD  [facility] 03/12/2024, 8:52 AM

## 2024-03-12 NOTE — TOC Transition Note (Signed)
 Transition of Care [facility]) - Discharge Note   Patient Details  Name: Daisy Mora MRN: [id number] Date of Birth: March 18, 1926  Transition of Care [facility]) CM/SW Contact:  Chapman Fitch, RN Phone Number: 03/12/2024, 10:51 AM   Clinical Narrative:      Patient to discharge today [place] with [facility] notified of discharge Patient will not require resumption orders due to being observation status    Barriers to Discharge: Continued Medical Work up   Patient Goals and CMS Choice   CMS Medicare.gov Compare Post Acute Care list provided to:: Patient Choice offered to / list presented to : Patient [place] ownership interest in [facility].provided to:: Patient    Discharge Placement                       Discharge Plan and Services Additional resources added to the After Visit Summary for                              Pipeline [facility] Agency: [facility]        Social Drivers of Health (SDOH) Interventions SDOH Screenings   Food Insecurity: No Food Insecurity (03/09/2024)  Housing: Low Risk  (03/09/2024)  Transportation Needs: No Transportation Needs (03/09/2024)  Utilities: Not At Risk (03/09/2024)  Financial Resource Strain: Low Risk  (06/12/2019)   Received from [facility]  Physical Activity: Sufficiently Active (06/12/2019)   Received from [facility]  Social Connections: Socially Isolated (03/09/2024)  Tobacco Use: Low Risk  (03/09/2024)     Readmission Risk Interventions    12/24/2021    1:37 PM  Readmission Risk Prevention Plan  Transportation Screening Complete  PCP or Specialist Appt within 5-7 Days Complete  Home Care Screening Complete  Medication Review (RN CM) Complete

## 2024-03-14 DIAGNOSIS — Z8679 Personal history of other diseases of the circulatory system: Secondary | ICD-10-CM | POA: Diagnosis not present

## 2024-03-14 DIAGNOSIS — R5381 Other malaise: Secondary | ICD-10-CM | POA: Diagnosis not present

## 2024-03-14 DIAGNOSIS — N183 Chronic kidney disease, stage 3 unspecified: Secondary | ICD-10-CM | POA: Diagnosis not present

## 2024-03-14 DIAGNOSIS — Z87891 Personal history of nicotine dependence: Secondary | ICD-10-CM | POA: Diagnosis not present

## 2024-03-14 DIAGNOSIS — I5043 Acute on chronic combined systolic (congestive) and diastolic (congestive) heart failure: Secondary | ICD-10-CM | POA: Diagnosis not present

## 2024-03-14 DIAGNOSIS — I130 Hypertensive heart and chronic kidney disease with heart failure and stage 1 through stage 4 chronic kidney disease, or unspecified chronic kidney disease: Secondary | ICD-10-CM | POA: Diagnosis not present

## 2024-03-14 DIAGNOSIS — Z09 Encounter for follow-up examination after completed treatment for conditions other than malignant neoplasm: Secondary | ICD-10-CM | POA: Diagnosis not present

## 2024-03-16 DIAGNOSIS — E039 Hypothyroidism, unspecified: Secondary | ICD-10-CM | POA: Diagnosis not present

## 2024-03-16 DIAGNOSIS — N1832 Chronic kidney disease, stage 3b: Secondary | ICD-10-CM | POA: Diagnosis not present

## 2024-03-16 DIAGNOSIS — I517 Cardiomegaly: Secondary | ICD-10-CM | POA: Diagnosis not present

## 2024-03-16 DIAGNOSIS — R0602 Shortness of breath: Secondary | ICD-10-CM | POA: Diagnosis not present

## 2024-03-16 DIAGNOSIS — J90 Pleural effusion, not elsewhere classified: Secondary | ICD-10-CM | POA: Diagnosis not present

## 2024-03-16 DIAGNOSIS — R0600 Dyspnea, unspecified: Secondary | ICD-10-CM | POA: Diagnosis not present

## 2024-03-16 DIAGNOSIS — I130 Hypertensive heart and chronic kidney disease with heart failure and stage 1 through stage 4 chronic kidney disease, or unspecified chronic kidney disease: Secondary | ICD-10-CM | POA: Diagnosis not present

## 2024-03-16 DIAGNOSIS — J811 Chronic pulmonary edema: Secondary | ICD-10-CM | POA: Diagnosis not present

## 2024-03-16 DIAGNOSIS — I5033 Acute on chronic diastolic (congestive) heart failure: Secondary | ICD-10-CM | POA: Diagnosis not present

## 2024-03-16 DIAGNOSIS — I5030 Unspecified diastolic (congestive) heart failure: Secondary | ICD-10-CM | POA: Diagnosis not present

## 2024-03-16 DIAGNOSIS — R600 Localized edema: Secondary | ICD-10-CM | POA: Diagnosis not present

## 2024-03-17 DIAGNOSIS — N1832 Chronic kidney disease, stage 3b: Secondary | ICD-10-CM | POA: Diagnosis not present

## 2024-03-17 DIAGNOSIS — I5033 Acute on chronic diastolic (congestive) heart failure: Secondary | ICD-10-CM | POA: Diagnosis not present

## 2024-03-17 DIAGNOSIS — I130 Hypertensive heart and chronic kidney disease with heart failure and stage 1 through stage 4 chronic kidney disease, or unspecified chronic kidney disease: Secondary | ICD-10-CM | POA: Diagnosis not present

## 2024-03-18 DIAGNOSIS — I130 Hypertensive heart and chronic kidney disease with heart failure and stage 1 through stage 4 chronic kidney disease, or unspecified chronic kidney disease: Secondary | ICD-10-CM | POA: Diagnosis not present

## 2024-03-18 DIAGNOSIS — N1832 Chronic kidney disease, stage 3b: Secondary | ICD-10-CM | POA: Diagnosis not present

## 2024-03-18 DIAGNOSIS — J810 Acute pulmonary edema: Secondary | ICD-10-CM | POA: Diagnosis not present

## 2024-03-18 DIAGNOSIS — I5030 Unspecified diastolic (congestive) heart failure: Secondary | ICD-10-CM | POA: Diagnosis not present

## 2024-03-19 DIAGNOSIS — I5031 Acute diastolic (congestive) heart failure: Secondary | ICD-10-CM | POA: Diagnosis not present

## 2024-03-25 DIAGNOSIS — I10 Essential (primary) hypertension: Secondary | ICD-10-CM | POA: Diagnosis not present

## 2024-03-25 DIAGNOSIS — R0600 Dyspnea, unspecified: Secondary | ICD-10-CM | POA: Diagnosis not present

## 2024-03-25 DIAGNOSIS — R609 Edema, unspecified: Secondary | ICD-10-CM | POA: Diagnosis not present

## 2024-03-25 DIAGNOSIS — I5033 Acute on chronic diastolic (congestive) heart failure: Secondary | ICD-10-CM | POA: Diagnosis not present

## 2024-03-25 DIAGNOSIS — N183 Chronic kidney disease, stage 3 unspecified: Secondary | ICD-10-CM | POA: Diagnosis not present

## 2024-03-25 DIAGNOSIS — I5031 Acute diastolic (congestive) heart failure: Secondary | ICD-10-CM | POA: Diagnosis not present

## 2024-03-25 DIAGNOSIS — Z09 Encounter for follow-up examination after completed treatment for conditions other than malignant neoplasm: Secondary | ICD-10-CM | POA: Diagnosis not present

## 2024-03-25 DIAGNOSIS — R600 Localized edema: Secondary | ICD-10-CM | POA: Diagnosis not present

## 2024-03-28 DIAGNOSIS — J441 Chronic obstructive pulmonary disease with (acute) exacerbation: Secondary | ICD-10-CM | POA: Diagnosis not present

## 2024-03-28 DIAGNOSIS — I252 Old myocardial infarction: Secondary | ICD-10-CM | POA: Diagnosis not present

## 2024-03-28 DIAGNOSIS — I130 Hypertensive heart and chronic kidney disease with heart failure and stage 1 through stage 4 chronic kidney disease, or unspecified chronic kidney disease: Secondary | ICD-10-CM | POA: Diagnosis not present

## 2024-03-28 DIAGNOSIS — N179 Acute kidney failure, unspecified: Secondary | ICD-10-CM | POA: Diagnosis not present

## 2024-03-28 DIAGNOSIS — I5033 Acute on chronic diastolic (congestive) heart failure: Secondary | ICD-10-CM | POA: Diagnosis not present

## 2024-03-28 DIAGNOSIS — N1832 Chronic kidney disease, stage 3b: Secondary | ICD-10-CM | POA: Diagnosis not present

## 2024-03-28 DIAGNOSIS — I2489 Other forms of acute ischemic heart disease: Secondary | ICD-10-CM | POA: Diagnosis not present

## 2024-03-28 DIAGNOSIS — I161 Hypertensive emergency: Secondary | ICD-10-CM | POA: Diagnosis not present

## 2024-03-28 DIAGNOSIS — I272 Pulmonary hypertension, unspecified: Secondary | ICD-10-CM | POA: Diagnosis not present

## 2024-04-01 DIAGNOSIS — I10 Essential (primary) hypertension: Secondary | ICD-10-CM | POA: Diagnosis not present

## 2024-04-01 DIAGNOSIS — R600 Localized edema: Secondary | ICD-10-CM | POA: Diagnosis not present

## 2024-04-01 DIAGNOSIS — I5032 Chronic diastolic (congestive) heart failure: Secondary | ICD-10-CM | POA: Diagnosis not present

## 2024-04-01 DIAGNOSIS — R011 Cardiac murmur, unspecified: Secondary | ICD-10-CM | POA: Diagnosis not present

## 2024-04-01 DIAGNOSIS — R5383 Other fatigue: Secondary | ICD-10-CM | POA: Diagnosis not present

## 2024-04-01 DIAGNOSIS — N183 Chronic kidney disease, stage 3 unspecified: Secondary | ICD-10-CM | POA: Diagnosis not present

## 2024-04-03 DIAGNOSIS — N1832 Chronic kidney disease, stage 3b: Secondary | ICD-10-CM | POA: Diagnosis not present

## 2024-04-03 DIAGNOSIS — I130 Hypertensive heart and chronic kidney disease with heart failure and stage 1 through stage 4 chronic kidney disease, or unspecified chronic kidney disease: Secondary | ICD-10-CM | POA: Diagnosis not present

## 2024-04-03 DIAGNOSIS — I161 Hypertensive emergency: Secondary | ICD-10-CM | POA: Diagnosis not present

## 2024-04-03 DIAGNOSIS — J441 Chronic obstructive pulmonary disease with (acute) exacerbation: Secondary | ICD-10-CM | POA: Diagnosis not present

## 2024-04-03 DIAGNOSIS — I252 Old myocardial infarction: Secondary | ICD-10-CM | POA: Diagnosis not present

## 2024-04-03 DIAGNOSIS — I5033 Acute on chronic diastolic (congestive) heart failure: Secondary | ICD-10-CM | POA: Diagnosis not present

## 2024-04-03 DIAGNOSIS — N179 Acute kidney failure, unspecified: Secondary | ICD-10-CM | POA: Diagnosis not present

## 2024-04-03 DIAGNOSIS — I2489 Other forms of acute ischemic heart disease: Secondary | ICD-10-CM | POA: Diagnosis not present

## 2024-04-03 DIAGNOSIS — I272 Pulmonary hypertension, unspecified: Secondary | ICD-10-CM | POA: Diagnosis not present

## 2024-04-04 DIAGNOSIS — I161 Hypertensive emergency: Secondary | ICD-10-CM | POA: Diagnosis not present

## 2024-04-04 DIAGNOSIS — N179 Acute kidney failure, unspecified: Secondary | ICD-10-CM | POA: Diagnosis not present

## 2024-04-04 DIAGNOSIS — I252 Old myocardial infarction: Secondary | ICD-10-CM | POA: Diagnosis not present

## 2024-04-04 DIAGNOSIS — N1832 Chronic kidney disease, stage 3b: Secondary | ICD-10-CM | POA: Diagnosis not present

## 2024-04-04 DIAGNOSIS — J441 Chronic obstructive pulmonary disease with (acute) exacerbation: Secondary | ICD-10-CM | POA: Diagnosis not present

## 2024-04-04 DIAGNOSIS — I272 Pulmonary hypertension, unspecified: Secondary | ICD-10-CM | POA: Diagnosis not present

## 2024-04-04 DIAGNOSIS — I2489 Other forms of acute ischemic heart disease: Secondary | ICD-10-CM | POA: Diagnosis not present

## 2024-04-04 DIAGNOSIS — I5033 Acute on chronic diastolic (congestive) heart failure: Secondary | ICD-10-CM | POA: Diagnosis not present

## 2024-04-04 DIAGNOSIS — I130 Hypertensive heart and chronic kidney disease with heart failure and stage 1 through stage 4 chronic kidney disease, or unspecified chronic kidney disease: Secondary | ICD-10-CM | POA: Diagnosis not present

## 2024-04-04 DIAGNOSIS — N183 Chronic kidney disease, stage 3 unspecified: Secondary | ICD-10-CM | POA: Diagnosis not present

## 2024-04-08 DIAGNOSIS — N1832 Chronic kidney disease, stage 3b: Secondary | ICD-10-CM | POA: Diagnosis not present

## 2024-04-08 DIAGNOSIS — N179 Acute kidney failure, unspecified: Secondary | ICD-10-CM | POA: Diagnosis not present

## 2024-04-08 DIAGNOSIS — I161 Hypertensive emergency: Secondary | ICD-10-CM | POA: Diagnosis not present

## 2024-04-08 DIAGNOSIS — I5033 Acute on chronic diastolic (congestive) heart failure: Secondary | ICD-10-CM | POA: Diagnosis not present

## 2024-04-08 DIAGNOSIS — I2489 Other forms of acute ischemic heart disease: Secondary | ICD-10-CM | POA: Diagnosis not present

## 2024-04-08 DIAGNOSIS — I252 Old myocardial infarction: Secondary | ICD-10-CM | POA: Diagnosis not present

## 2024-04-08 DIAGNOSIS — I130 Hypertensive heart and chronic kidney disease with heart failure and stage 1 through stage 4 chronic kidney disease, or unspecified chronic kidney disease: Secondary | ICD-10-CM | POA: Diagnosis not present

## 2024-04-08 DIAGNOSIS — I272 Pulmonary hypertension, unspecified: Secondary | ICD-10-CM | POA: Diagnosis not present

## 2024-04-08 DIAGNOSIS — J441 Chronic obstructive pulmonary disease with (acute) exacerbation: Secondary | ICD-10-CM | POA: Diagnosis not present

## 2024-04-10 DIAGNOSIS — N1832 Chronic kidney disease, stage 3b: Secondary | ICD-10-CM | POA: Diagnosis not present

## 2024-04-10 DIAGNOSIS — I5031 Acute diastolic (congestive) heart failure: Secondary | ICD-10-CM | POA: Diagnosis not present

## 2024-04-10 DIAGNOSIS — I2489 Other forms of acute ischemic heart disease: Secondary | ICD-10-CM | POA: Diagnosis not present

## 2024-04-10 DIAGNOSIS — I5032 Chronic diastolic (congestive) heart failure: Secondary | ICD-10-CM | POA: Diagnosis not present

## 2024-04-10 DIAGNOSIS — J441 Chronic obstructive pulmonary disease with (acute) exacerbation: Secondary | ICD-10-CM | POA: Diagnosis not present

## 2024-04-10 DIAGNOSIS — E875 Hyperkalemia: Secondary | ICD-10-CM | POA: Diagnosis not present

## 2024-04-10 DIAGNOSIS — I272 Pulmonary hypertension, unspecified: Secondary | ICD-10-CM | POA: Diagnosis not present

## 2024-04-10 DIAGNOSIS — I130 Hypertensive heart and chronic kidney disease with heart failure and stage 1 through stage 4 chronic kidney disease, or unspecified chronic kidney disease: Secondary | ICD-10-CM | POA: Diagnosis not present

## 2024-04-10 DIAGNOSIS — I10 Essential (primary) hypertension: Secondary | ICD-10-CM | POA: Diagnosis not present

## 2024-04-10 DIAGNOSIS — I161 Hypertensive emergency: Secondary | ICD-10-CM | POA: Diagnosis not present

## 2024-04-10 DIAGNOSIS — N183 Chronic kidney disease, stage 3 unspecified: Secondary | ICD-10-CM | POA: Diagnosis not present

## 2024-04-10 DIAGNOSIS — R0600 Dyspnea, unspecified: Secondary | ICD-10-CM | POA: Diagnosis not present

## 2024-04-10 DIAGNOSIS — E7849 Other hyperlipidemia: Secondary | ICD-10-CM | POA: Diagnosis not present

## 2024-04-10 DIAGNOSIS — I252 Old myocardial infarction: Secondary | ICD-10-CM | POA: Diagnosis not present

## 2024-04-10 DIAGNOSIS — I5033 Acute on chronic diastolic (congestive) heart failure: Secondary | ICD-10-CM | POA: Diagnosis not present

## 2024-04-10 DIAGNOSIS — N179 Acute kidney failure, unspecified: Secondary | ICD-10-CM | POA: Diagnosis not present

## 2024-04-10 DIAGNOSIS — R600 Localized edema: Secondary | ICD-10-CM | POA: Diagnosis not present

## 2024-04-10 DIAGNOSIS — R011 Cardiac murmur, unspecified: Secondary | ICD-10-CM | POA: Diagnosis not present

## 2024-04-18 DIAGNOSIS — E875 Hyperkalemia: Secondary | ICD-10-CM | POA: Diagnosis not present

## 2024-04-18 DIAGNOSIS — N183 Chronic kidney disease, stage 3 unspecified: Secondary | ICD-10-CM | POA: Diagnosis not present

## 2024-04-24 DIAGNOSIS — R600 Localized edema: Secondary | ICD-10-CM | POA: Diagnosis not present

## 2024-04-24 DIAGNOSIS — I2489 Other forms of acute ischemic heart disease: Secondary | ICD-10-CM | POA: Diagnosis not present

## 2024-04-24 DIAGNOSIS — I5032 Chronic diastolic (congestive) heart failure: Secondary | ICD-10-CM | POA: Diagnosis not present

## 2024-04-24 DIAGNOSIS — N179 Acute kidney failure, unspecified: Secondary | ICD-10-CM | POA: Diagnosis not present

## 2024-04-24 DIAGNOSIS — I161 Hypertensive emergency: Secondary | ICD-10-CM | POA: Diagnosis not present

## 2024-04-24 DIAGNOSIS — I5033 Acute on chronic diastolic (congestive) heart failure: Secondary | ICD-10-CM | POA: Diagnosis not present

## 2024-04-24 DIAGNOSIS — I10 Essential (primary) hypertension: Secondary | ICD-10-CM | POA: Diagnosis not present

## 2024-04-24 DIAGNOSIS — E875 Hyperkalemia: Secondary | ICD-10-CM | POA: Diagnosis not present

## 2024-04-24 DIAGNOSIS — I130 Hypertensive heart and chronic kidney disease with heart failure and stage 1 through stage 4 chronic kidney disease, or unspecified chronic kidney disease: Secondary | ICD-10-CM | POA: Diagnosis not present

## 2024-04-24 DIAGNOSIS — N183 Chronic kidney disease, stage 3 unspecified: Secondary | ICD-10-CM | POA: Diagnosis not present

## 2024-04-24 DIAGNOSIS — J441 Chronic obstructive pulmonary disease with (acute) exacerbation: Secondary | ICD-10-CM | POA: Diagnosis not present

## 2024-04-24 DIAGNOSIS — I252 Old myocardial infarction: Secondary | ICD-10-CM | POA: Diagnosis not present

## 2024-04-24 DIAGNOSIS — R011 Cardiac murmur, unspecified: Secondary | ICD-10-CM | POA: Diagnosis not present

## 2024-04-24 DIAGNOSIS — N1832 Chronic kidney disease, stage 3b: Secondary | ICD-10-CM | POA: Diagnosis not present

## 2024-04-24 DIAGNOSIS — I272 Pulmonary hypertension, unspecified: Secondary | ICD-10-CM | POA: Diagnosis not present

## 2024-04-24 DIAGNOSIS — E7849 Other hyperlipidemia: Secondary | ICD-10-CM | POA: Diagnosis not present

## 2024-04-25 DIAGNOSIS — I161 Hypertensive emergency: Secondary | ICD-10-CM | POA: Diagnosis not present

## 2024-04-25 DIAGNOSIS — I2489 Other forms of acute ischemic heart disease: Secondary | ICD-10-CM | POA: Diagnosis not present

## 2024-04-25 DIAGNOSIS — N1832 Chronic kidney disease, stage 3b: Secondary | ICD-10-CM | POA: Diagnosis not present

## 2024-04-25 DIAGNOSIS — J441 Chronic obstructive pulmonary disease with (acute) exacerbation: Secondary | ICD-10-CM | POA: Diagnosis not present

## 2024-04-25 DIAGNOSIS — I5033 Acute on chronic diastolic (congestive) heart failure: Secondary | ICD-10-CM | POA: Diagnosis not present

## 2024-04-25 DIAGNOSIS — I130 Hypertensive heart and chronic kidney disease with heart failure and stage 1 through stage 4 chronic kidney disease, or unspecified chronic kidney disease: Secondary | ICD-10-CM | POA: Diagnosis not present

## 2024-04-25 DIAGNOSIS — I272 Pulmonary hypertension, unspecified: Secondary | ICD-10-CM | POA: Diagnosis not present

## 2024-04-25 DIAGNOSIS — N179 Acute kidney failure, unspecified: Secondary | ICD-10-CM | POA: Diagnosis not present

## 2024-04-25 DIAGNOSIS — I252 Old myocardial infarction: Secondary | ICD-10-CM | POA: Diagnosis not present

## 2024-04-27 DIAGNOSIS — I252 Old myocardial infarction: Secondary | ICD-10-CM | POA: Diagnosis not present

## 2024-04-27 DIAGNOSIS — I130 Hypertensive heart and chronic kidney disease with heart failure and stage 1 through stage 4 chronic kidney disease, or unspecified chronic kidney disease: Secondary | ICD-10-CM | POA: Diagnosis not present

## 2024-04-27 DIAGNOSIS — I272 Pulmonary hypertension, unspecified: Secondary | ICD-10-CM | POA: Diagnosis not present

## 2024-04-27 DIAGNOSIS — I2489 Other forms of acute ischemic heart disease: Secondary | ICD-10-CM | POA: Diagnosis not present

## 2024-04-27 DIAGNOSIS — I5033 Acute on chronic diastolic (congestive) heart failure: Secondary | ICD-10-CM | POA: Diagnosis not present

## 2024-04-27 DIAGNOSIS — J441 Chronic obstructive pulmonary disease with (acute) exacerbation: Secondary | ICD-10-CM | POA: Diagnosis not present

## 2024-04-27 DIAGNOSIS — N179 Acute kidney failure, unspecified: Secondary | ICD-10-CM | POA: Diagnosis not present

## 2024-04-27 DIAGNOSIS — N1832 Chronic kidney disease, stage 3b: Secondary | ICD-10-CM | POA: Diagnosis not present

## 2024-04-27 DIAGNOSIS — I161 Hypertensive emergency: Secondary | ICD-10-CM | POA: Diagnosis not present

## 2024-05-02 DIAGNOSIS — I272 Pulmonary hypertension, unspecified: Secondary | ICD-10-CM | POA: Diagnosis not present

## 2024-05-02 DIAGNOSIS — I130 Hypertensive heart and chronic kidney disease with heart failure and stage 1 through stage 4 chronic kidney disease, or unspecified chronic kidney disease: Secondary | ICD-10-CM | POA: Diagnosis not present

## 2024-05-02 DIAGNOSIS — I5033 Acute on chronic diastolic (congestive) heart failure: Secondary | ICD-10-CM | POA: Diagnosis not present

## 2024-05-02 DIAGNOSIS — J441 Chronic obstructive pulmonary disease with (acute) exacerbation: Secondary | ICD-10-CM | POA: Diagnosis not present

## 2024-05-02 DIAGNOSIS — N1832 Chronic kidney disease, stage 3b: Secondary | ICD-10-CM | POA: Diagnosis not present

## 2024-05-02 DIAGNOSIS — I252 Old myocardial infarction: Secondary | ICD-10-CM | POA: Diagnosis not present

## 2024-05-02 DIAGNOSIS — I2489 Other forms of acute ischemic heart disease: Secondary | ICD-10-CM | POA: Diagnosis not present

## 2024-05-02 DIAGNOSIS — N179 Acute kidney failure, unspecified: Secondary | ICD-10-CM | POA: Diagnosis not present

## 2024-05-02 DIAGNOSIS — I161 Hypertensive emergency: Secondary | ICD-10-CM | POA: Diagnosis not present

## 2024-05-07 DIAGNOSIS — I161 Hypertensive emergency: Secondary | ICD-10-CM | POA: Diagnosis not present

## 2024-05-07 DIAGNOSIS — J441 Chronic obstructive pulmonary disease with (acute) exacerbation: Secondary | ICD-10-CM | POA: Diagnosis not present

## 2024-05-07 DIAGNOSIS — I5033 Acute on chronic diastolic (congestive) heart failure: Secondary | ICD-10-CM | POA: Diagnosis not present

## 2024-05-07 DIAGNOSIS — I2489 Other forms of acute ischemic heart disease: Secondary | ICD-10-CM | POA: Diagnosis not present

## 2024-05-07 DIAGNOSIS — I272 Pulmonary hypertension, unspecified: Secondary | ICD-10-CM | POA: Diagnosis not present

## 2024-05-07 DIAGNOSIS — N179 Acute kidney failure, unspecified: Secondary | ICD-10-CM | POA: Diagnosis not present

## 2024-05-07 DIAGNOSIS — I252 Old myocardial infarction: Secondary | ICD-10-CM | POA: Diagnosis not present

## 2024-05-07 DIAGNOSIS — N1832 Chronic kidney disease, stage 3b: Secondary | ICD-10-CM | POA: Diagnosis not present

## 2024-05-07 DIAGNOSIS — I130 Hypertensive heart and chronic kidney disease with heart failure and stage 1 through stage 4 chronic kidney disease, or unspecified chronic kidney disease: Secondary | ICD-10-CM | POA: Diagnosis not present

## 2024-05-21 DIAGNOSIS — I130 Hypertensive heart and chronic kidney disease with heart failure and stage 1 through stage 4 chronic kidney disease, or unspecified chronic kidney disease: Secondary | ICD-10-CM | POA: Diagnosis not present

## 2024-05-21 DIAGNOSIS — N179 Acute kidney failure, unspecified: Secondary | ICD-10-CM | POA: Diagnosis not present

## 2024-05-21 DIAGNOSIS — I272 Pulmonary hypertension, unspecified: Secondary | ICD-10-CM | POA: Diagnosis not present

## 2024-05-21 DIAGNOSIS — N1832 Chronic kidney disease, stage 3b: Secondary | ICD-10-CM | POA: Diagnosis not present

## 2024-05-21 DIAGNOSIS — I252 Old myocardial infarction: Secondary | ICD-10-CM | POA: Diagnosis not present

## 2024-05-21 DIAGNOSIS — I161 Hypertensive emergency: Secondary | ICD-10-CM | POA: Diagnosis not present

## 2024-05-21 DIAGNOSIS — I2489 Other forms of acute ischemic heart disease: Secondary | ICD-10-CM | POA: Diagnosis not present

## 2024-05-21 DIAGNOSIS — I5033 Acute on chronic diastolic (congestive) heart failure: Secondary | ICD-10-CM | POA: Diagnosis not present

## 2024-05-21 DIAGNOSIS — J441 Chronic obstructive pulmonary disease with (acute) exacerbation: Secondary | ICD-10-CM | POA: Diagnosis not present

## 2024-06-17 DIAGNOSIS — I5032 Chronic diastolic (congestive) heart failure: Secondary | ICD-10-CM | POA: Diagnosis not present

## 2024-06-17 DIAGNOSIS — I10 Essential (primary) hypertension: Secondary | ICD-10-CM | POA: Diagnosis not present

## 2024-06-17 DIAGNOSIS — R011 Cardiac murmur, unspecified: Secondary | ICD-10-CM | POA: Diagnosis not present

## 2024-06-17 DIAGNOSIS — E875 Hyperkalemia: Secondary | ICD-10-CM | POA: Diagnosis not present

## 2024-06-17 DIAGNOSIS — R600 Localized edema: Secondary | ICD-10-CM | POA: Diagnosis not present

## 2024-06-17 DIAGNOSIS — E7849 Other hyperlipidemia: Secondary | ICD-10-CM | POA: Diagnosis not present

## 2024-06-17 DIAGNOSIS — N183 Chronic kidney disease, stage 3 unspecified: Secondary | ICD-10-CM | POA: Diagnosis not present

## 2024-09-20 DIAGNOSIS — I10 Essential (primary) hypertension: Secondary | ICD-10-CM | POA: Diagnosis not present

## 2024-09-20 DIAGNOSIS — Z23 Encounter for immunization: Secondary | ICD-10-CM | POA: Diagnosis not present

## 2024-09-20 DIAGNOSIS — E7849 Other hyperlipidemia: Secondary | ICD-10-CM | POA: Diagnosis not present

## 2024-09-20 DIAGNOSIS — E875 Hyperkalemia: Secondary | ICD-10-CM | POA: Diagnosis not present

## 2024-09-20 DIAGNOSIS — N183 Chronic kidney disease, stage 3 unspecified: Secondary | ICD-10-CM | POA: Diagnosis not present

## 2024-09-20 DIAGNOSIS — R011 Cardiac murmur, unspecified: Secondary | ICD-10-CM | POA: Diagnosis not present

## 2024-09-20 DIAGNOSIS — I5032 Chronic diastolic (congestive) heart failure: Secondary | ICD-10-CM | POA: Diagnosis not present

## 2024-10-15 DIAGNOSIS — Z1331 Encounter for screening for depression: Secondary | ICD-10-CM | POA: Diagnosis not present

## 2024-10-15 DIAGNOSIS — R3989 Other symptoms and signs involving the genitourinary system: Secondary | ICD-10-CM | POA: Diagnosis not present

## 2024-10-18 DIAGNOSIS — M5187 Other intervertebral disc disorders, lumbosacral region: Secondary | ICD-10-CM | POA: Diagnosis not present

## 2024-10-18 DIAGNOSIS — M06061 Rheumatoid arthritis without rheumatoid factor, right knee: Secondary | ICD-10-CM | POA: Diagnosis not present

## 2024-10-18 DIAGNOSIS — M1712 Unilateral primary osteoarthritis, left knee: Secondary | ICD-10-CM | POA: Diagnosis not present

## 2024-10-18 DIAGNOSIS — M1711 Unilateral primary osteoarthritis, right knee: Secondary | ICD-10-CM | POA: Diagnosis not present

## 2024-10-18 DIAGNOSIS — M06062 Rheumatoid arthritis without rheumatoid factor, left knee: Secondary | ICD-10-CM | POA: Diagnosis not present

## 2024-10-22 DIAGNOSIS — Z87440 Personal history of urinary (tract) infections: Secondary | ICD-10-CM | POA: Diagnosis not present

## 2024-10-22 DIAGNOSIS — R4189 Other symptoms and signs involving cognitive functions and awareness: Secondary | ICD-10-CM | POA: Diagnosis not present

## 2024-10-22 DIAGNOSIS — R634 Abnormal weight loss: Secondary | ICD-10-CM | POA: Diagnosis not present

## 2024-10-22 DIAGNOSIS — R399 Unspecified symptoms and signs involving the genitourinary system: Secondary | ICD-10-CM | POA: Diagnosis not present

## 2024-11-05 DIAGNOSIS — D631 Anemia in chronic kidney disease: Secondary | ICD-10-CM | POA: Diagnosis not present

## 2024-11-05 DIAGNOSIS — R944 Abnormal results of kidney function studies: Secondary | ICD-10-CM | POA: Diagnosis not present

## 2024-11-05 DIAGNOSIS — E559 Vitamin D deficiency, unspecified: Secondary | ICD-10-CM | POA: Diagnosis not present

## 2024-11-05 DIAGNOSIS — R7989 Other specified abnormal findings of blood chemistry: Secondary | ICD-10-CM | POA: Diagnosis not present

## 2024-11-05 DIAGNOSIS — E039 Hypothyroidism, unspecified: Secondary | ICD-10-CM | POA: Diagnosis not present

## 2024-11-05 DIAGNOSIS — E782 Mixed hyperlipidemia: Secondary | ICD-10-CM | POA: Diagnosis not present

## 2024-11-05 DIAGNOSIS — K5900 Constipation, unspecified: Secondary | ICD-10-CM | POA: Diagnosis not present

## 2024-11-05 DIAGNOSIS — N1832 Chronic kidney disease, stage 3b: Secondary | ICD-10-CM | POA: Diagnosis not present

## 2024-11-05 DIAGNOSIS — R7303 Prediabetes: Secondary | ICD-10-CM | POA: Diagnosis not present

## 2024-11-05 DIAGNOSIS — Z79899 Other long term (current) drug therapy: Secondary | ICD-10-CM | POA: Diagnosis not present

## 2024-11-05 DIAGNOSIS — I10 Essential (primary) hypertension: Secondary | ICD-10-CM | POA: Diagnosis not present

## 2024-12-03 DIAGNOSIS — H16292 Other keratoconjunctivitis, left eye: Secondary | ICD-10-CM | POA: Diagnosis not present
